# Patient Record
Sex: Female | Born: 1940 | Race: Black or African American | Hispanic: No | Smoking: Never smoker
Health system: Southern US, Community
[De-identification: ages and names within clinical notes are randomized; demographics above are authoritative.]

## PROBLEM LIST (undated history)

## (undated) DIAGNOSIS — Z9221 Personal history of antineoplastic chemotherapy: Secondary | ICD-10-CM

## (undated) DIAGNOSIS — R232 Flushing: Secondary | ICD-10-CM

## (undated) DIAGNOSIS — T7840XA Allergy, unspecified, initial encounter: Secondary | ICD-10-CM

## (undated) DIAGNOSIS — Z923 Personal history of irradiation: Secondary | ICD-10-CM

## (undated) DIAGNOSIS — G5600 Carpal tunnel syndrome, unspecified upper limb: Secondary | ICD-10-CM

## (undated) DIAGNOSIS — D649 Anemia, unspecified: Secondary | ICD-10-CM

## (undated) DIAGNOSIS — E119 Type 2 diabetes mellitus without complications: Secondary | ICD-10-CM

## (undated) DIAGNOSIS — K219 Gastro-esophageal reflux disease without esophagitis: Secondary | ICD-10-CM

## (undated) DIAGNOSIS — S92909A Unspecified fracture of unspecified foot, initial encounter for closed fracture: Secondary | ICD-10-CM

## (undated) DIAGNOSIS — I10 Essential (primary) hypertension: Secondary | ICD-10-CM

## (undated) DIAGNOSIS — M1990 Unspecified osteoarthritis, unspecified site: Secondary | ICD-10-CM

## (undated) DIAGNOSIS — C50919 Malignant neoplasm of unspecified site of unspecified female breast: Secondary | ICD-10-CM

## (undated) HISTORY — PX: ABDOMINAL HYSTERECTOMY: SHX81

## (undated) HISTORY — DX: Type 2 diabetes mellitus without complications: E11.9

## (undated) HISTORY — DX: Essential (primary) hypertension: I10

## (undated) HISTORY — PX: TONSILECTOMY/ADENOIDECTOMY WITH MYRINGOTOMY: SHX6125

## (undated) HISTORY — PX: CARPAL TUNNEL RELEASE: SHX101

## (undated) HISTORY — DX: Allergy, unspecified, initial encounter: T78.40XA

## (undated) HISTORY — DX: Unspecified osteoarthritis, unspecified site: M19.90

## (undated) HISTORY — PX: TUBAL LIGATION: SHX77

## (undated) HISTORY — DX: Anemia, unspecified: D64.9

## (undated) HISTORY — DX: Gastro-esophageal reflux disease without esophagitis: K21.9

## (undated) HISTORY — PX: KNEE SURGERY: SHX244

## (undated) HISTORY — DX: Carpal tunnel syndrome, unspecified upper limb: G56.00

## (undated) HISTORY — DX: Personal history of irradiation: Z92.3

## (undated) HISTORY — DX: Flushing: R23.2

## (undated) HISTORY — DX: Unspecified fracture of unspecified foot, initial encounter for closed fracture: S92.909A

## (undated) HISTORY — PX: OVARY SURGERY: SHX727

## (undated) HISTORY — PX: BACK SURGERY: SHX140

---

## 1995-05-27 ENCOUNTER — Encounter: Payer: Self-pay | Admitting: Gastroenterology

## 1995-06-28 ENCOUNTER — Encounter: Payer: Self-pay | Admitting: Gastroenterology

## 1997-12-19 ENCOUNTER — Other Ambulatory Visit: Admission: RE | Admit: 1997-12-19 | Discharge: 1997-12-19 | Payer: Self-pay | Admitting: Obstetrics and Gynecology

## 1999-03-12 ENCOUNTER — Encounter: Payer: Self-pay | Admitting: Internal Medicine

## 2000-09-01 ENCOUNTER — Ambulatory Visit (HOSPITAL_COMMUNITY): Admission: RE | Admit: 2000-09-01 | Discharge: 2000-09-01 | Payer: Self-pay | Admitting: Internal Medicine

## 2001-03-09 ENCOUNTER — Encounter: Payer: Self-pay | Admitting: Internal Medicine

## 2001-03-09 ENCOUNTER — Encounter: Admission: RE | Admit: 2001-03-09 | Discharge: 2001-03-09 | Payer: Self-pay | Admitting: Internal Medicine

## 2003-12-05 ENCOUNTER — Encounter: Admission: RE | Admit: 2003-12-05 | Discharge: 2003-12-05 | Payer: Self-pay | Admitting: Internal Medicine

## 2005-01-08 ENCOUNTER — Encounter: Admission: RE | Admit: 2005-01-08 | Discharge: 2005-01-08 | Payer: Self-pay | Admitting: Internal Medicine

## 2007-05-18 ENCOUNTER — Encounter: Admission: RE | Admit: 2007-05-18 | Discharge: 2007-05-18 | Payer: Self-pay | Admitting: Internal Medicine

## 2008-05-23 ENCOUNTER — Encounter: Admission: RE | Admit: 2008-05-23 | Discharge: 2008-05-23 | Payer: Self-pay | Admitting: Internal Medicine

## 2009-02-28 ENCOUNTER — Encounter (INDEPENDENT_AMBULATORY_CARE_PROVIDER_SITE_OTHER): Payer: Self-pay | Admitting: *Deleted

## 2009-06-10 ENCOUNTER — Encounter (INDEPENDENT_AMBULATORY_CARE_PROVIDER_SITE_OTHER): Payer: Self-pay | Admitting: *Deleted

## 2009-06-19 ENCOUNTER — Encounter: Admission: RE | Admit: 2009-06-19 | Discharge: 2009-06-19 | Payer: Self-pay | Admitting: Internal Medicine

## 2009-07-04 ENCOUNTER — Ambulatory Visit: Payer: Self-pay | Admitting: Internal Medicine

## 2009-07-04 DIAGNOSIS — Z8711 Personal history of peptic ulcer disease: Secondary | ICD-10-CM

## 2009-07-04 DIAGNOSIS — K219 Gastro-esophageal reflux disease without esophagitis: Secondary | ICD-10-CM | POA: Insufficient documentation

## 2009-07-04 DIAGNOSIS — K222 Esophageal obstruction: Secondary | ICD-10-CM

## 2009-07-04 DIAGNOSIS — D509 Iron deficiency anemia, unspecified: Secondary | ICD-10-CM

## 2009-07-04 DIAGNOSIS — E119 Type 2 diabetes mellitus without complications: Secondary | ICD-10-CM

## 2009-10-29 ENCOUNTER — Telehealth (INDEPENDENT_AMBULATORY_CARE_PROVIDER_SITE_OTHER): Payer: Self-pay | Admitting: *Deleted

## 2010-07-08 NOTE — Procedures (Signed)
Summary: [facility]  E[facility]   Imported By: Sherian Rein 07/05/2009 09:07:34  _____________________________________________________________________  External Attachment:    Type:   Image     Comment:   External Document

## 2010-07-08 NOTE — Progress Notes (Signed)
Summary: Schedule recall colonoscopy  Phone Note Outgoing Call Call back at [facility] Phone [phone]   Call placed by: Christie Nottingham CMA Duncan Dull),  Oct 29, 2009 3:19 PM Call placed to: Patient Summary of Call: Called pt to schedule recall colonoscopy and EGD per last office note in January. Pt states she just had eye surgery for macular degeneration and she cannot have the procedures right now but she will call back soon to schedule after she has recoverd from the surgery.  Initial call taken by: Christie Nottingham CMA Duncan Dull),  Oct 29, 2009 3:21 PM

## 2010-07-08 NOTE — Assessment & Plan Note (Signed)
Summary: COLONOSCOPY / GERD / history PUD   History of Present Illness Visit Type: Initial Consult Primary GI MD: Yancey Flemings MD Primary Provider: Iven Finn Requesting Provider: Iven Finn Chief Complaint: Screening for recall colonoscopy / history of ulcers / mild anemia History of Present Illness:   70 year old African American female with multiple medical problems including hypertension, hyperlipidemia, hypothyroidism, type 2 diabetes mellitus, gastroesophageal reflux disease complicated by peptic stricture requiring esophageal dilation, and irritable bowel syndrome. She was last evaluated in this office in April 2004. At that time, her reflux disease was well controlled on Protonix. Her irritable bowel syndrome was quiescent. She presents today regarding repeat screening colonoscopy. The patient last underwent colonoscopy and upper endoscopy in October of 2000. Upper endoscopy revealed a ringlike stricture which was dilated. Colonoscopy revealed sigmoid diverticulosis and internal hemorrhoids. The patient's GI review of systems is remarkable for multiple intermittent complaints. She uses Nexium sporadically with intermittent problems with heartburn or regurgitation. No recurrent dysphagia. Intermittent problems with abdominal bloating gas and diarrhea. No GI bleeding. She does questions regarding the use of NSAIDs and her history of peptic ulcer disease. 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 No further details. Review of outside laboratories from June 03, 2009 revealed microcytic anemia with a hematocrit of 36.3. She denies melena or hematochezia. She does use ibuprofen and meloxicam for her arthritis. Laboratory from 2003 reveal a similar hemoglobin and MCV.   GI Review of Systems    Reports acid reflux, belching, bloating, heartburn, and  nausea.      Denies abdominal pain, chest pain, dysphagia with liquids, dysphagia with  solids, loss of appetite, vomiting, vomiting blood, weight loss, and  weight gain.      Reports diarrhea, diverticulosis, and  irritable bowel syndrome.     Denies anal fissure, black tarry stools, change in bowel habit, constipation, fecal incontinence, heme positive stool, hemorrhoids, jaundice, light color stool, liver problems, rectal bleeding, and  rectal pain.    Current Medications (verified): 1)  Ortho-Est 0.625 0.75 Mg Tabs (Estropipate) .... 1/2 Tablet By Mouth Once Daily 2)  Lisinopril-Hydrochlorothiazide 20-12.5 Mg Tabs (Lisinopril-Hydrochlorothiazide) .Marland Kitchen.. 1 By Mouth Once Daily 3)  Metformin Hcl 500 Mg Tabs (Metformin Hcl) .Marland Kitchen.. 1 By Mouth Once Daily 4)  Lipitor 40 Mg Tabs (Atorvastatin Calcium) .Marland Kitchen.. 1 By Mouth Once Daily 5)  Amlodipine Besylate 5 Mg Tabs (Amlodipine Besylate) .Marland Kitchen.. 1 By Mouth Once Daily 6)  Xalatan 0.005 % Soln (Latanoprost) .Marland Kitchen.. 1 Gtt Per Eye Once Daily 7)  Nexium 40 Mg Cpdr (Esomeprazole Magnesium) .... As Needed 8)  Fexofenadine Hcl 180 Mg Tabs (Fexofenadine Hcl) .... As Needed 9)  Meloxicam 15 Mg Tabs (Meloxicam) .Marland Kitchen.. 1 By Mouth Once Daily 10)  Aspirin 81 Mg Tbec (Aspirin) .Marland Kitchen.. 1 By Mouth Once Daily 11)  Ibuprofen 200 Mg Tabs (Ibuprofen) .... 2 By Mouth Four Times Daily 12)  Magnesium 250 Mg Tabs (Magnesium) .Marland Kitchen.. 1 By Mouth Once Daily  Allergies (verified): 1)  ! * Tb 2)  ! * Horse Serum  Past History:  Past Medical History: Arthritis Chronic Headaches Hyperlipidemia Hypertension Hypothyroidism GERD Diverticulosis Hemorrhoids Irritable Bowel Syndrome Esophageal Stricture Diabetes Glaucoma ulcers in GI tract 1965 Urinary Tract Infection 1965  Family History: Family History of Diabetes: mother, grandmtother,brother Family History of Irritable Bowel Syndrome:  Social History: Occupation: retired widowed Patient has never smoked.  Alcohol Use - no Illicit Drug Use - no Daily Caffeine Use  cokes Smoking Status:  never Drug Use:   no  Review of Systems       sinus had allergy trouble, arthritis, back pain, visual change, cough, night sweats, sleeping problems. All other systems reviewed and reported as negative  Vital Signs:  Patient profile:   70 year old female Height:      62 inches Weight:      224 pounds BMI:     41.12 BSA:     2.01 Pulse rate:   88 / minute Pulse rhythm:   regular BP sitting:   140 / 88  (left arm)  Vitals Entered By: Merri Ray CMA Duncan Dull) (July 04, 2009 10:05 AM)  Physical Exam  General:  Well developed, obese,well nourished, no acute distress. Head:  Normocephalic and atraumatic. Eyes:  PERRLA, no icterus.cataract present Nose:  No deformity, discharge,  or lesions. Mouth:  No deformity or lesions,  Neck:  Supple; no masses or thyromegaly. Lungs:  Clear throughout to auscultation. Heart:  Regular rate and rhythm; no murmurs, rubs,  or bruits. Abdomen:  Soft, obese,nontender and nondistended. No masses, hepatosplenomegaly or hernias noted. Normal bowel sounds. Rectal:  deferred until procedure Msk:  Symmetrical with no gross deformities. Normal posture. Pulses:  Normal pulses noted. Extremities:  No clubbing, cyanosis, edema or deformities noted. Neurologic:  Alert and  oriented x4;  grossly normal neurologically. Skin:  Intact without significant lesions or rashes. Cervical Nodes:  No significant cervical adenopathy.no supraclavicular adenopathy Psych:  Alert and cooperative. Normal mood and affect.   Impression & Recommendations:  Problem # 1:  ANEMIA-IRON DEFICIENCY (ICD-280.9) Mild microcytic anemia. Rule out occult GI mucosal lesion such as colorectal neoplasia, AVMs, or more likely NSAID induced lesions.  Plan: #1. Colonoscopy. The nature procedure as well as the risks, benefits, and alternatives were reviewed. She understood and agreed to proceed. #2. Movi prep to be prescribed. #3. Metoclopramide 10 mg before each session to be prescribed #4. Upper  endoscopy. The patient procedure as well as risks, benefits, and alternatives were reviewed. She understood and agreed to proceed. #5.hold diabetic medications the day of the procedure in order to avoid unwanted hypoglycemia. We will monitor her blood sugar immediately before and after her procedures. #6. The patient wishes to schedule her procedures in a few months. She will contact the office to arrange a visit with our pre-visit nurse.  Problem # 2:  PERSONAL HISTORY OF PEPTIC ULCER DISEASE (ICD-V12.71) This is an important issue in a 70 year old with multiple comorbidities who uses chronic NSAIDs. With her microcytic anemia, GI mucosal lesions secondary to NSAIDs should be excluded. I discussed with her in detail with the literature regarding concurrent use of PPIs with NSAIDs in patient with history of ulcer disease. Recommend following: #1. At the time of upper endoscopy rule out Helicobacter pylori and treat if positive #2. Could minister PPI therapy daily with NSAID use. Consider selective NSAIDs.  Problem # 3:  GERD (ICD-530.81) chronic GERD with a history of peptic stricture. No recurrent dysphagia post dilation.  Plan: #1. Daily PPI therapy #2. Reflux precautions #3. Esophageal dilation p.r.n. recurrent dysphagia  Problem # 4:  DIABETES MELLITUS-TYPE II (ICD-250.00) management a diabetic medications preprocedure. please see #1 above  Problem # 5:  ESOPHAGEAL STRICTURE (ICD-530.3) remains asymptomatic post dilation  Problem # 6:  SCREENING COLORECTAL-CANCER (ICD-V76.51) negative colonoscopy 10 years ago. She is an appropriate candidate for repeat screening at this time. See discussion and plan under #1 above regarding her colonoscopy.  Patient Instructions:  1)  Pt. will call office and schedule Colon/Endo. 2)  The medication list was reviewed and reconciled.  All changed / newly prescribed medications were explained.  A complete medication list was provided to the patient /  caregiver. 3)  Copy: Dr. Juline Patch

## 2010-07-08 NOTE — Procedures (Signed)
Summary: [facility]  [facility]oscopy/[facility]   Imported By: Sherian Rein 07/05/2009 09:09:07  _____________________________________________________________________  External Attachment:    Type:   Image     Comment:   External Document

## 2010-07-08 NOTE — Procedures (Signed)
Summary: EGD/[facility]  EGD/[facility]   Imported By: Sherian Rein 07/05/2009 09:12:39  _____________________________________________________________________  External Attachment:    Type:   Image     Comment:   External Document

## 2010-07-08 NOTE — Procedures (Signed)
Summary: [facility]   Imported By: Sherian Rein 07/05/2009 09:10:43  _____________________________________________________________________  External Attachment:    Type:   Image     Comment:   External Document

## 2010-07-08 NOTE — Letter (Signed)
Summary: New Patient letter  [facility]  [address] [place], [place] [address]   Phone: [phone]  Fax: [phone]       06/10/2009 MRN: [id number]  QUINLAN MCFALL [address] [place], [place]  [address]  [place]  Dear Ms. Daphine Deutscher,  Welcome to the Gastroenterology Division at [facility].    You are scheduled to see Dr.  Marina Goodell on 07-03-2009 at 11am on the 3rd floor at [facility], [address].  We ask that you try to arrive at our office 15 minutes prior to your appointment time to allow for check-in.  We would like you to complete the enclosed self-administered evaluation form prior to your visit and bring it with you on the day of your appointment.  We will review it with you.  Also, please bring a complete list of all your medications or, if you prefer, bring the medication bottles and we will list them.  Please bring your insurance card so that we may make a copy of it.  If your insurance requires a referral to see a specialist, please bring your referral form from your primary care physician.  Co-payments are due at the time of your visit and may be paid by cash, check or credit card.     Your office visit will consist of a consult with your physician (includes a physical exam), any laboratory testing he/she may order, scheduling of any necessary diagnostic testing (e.g. x-ray, ultrasound, CT-scan), and scheduling of a procedure (e.g. Endoscopy, Colonoscopy) if required.  Please allow enough time on your schedule to allow for any/all of these possibilities.    If you cannot keep your appointment, please call [phone] to cancel or reschedule prior to your appointment date.  This allows [place] the opportunity to schedule an appointment for another patient in need of care.  If you do not cancel or reschedule by 5 p.m. the business day prior to your appointment date, you will be charged a $50.00 late cancellation/no-show fee.    Thank you for choosing  [facility] for your medical needs.  We appreciate the opportunity to care for you.  Please visit [place] at our website  to learn more about our practice.                     Sincerely,                                                             The Gastroenterology Division

## 2010-07-15 ENCOUNTER — Encounter (INDEPENDENT_AMBULATORY_CARE_PROVIDER_SITE_OTHER): Payer: Self-pay | Admitting: *Deleted

## 2010-07-24 NOTE — Letter (Signed)
Summary: Pre Visit Letter Revised  [facility]  [address] [place], [place] [address]   Phone: [phone]  Fax: [phone]        07/15/2010 MRN: [id number] Shelley Lawrence [address] [place], [place]  [address]  [place]             Procedure Date:  08/11/2010 @ 10:30   Recall colon-Dr. Marina Goodell   Welcome to the Gastroenterology Division at [facility].    You are scheduled to see a nurse for your pre-procedure visit on 07/29/2010 at 3:30 on the 3rd floor at [facility], [address].  We ask that you try to arrive at our office 15 minutes prior to your appointment time to allow for check-in.  Please take a minute to review the attached form.  If you answer "Yes" to one or more of the questions on the first page, we ask that you call the person listed at your earliest opportunity.  If you answer "No" to all of the questions, please complete the rest of the form and bring it to your appointment.    Your nurse visit will consist of discussing your medical and surgical history, your immediate family medical history, and your medications.   If you are unable to list all of your medications on the form, please bring the medication bottles to your appointment and we will list them.  We will need to be aware of both prescribed and over the counter drugs.  We will need to know exact dosage information as well.    Please be prepared to read and sign documents such as consent forms, a financial agreement, and acknowledgement forms.  If necessary, and with your consent, a friend or relative is welcome to sit-in on the nurse visit with you.  Please bring your insurance card so that we may make a copy of it.  If your insurance requires a referral to see a specialist, please bring your referral form from your primary care physician.  No co-pay is required for this nurse visit.     If you cannot keep your appointment, please call [phone] to cancel or reschedule prior to  your appointment date.  This allows [place] the opportunity to schedule an appointment for another patient in need of care.    Thank you for choosing [facility] for your medical needs.  We appreciate the opportunity to care for you.  Please visit [place] at our website  to learn more about our practice.  Sincerely, The Gastroenterology Division

## 2010-07-28 ENCOUNTER — Encounter (INDEPENDENT_AMBULATORY_CARE_PROVIDER_SITE_OTHER): Payer: Self-pay | Admitting: *Deleted

## 2010-07-29 ENCOUNTER — Encounter: Payer: Self-pay | Admitting: Internal Medicine

## 2010-07-29 ENCOUNTER — Encounter (INDEPENDENT_AMBULATORY_CARE_PROVIDER_SITE_OTHER): Payer: Self-pay | Admitting: *Deleted

## 2010-08-05 NOTE — Miscellaneous (Signed)
Summary: [facility] Previsit/prep  Clinical Lists Changes  Medications: Added new medication of MOVIPREP 100 GM  SOLR (PEG-KCL-NACL-NASULF-NA ASC-C) As per prep instructions. - Signed Added new medication of METOCLOPRAMIDE HCL 10 MG  TABS (METOCLOPRAMIDE HCL) As per prep instructions. - Signed Rx of MOVIPREP 100 GM  SOLR (PEG-KCL-NACL-NASULF-NA ASC-C) As per prep instructions.;  #1 x 0;  Signed;  Entered by: Wyona Almas RN;  Authorized by: Hilarie Fredrickson MD;  Method used: Electronically to [facility] [address] [place], [place], [place]  [address], Ph: [phone], Fax: [phone] Rx of METOCLOPRAMIDE HCL 10 MG  TABS (METOCLOPRAMIDE HCL) As per prep instructions.;  #2 x 0;  Signed;  Entered by: Wyona Almas RN;  Authorized by: Hilarie Fredrickson MD;  Method used: Electronically to [facility] [address] [place], [place], [place]  [address], Ph: [phone], Fax: [phone] Allergies: Changed allergy or adverse reaction from * HORSE SERUM to * HORSE SERUM Changed allergy or adverse reaction from * TB to * TB    Prescriptions: METOCLOPRAMIDE HCL 10 MG  TABS (METOCLOPRAMIDE HCL) As per prep instructions.  #2 x 0   Entered by:   Wyona Almas RN   Authorized by:   Hilarie Fredrickson MD   Signed by:   Wyona Almas RN on 07/29/2010   Method used:   Electronically to        [facility] (retail)       [address]       [place], [place]  [address]       Ph: [phone]       Fax: [phone]   RxID:   [id number] MOVIPREP 100 GM  SOLR (PEG-KCL-NACL-NASULF-NA ASC-C) As per prep instructions.  #1 x 0   Entered by:   Wyona Almas RN   Authorized by:   Hilarie Fredrickson MD   Signed by:   Wyona Almas RN on 07/29/2010   Method used:   Electronically to        [facility] (retail)       [address]       [place], [place]  [address]       Ph: [phone]       Fax: [phone]   RxID:    [id number]

## 2010-08-05 NOTE — Letter (Signed)
Summary: [facility] Instructions  [facility]  [address] [place], [place] [address]   Phone: [phone]  Fax: [phone]       Shelley Lawrence    01-Aug-1940    MRN: [id number]        Procedure Day Dorna Bloom:  Duanne Limerick  08/11/10     Arrival Time:  9:30AM     Procedure Time:  10:30AM     Location of Procedure:                    Juliann Pares _  [facility] (4th Floor)                      PREPARATION FOR COLONOSCOPY WITH MOVIPREP   Starting 5 days prior to your procedure 08/06/10 do not eat nuts, seeds, popcorn, corn, beans, peas,  salads, or any raw vegetables.  Do not take any fiber supplements (e.g. Metamucil, Citrucel, and Benefiber).  THE DAY BEFORE YOUR PROCEDURE         DATE: 08/10/10  DAY: SUNDAY  1.  Drink clear liquids the entire day-NO SOLID FOOD  2.  Do not drink anything colored red or purple.  Avoid juices with pulp.  No orange juice.  3.  Drink at least 64 oz. (8 glasses) of fluid/clear liquids during the day to prevent dehydration and help the prep work efficiently.  CLEAR LIQUIDS INCLUDE: Water Jello Ice Popsicles Tea (sugar ok, no milk/cream) Powdered fruit flavored drinks Coffee (sugar ok, no milk/cream) Gatorade Juice: apple, white grape, white cranberry  Lemonade Clear bullion, consomm, broth Carbonated beverages (any kind) Strained chicken noodle soup Hard Candy                             4.  In the morning, mix first dose of MoviPrep solution:    Empty 1 Pouch A and 1 Pouch B into the disposable container    Add lukewarm drinking water to the top line of the container. Mix to dissolve    Refrigerate (mixed solution should be used within 24 hrs)  Take Reglan at 4:30PM 5.  Begin drinking the prep at 5:00 p.m. The MoviPrep container is divided by 4 marks.   Every 15 minutes drink the solution down to the next mark (approximately 8 oz) until the full liter is complete.   6.  Follow completed prep with 16 oz of clear liquid of  your choice (Nothing red or purple).  Continue to drink clear liquids until bedtime.  7.  Before going to bed, mix second dose of MoviPrep solution:    Empty 1 Pouch A and 1 Pouch B into the disposable container    Add lukewarm drinking water to the top line of the container. Mix to dissolve    Refrigerate  THE DAY OF YOUR PROCEDURE      DATE: 08/11/10  DAY: MONDAY  Take Reglan at 5:00AM Beginning at 5:30AM (5 hours before procedure):         1. Every 15 minutes, drink the solution down to the next mark (approx 8 oz) until the full liter is complete.  2. Follow completed prep with 16 oz. of clear liquid of your choice.    3. You may drink clear liquids until 8:30AM (2 HOURS BEFORE PROCEDURE).   MEDICATION INSTRUCTIONS  Unless otherwise instructed, you should take regular prescription medications with a small sip of water  as early as possible the morning of your procedure.  Diabetic patients - see separate instructions.   Additional medication instructions: Hold Lisinopril/HCTZ the morning of procedure.         OTHER INSTRUCTIONS  You will need a responsible adult at least 69 years of age to accompany you and drive you home.   This person must remain in the waiting room during your procedure.  Wear loose fitting clothing that is easily removed.  Leave jewelry and other valuables at home.  However, you may wish to bring a book to read or  an iPod/MP3 player to listen to music as you wait for your procedure to start.  Remove all body piercing jewelry and leave at home.  Total time from sign-in until discharge is approximately 2-3 hours.  You should go home directly after your procedure and rest.  You can resume normal activities the  day after your procedure.  The day of your procedure you should not:   Drive   Make legal decisions   Operate machinery   Drink alcohol   Return to work  You will receive specific instructions about eating, activities and  medications before you leave.    The above instructions have been reviewed and explained to me by   Wyona Almas RN  July 29, 2010 11:41 AM     I fully understand and can verbalize these instructions _____________________________ Date _________

## 2010-08-05 NOTE — Letter (Signed)
Summary: Diabetic Instructions  [facility]  [address] [place], [place] [address]   Phone: [phone]  Fax: [phone]    Shelley Lawrence August 28, 1940 MRN: [id number]   _x  _   ORAL DIABETIC MEDICATION INSTRUCTIONS  The day before your procedure:   Take your diabetic pill as you do normally  The day of your procedure:   Do not take your diabetic pill    We will check your blood sugar levels during the admission process and again in Recovery before discharging you home  ________________________________________________________________________

## 2010-08-11 ENCOUNTER — Other Ambulatory Visit: Payer: Self-pay | Admitting: Internal Medicine

## 2010-08-11 ENCOUNTER — Encounter: Payer: Self-pay | Admitting: Internal Medicine

## 2010-08-11 ENCOUNTER — Encounter (AMBULATORY_SURGERY_CENTER): Payer: No Typology Code available for payment source | Admitting: Internal Medicine

## 2010-08-11 DIAGNOSIS — Z1211 Encounter for screening for malignant neoplasm of colon: Secondary | ICD-10-CM

## 2010-08-11 DIAGNOSIS — K219 Gastro-esophageal reflux disease without esophagitis: Secondary | ICD-10-CM

## 2010-08-11 DIAGNOSIS — K5730 Diverticulosis of large intestine without perforation or abscess without bleeding: Secondary | ICD-10-CM

## 2010-08-11 DIAGNOSIS — D509 Iron deficiency anemia, unspecified: Secondary | ICD-10-CM

## 2010-08-11 LAB — GLUCOSE, CAPILLARY
Glucose-Capillary: 126 mg/dL — ABNORMAL HIGH (ref 70–99)
Glucose-Capillary: 115 mg/dL — ABNORMAL HIGH (ref 70–99)

## 2010-08-13 ENCOUNTER — Telehealth: Payer: Self-pay | Admitting: Internal Medicine

## 2010-08-18 ENCOUNTER — Telehealth: Payer: Self-pay | Admitting: Internal Medicine

## 2010-08-19 NOTE — Miscellaneous (Signed)
Summary: Orders Update  Clinical Lists Changes  Orders: Added new Test order of TLB-H Pylori Screen Gastric Biopsy (83013-CLOTEST) - Signed 

## 2010-08-19 NOTE — Progress Notes (Signed)
Summary: Med refill  Phone Note Call from Patient Call back at Home Phone [phone]   Caller: Patient Call For: Dr. Marina Goodell Reason for Call: Talk to Nurse Summary of Call: patient needs [place] to write her a 90 day RX for Neixum so she can send it to her mail order pharmacy because the regular pharmacy is too expensive Initial call taken by: [place] Johnson,  August 13, 2010 3:47 PM    Prescriptions: NEXIUM 40 MG CPDR (ESOMEPRAZOLE MAGNESIUM) as needed  #90 x 3   Entered by:   Milford Cage NCMA   Authorized by:   Hilarie Fredrickson MD   Signed by:   Milford Cage NCMA on 08/13/2010   Method used:   Print then Give to Patient   RxID:   [id number]   Appended Document: Med refill called patient to pick up prescription at front desk.

## 2010-08-19 NOTE — Miscellaneous (Signed)
Summary: Helidac for 2 weeks  Clinical Lists Changes  Medications: Added new medication of HELIDAC   MISC (METRONID-TETRACYC-BIS SUBSAL) As directed for 2 weeks Observations: Added new observation of ALLERGY REV: Done (08/15/2010 16:44)

## 2010-08-19 NOTE — Procedures (Signed)
Summary: Colonoscopy  Patient: [name] Note: All result statuses are Final unless otherwise noted.  Tests: (1) Colonoscopy (COL)   COL Colonoscopy           DONE     [facility]     [address]     [place], [place]  [address]          COLONOSCOPY PROCEDURE REPORT          PATIENT:  Shelley Lawrence, Shelley Lawrence  MR#:  [id number]     BIRTHDATE:  Mar 12, 1941, 69 yrs. old  GENDER:  female     ENDOSCOPIST:  Wilhemina Bonito. Eda Keys, MD     REF. BY:  Screening Recall     PROCEDURE DATE:  08/11/2010     PROCEDURE:  Average-risk screening colonoscopy     G0121     ASA CLASS:  Class II     INDICATIONS:  Routine Risk Screening ; negative index exam 2000     MEDICATIONS:   Fentanyl 50 mcg IV, Versed 6 mg IV          DESCRIPTION OF PROCEDURE:   After the risks benefits and     alternatives of the procedure were thoroughly explained, informed     consent was obtained.  Digital rectal exam was performed and     revealed no abnormalities.   The LB 180AL [id number] endoscope was     introduced through the anus and advanced to the cecum, which was     identified by both the appendix and ileocecal valve, without     limitations.Time to cecum = 2:29 min.  The quality of the prep was     excellent, using MoviPrep.  The instrument was then slowly     withdrawn (time = 8:41 min) as the colon was fully examined.     <<PROCEDUREIMAGES>>          FINDINGS:  Moderate diverticulosis was found in the sigmoid colon.     Otherwise normal colonoscopy without polyps, masses, vascular     ectasias, or inflammatory changes.   Retroflexed views in the     rectum revealed internal hemorrhoids.    The scope was then     withdrawn from the patient and the procedure completed.          COMPLICATIONS:  None          ENDOSCOPIC IMPRESSION:     1) Moderate diverticulosis in the sigmoid colon     2) Otherwise normal colonoscopy     3) Internal hemorrhoids     RECOMMENDATIONS:     1) Continue current colorectal  screening recommendations for     "routine risk" patients with a repeat colonoscopy in 10 years.          ______________________________     Wilhemina Bonito. Eda Keys, MD          CC:  Juline Patch, MD; The Patient          n.     eSIGNED:   Wilhemina Bonito. Eda Keys at 08/11/2010 11:16 AM          Alvera Singh, [id number]  Note: An exclamation mark (!) indicates a result that was not dispersed into the flowsheet. Document Creation Date: 08/11/2010 11:16 AM _______________________________________________________________________  (1) Order result status: Final Collection or observation date-time: 08/11/2010 11:07 Requested date-time:  Receipt date-time:  Reported date-time:  Referring Physician:   Ordering Physician: Fransico Setters [phone]) Specimen Source:  Source:  [employer] Order Number: [id number] Lab site:   Appended Document: Colonoscopy    Clinical Lists Changes  Observations: Added new observation of COLONNXTDUE: 08/2020 (08/11/2010 12:07)

## 2010-08-19 NOTE — Procedures (Signed)
Summary: Upper Endoscopy  Patient: Shelley Lawrence Note: All result statuses are Final unless otherwise noted.  Tests: (1) Upper Endoscopy (EGD)   EGD Upper Endoscopy       DONE     [facility]     [address]     [place], [place]  [address]          ENDOSCOPY PROCEDURE REPORT          PATIENT:  [name], [name]  MR#:  [id number]     BIRTHDATE:  1940/09/30, 69 yrs. old  GENDER:  female          ENDOSCOPIST:  Wilhemina Bonito. Eda Keys, MD     Referred by:  Office [phone])          PROCEDURE DATE:  08/11/2010     PROCEDURE:  EGD with biopsy, 130[address]     ASA CLASS:  Class II     INDICATIONS:  GERD, microcytic anemia ; hx PUD          MEDICATIONS:   There was residual sedation effect present from     prior procedure., Fentanyl 50 mcg IV, Versed 3 mg IV     TOPICAL ANESTHETIC:  Exactacain Spray          DESCRIPTION OF PROCEDURE:   After the risks benefits and     alternatives of the procedure were thoroughly explained, informed     consent was obtained.  The LB GIF-H180 [id number] endoscope was     introduced through the mouth and advanced to the second portion of     the duodenum, without limitations.  The instrument was slowly     withdrawn as the mucosa was fully examined.     <<PROCEDUREIMAGES>>          The upper, middle, and distal third of the esophagus were     carefully inspected and no abnormalities were noted. The z-line     was well seen at the GEJ. The endoscope was pushed into the fundus     which was normal including a retroflexed view. The antrum,gastric     body, first and second part of the duodenum were unremarkable.     Retroflexed views revealed no abnormalities.  Clo bx taken given hx     PUD. The scope was then withdrawn from the patient and the     procedure completed.          COMPLICATIONS:  None          ENDOSCOPIC IMPRESSION:     1) Normal EGD     2) GERD          RECOMMENDATIONS:     1) Anti-reflux regimen to be followed     2)  Continue PPI - NEXIUM 40MG ; #90; ONE PO QD; 1 YR REFILLS     3) Rx CLO if positive          ______________________________     Wilhemina Bonito. Eda Keys, MD          CC:  Juline Patch, MD;  The Patient          n.     eSIGNED:   Wilhemina Bonito. Eda Keys at 08/11/2010 11:26 AM          Alvera Singh, [id number]  Note: An exclamation mark (!) indicates a result that was not dispersed into the flowsheet. Document Creation Date: 08/11/2010 11:26 AM _______________________________________________________________________  (1) Order result status: Final Collection or observation  date-time: 08/11/2010 11:20 Requested date-time:  Receipt date-time:  Reported date-time:  Referring Physician:   Ordering Physician: Fransico Setters [phone]) Specimen Source:  Source: [employer] Order Number: [id number] Lab site:

## 2010-08-20 ENCOUNTER — Telehealth: Payer: Self-pay | Admitting: Physician Assistant

## 2010-08-26 NOTE — Progress Notes (Signed)
Summary: Medication  Phone Note From Pharmacy   Caller: [facility] [phone] Call For: Dr. Marina Goodell  Summary of Call: [facility] is not covered by Ins.Marland Kitchen..Marland Kitchenneeds an alternative med Initial call taken by: Karna Christmas,  August 20, 2010 2:30 PM     Appended Document: Medication Spoke with Mike Gip, PA and ok to call in Pylera for patient to take as directed X 10 days.   Clinical Lists Changes  Medications: Added new medication of PYLERA 140-125-125 MG CAPS (BIS SUBCIT-METRONID-TETRACYC) Take as directed for 10 days - Signed Rx of PYLERA 140-125-125 MG CAPS (BIS SUBCIT-METRONID-TETRACYC) Take as directed for 10 days;  #10 days x 0;  Signed;  Entered by: Selinda Michaels RN;  Authorized by: Sammuel Cooper PA-c;  Method used: Electronically to [facility] [address], [place], [place], [place]  [address], Ph: [phone], Fax: [phone]    Prescriptions: PYLERA 140-125-125 MG CAPS (BIS SUBCIT-METRONID-TETRACYC) Take as directed for 10 days  #10 days x 0   Entered by:   Selinda Michaels RN   Authorized by:   Sammuel Cooper PA-c   Signed by:   Selinda Michaels RN on 08/20/2010   Method used:   Electronically to        [facility] (retail)       [address]       [place], [place]  [address]       Ph: [phone]       Fax: [phone]   RxID:   [id number]    Appended Document: Medication Pharmacy calling back and her insurance will not cover Pylera either. Pharmacy suggests breaking down the prescription into individual meds in generic. Please advise  Appended Document: Medication OK, CALL IN TCN 500MG  4 X DAILY X 10 DAYS,AND METRONIDAZOLE 250 MG 4 X 10 DAYS, AND STAY ON HER PPI.  Appended Document: Medication OK, CALL IN TCN 500MG  4 X DAILY X 10 DAYS,AND METRONIDAZOLE 250 MG 4 X 10 DAYS, AND STAY ON HER PPI.   Signed by Sammuel Cooper PA-c on 08/21/2010 at 9:35 AM    Clinical Lists Changes  Medications: Added new  medication of TETRACYCLINE HCL 500 MG CAPS (TETRACYCLINE HCL) Take 1 by mouth four times a day for 10 days - Signed Added new medication of METRONIDAZOLE 250 MG TABS (METRONIDAZOLE) Take 1 by mouth four times a day for 10 days - Signed Rx of TETRACYCLINE HCL 500 MG CAPS (TETRACYCLINE HCL) Take 1 by mouth four times a day for 10 days;  #40 x 0;  Signed;  Entered by: Selinda Michaels RN;  Authorized by: Sammuel Cooper PA-c;  Method used: Electronically to [facility] [address] [place], [place], [place]  [address], Ph: [phone], Fax: [phone] Rx of METRONIDAZOLE 250 MG TABS (METRONIDAZOLE) Take 1 by mouth four times a day for 10 days;  #40 x 0;  Signed;  Entered by: Selinda Michaels RN;  Authorized by: Sammuel Cooper PA-c;  Method used: Electronically to [facility] [address] [place], [place], [place]  [address], Ph: [phone], Fax: [phone]    Prescriptions: METRONIDAZOLE 250 MG TABS (METRONIDAZOLE) Take 1 by mouth four times a day for 10 days  #40 x 0   Entered by:   Selinda Michaels RN   Authorized by:   Sammuel Cooper PA-c   Signed by:   Selinda Michaels RN  on 08/21/2010   Method used:   [facility]        [facility] ([facility]ail[facility]       [address]       [place], [place]  [address]       Ph: [phone]       Fax: [phone]   RxID:   [id number] TETRACYCLINE HCL 500 MG CAPS (TETRACYCLINE HCL) Take 1 by mouth four times a day for 10 days  #40 x 0   Entered by:   Selinda Michaels RN   Authorized by:   Sammuel Cooper PA-c   Signed by:   Selinda Michaels RN on 08/21/2010   Method used:   Electronically to        [facility] (retail)       [address]       [place], [place]  [address]       Ph: [phone]       Fax: [phone]   RxID:   [id number]

## 2010-08-26 NOTE — Progress Notes (Signed)
Summary: rx not in her pharmacy  Phone Note Call from Patient Call back at Home Phone [phone]   Caller: Patient Call For: Dr Marina Goodell Reason for Call: Talk to Nurse Summary of Call: H-Pylori meds are not in her pharmacy please resend. Initial call taken by: Tawni Levy,  August 18, 2010 1:55 PM  Follow-up for Phone Call        called patient and she had called [facility] and Rx. for Helidac was not sent in.  I looked on pathology report and it was instructed by Dr. Marina Goodell for it to be called in.  Not sure how she wants this to be taken.  [facility]  Thanks.    Follow-up by: Milford Cage NCMA,  August 18, 2010 2:51 PM  Additional Follow-up for Phone Call Additional follow up Details #1::        rx was telephoned to [facility]. Additional Follow-up by: Doristine Church RN II,  August 20, 2010 2:05 PM    Prescriptions: HELIDAC   MISC (METRONID-TETRACYC-BIS SUBSAL) As directed for 2 weeks  #qs x 0   Entered by:   Doristine Church RN II   Authorized by:   Hilarie Fredrickson MD   Signed by:   Doristine Church RN II on 08/20/2010   Method used:   Telephoned to ...       [facility] (retail)       [address]       [place], [place]  [address]       Ph: [phone]       Fax: [phone]   RxID:   [id number]

## 2010-08-28 ENCOUNTER — Telehealth: Payer: Self-pay | Admitting: Internal Medicine

## 2010-08-28 NOTE — Telephone Encounter (Signed)
Shelley Lawrence-this was sent to me.  Please review-not sure what to do with it.  Thanks.

## 2010-08-28 NOTE — Telephone Encounter (Signed)
Patient states that she has not been able to get her Tetracycline Rx because the pharmacy does not have it. Called the [place] [facility] pharmacy on [address] and they have the 250mg  dose. Rx called in for patient at the [facility] on [address]. Tetracycline 250mg  po four times a day for 10days. Pt aware.

## 2010-10-29 ENCOUNTER — Telehealth: Payer: Self-pay | Admitting: Internal Medicine

## 2010-10-29 NOTE — Telephone Encounter (Signed)
You could try twice a day PPI x2 weeks, Biaxin 500 mg twice a day x2 weeks, and amoxicillin 1 g twice a day x2 weeks. Doublecheck for allergies

## 2010-10-29 NOTE — Telephone Encounter (Signed)
Will call after 2 pm. Tetracycline was ordered on 08/28/10 for 2 weeks. I don't know if pt delayed starting the drug or what.

## 2010-10-29 NOTE — Telephone Encounter (Signed)
Pt states she was late starting on the medication for the H Pylori but she started taking the tetracycline and flagyl on Sunday. She took it 3 days and was nauseated and throwing up. Pt stopped the med after Tuesday. Pt states she cannot take that medicine. Pt wants to know what else she can take. Please advise.

## 2010-10-30 MED ORDER — AMOXICILLIN 500 MG PO CAPS: 1000.0000 mg | ORAL_CAPSULE | Freq: Two times a day (BID) | ORAL | Status: AC

## 2010-10-30 MED ORDER — CLARITHROMYCIN 500 MG PO TABS: 500.0000 mg | ORAL_TABLET | Freq: Two times a day (BID) | ORAL | Status: DC

## 2010-10-30 MED ORDER — AMOXICILLIN 500 MG PO CAPS: 1000.0000 mg | ORAL_CAPSULE | Freq: Two times a day (BID) | ORAL | Status: DC

## 2010-10-30 MED ORDER — CLARITHROMYCIN 500 MG PO TABS: 500.0000 mg | ORAL_TABLET | Freq: Two times a day (BID) | ORAL | Status: AC

## 2010-10-30 NOTE — Telephone Encounter (Signed)
Addended by: Chrystie Nose R on: 10/30/2010 01:42 PM   Modules accepted: Orders

## 2010-10-30 NOTE — Telephone Encounter (Signed)
Pt aware of Dr. Lamar Sprinkles recommendations and new rx's sent to the pharmacy.

## 2010-11-04 ENCOUNTER — Telehealth: Payer: Self-pay | Admitting: Internal Medicine

## 2010-11-04 NOTE — Telephone Encounter (Signed)
Pt states prescriptions were not at the pharmacy. Biaxin and amoxicillin scripts called to [facility] pharmacy.

## 2011-02-19 ENCOUNTER — Other Ambulatory Visit: Payer: Self-pay | Admitting: Internal Medicine

## 2011-02-19 DIAGNOSIS — Z1231 Encounter for screening mammogram for malignant neoplasm of breast: Secondary | ICD-10-CM

## 2011-02-25 ENCOUNTER — Ambulatory Visit
Admission: RE | Admit: 2011-02-25 | Discharge: 2011-02-25 | Disposition: A | Discharge status: Home / Self Care | Payer: No Typology Code available for payment source | Source: Ambulatory Visit | Attending: Internal Medicine | Admitting: Internal Medicine

## 2011-02-25 DIAGNOSIS — Z1231 Encounter for screening mammogram for malignant neoplasm of breast: Secondary | ICD-10-CM

## 2011-08-11 ENCOUNTER — Telehealth: Payer: Self-pay | Admitting: Internal Medicine

## 2011-08-12 ENCOUNTER — Other Ambulatory Visit: Payer: Self-pay

## 2011-08-12 MED ORDER — ESOMEPRAZOLE MAGNESIUM 40 MG PO CPDR: 40.0000 mg | DELAYED_RELEASE_CAPSULE | Freq: Every day | ORAL | Status: DC

## 2011-08-17 NOTE — Telephone Encounter (Signed)
rx faxed; encounter closed

## 2012-10-28 ENCOUNTER — Telehealth: Payer: Self-pay

## 2012-10-28 MED ORDER — ESOMEPRAZOLE MAGNESIUM 40 MG PO CPDR: 40.0000 mg | DELAYED_RELEASE_CAPSULE | Freq: Every day | ORAL | Status: AC

## 2012-10-28 NOTE — Telephone Encounter (Signed)
Refilled Nexium 

## 2012-11-02 ENCOUNTER — Telehealth: Payer: Self-pay

## 2012-11-02 NOTE — Telephone Encounter (Signed)
Denied Nexium refill request - patient needs office visit for further refills, which was communicated last time I refilled it

## 2012-11-22 ENCOUNTER — Ambulatory Visit (INDEPENDENT_AMBULATORY_CARE_PROVIDER_SITE_OTHER): Payer: Medicare HMO | Admitting: Podiatry

## 2012-11-22 ENCOUNTER — Encounter: Payer: Self-pay | Admitting: Podiatry

## 2012-11-22 VITALS — BP 175/92 | HR 76

## 2012-11-22 DIAGNOSIS — M2040 Other hammer toe(s) (acquired), unspecified foot: Secondary | ICD-10-CM | POA: Insufficient documentation

## 2012-11-22 DIAGNOSIS — M25579 Pain in unspecified ankle and joints of unspecified foot: Secondary | ICD-10-CM | POA: Insufficient documentation

## 2012-11-22 DIAGNOSIS — E119 Type 2 diabetes mellitus without complications: Secondary | ICD-10-CM

## 2012-11-22 DIAGNOSIS — B351 Tinea unguium: Secondary | ICD-10-CM | POA: Insufficient documentation

## 2012-11-22 NOTE — Progress Notes (Signed)
SUBJECTIVE: 72 y.o. year old female presents for diabetic foot care.  Patient has been diagnosed for DM for several years. She is not taking her medication at this time.  Patient's PCP is Dr. Ricki Miller. She has not seen him for over a year. Has plan to visit him this September.  BS was 150 a couple of weeks ago.   REVIEW OF SYSTEMS: Constitutional: negative for anorexia, chills, fatigue, fevers, weight loss and , but gets night sweats since Hysterectomy. 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 Pain but can walk ok. Neurological: negative Allergic/Immunologic: Grass and leaves, mildew.   OBJECTIVE: DERMATOLOGIC EXAMINATION: Nails: Thick deformed nails x 10.  Abnormal tissue growth at 2nd toe nail above lateral 1/2 of nail plate. Calluses: Plantar medial first MPJ bilateral L>R.  VASCULAR EXAMINATION OF LOWER LIMBS: Pedal pulses: All pedal pulses are palpable with normal pulsation.  No edema or ischemic changes noted.  Temperature gradient from tibial crest to dorsum of foot is within normal bilateral.  NEUROLOGIC EXAMINATION OF THE LOWER LIMBS: Achilles DTR is present and within normal. Monofilament (Semmes-Weinstein 10-gm) sensory testing positive 6 out of 6, bilateral. Vibratory sensations(128Hz  turning fork) intact at medial and lateral forefoot bilateral.  Sharp and Dull discriminatory sensations at the plantar ball of hallux is intact bilateral.   MUSCULOSKELETAL EXAMINATION: HAV with bunion bilateral. Hammer toes 2-4 bilateral, 2nd is severe.   ASSESSMENT: Onychomycosis x 10.  NIDDM not controlled. Painful toes 2nd bilateral.  PLAN: Palliation prn. Debrided all nails x 10.

## 2013-02-22 ENCOUNTER — Ambulatory Visit: Payer: Medicare HMO | Admitting: Podiatry

## 2013-07-07 ENCOUNTER — Other Ambulatory Visit: Payer: Self-pay

## 2013-07-07 DIAGNOSIS — Z1231 Encounter for screening mammogram for malignant neoplasm of breast: Secondary | ICD-10-CM

## 2013-07-26 ENCOUNTER — Ambulatory Visit
Admission: RE | Admit: 2013-07-26 | Discharge: 2013-07-26 | Disposition: A | Discharge status: Home / Self Care | Payer: Commercial Managed Care - HMO | Source: Ambulatory Visit

## 2013-07-26 DIAGNOSIS — Z1231 Encounter for screening mammogram for malignant neoplasm of breast: Secondary | ICD-10-CM

## 2013-07-27 ENCOUNTER — Other Ambulatory Visit: Payer: Self-pay | Admitting: Internal Medicine

## 2013-07-27 DIAGNOSIS — R928 Other abnormal and inconclusive findings on diagnostic imaging of breast: Secondary | ICD-10-CM

## 2013-08-02 ENCOUNTER — Ambulatory Visit
Admission: RE | Admit: 2013-08-02 | Discharge: 2013-08-02 | Disposition: A | Discharge status: Home / Self Care | Payer: Commercial Managed Care - HMO | Source: Ambulatory Visit | Attending: Internal Medicine | Admitting: Internal Medicine

## 2013-08-02 DIAGNOSIS — R928 Other abnormal and inconclusive findings on diagnostic imaging of breast: Secondary | ICD-10-CM

## 2014-02-13 ENCOUNTER — Other Ambulatory Visit: Payer: Self-pay | Admitting: Internal Medicine

## 2014-02-13 DIAGNOSIS — N63 Unspecified lump in unspecified breast: Secondary | ICD-10-CM

## 2014-02-22 ENCOUNTER — Ambulatory Visit
Admission: RE | Admit: 2014-02-22 | Discharge: 2014-02-22 | Disposition: A | Discharge status: Home / Self Care | Payer: Commercial Managed Care - HMO | Source: Ambulatory Visit | Attending: Internal Medicine | Admitting: Internal Medicine

## 2014-02-22 ENCOUNTER — Encounter (INDEPENDENT_AMBULATORY_CARE_PROVIDER_SITE_OTHER): Payer: Self-pay

## 2014-02-22 DIAGNOSIS — N63 Unspecified lump in unspecified breast: Secondary | ICD-10-CM

## 2014-07-19 ENCOUNTER — Other Ambulatory Visit: Payer: Self-pay

## 2014-07-19 DIAGNOSIS — Z1231 Encounter for screening mammogram for malignant neoplasm of breast: Secondary | ICD-10-CM

## 2014-07-30 ENCOUNTER — Ambulatory Visit
Admission: RE | Admit: 2014-07-30 | Discharge: 2014-07-30 | Disposition: A | Discharge status: Home / Self Care | Payer: PPO | Source: Ambulatory Visit

## 2014-07-30 DIAGNOSIS — Z1231 Encounter for screening mammogram for malignant neoplasm of breast: Secondary | ICD-10-CM

## 2015-07-29 DIAGNOSIS — M170 Bilateral primary osteoarthritis of knee: Secondary | ICD-10-CM | POA: Diagnosis not present

## 2015-07-29 DIAGNOSIS — M1711 Unilateral primary osteoarthritis, right knee: Secondary | ICD-10-CM | POA: Diagnosis not present

## 2015-08-12 DIAGNOSIS — M1712 Unilateral primary osteoarthritis, left knee: Secondary | ICD-10-CM | POA: Diagnosis not present

## 2015-09-16 DIAGNOSIS — M170 Bilateral primary osteoarthritis of knee: Secondary | ICD-10-CM | POA: Diagnosis not present

## 2015-09-19 ENCOUNTER — Other Ambulatory Visit: Payer: Self-pay

## 2015-09-19 DIAGNOSIS — Z1231 Encounter for screening mammogram for malignant neoplasm of breast: Secondary | ICD-10-CM

## 2015-09-24 DIAGNOSIS — M170 Bilateral primary osteoarthritis of knee: Secondary | ICD-10-CM | POA: Diagnosis not present

## 2015-09-25 ENCOUNTER — Ambulatory Visit
Admission: RE | Admit: 2015-09-25 | Discharge: 2015-09-25 | Disposition: A | Discharge status: Home / Self Care | Payer: PPO | Source: Ambulatory Visit

## 2015-09-25 DIAGNOSIS — Z1231 Encounter for screening mammogram for malignant neoplasm of breast: Secondary | ICD-10-CM | POA: Diagnosis not present

## 2015-09-26 DIAGNOSIS — E7800 Pure hypercholesterolemia, unspecified: Secondary | ICD-10-CM | POA: Diagnosis not present

## 2015-09-26 DIAGNOSIS — E1122 Type 2 diabetes mellitus with diabetic chronic kidney disease: Secondary | ICD-10-CM | POA: Diagnosis not present

## 2015-09-26 DIAGNOSIS — I10 Essential (primary) hypertension: Secondary | ICD-10-CM | POA: Diagnosis not present

## 2015-09-30 DIAGNOSIS — M170 Bilateral primary osteoarthritis of knee: Secondary | ICD-10-CM | POA: Diagnosis not present

## 2015-10-02 ENCOUNTER — Other Ambulatory Visit: Payer: Self-pay | Admitting: Internal Medicine

## 2015-10-02 DIAGNOSIS — R928 Other abnormal and inconclusive findings on diagnostic imaging of breast: Secondary | ICD-10-CM

## 2015-10-03 DIAGNOSIS — E119 Type 2 diabetes mellitus without complications: Secondary | ICD-10-CM | POA: Diagnosis not present

## 2015-10-03 DIAGNOSIS — M859 Disorder of bone density and structure, unspecified: Secondary | ICD-10-CM | POA: Diagnosis not present

## 2015-10-03 DIAGNOSIS — I10 Essential (primary) hypertension: Secondary | ICD-10-CM | POA: Diagnosis not present

## 2015-10-03 DIAGNOSIS — E7800 Pure hypercholesterolemia, unspecified: Secondary | ICD-10-CM | POA: Diagnosis not present

## 2015-10-09 ENCOUNTER — Ambulatory Visit
Admission: RE | Admit: 2015-10-09 | Discharge: 2015-10-09 | Disposition: A | Discharge status: Home / Self Care | Payer: PPO | Source: Ambulatory Visit | Attending: Internal Medicine | Admitting: Internal Medicine

## 2015-10-09 ENCOUNTER — Other Ambulatory Visit: Payer: Self-pay | Admitting: Internal Medicine

## 2015-10-09 DIAGNOSIS — N63 Unspecified lump in breast: Secondary | ICD-10-CM | POA: Diagnosis not present

## 2015-10-09 DIAGNOSIS — R928 Other abnormal and inconclusive findings on diagnostic imaging of breast: Secondary | ICD-10-CM

## 2015-10-09 DIAGNOSIS — N6310 Unspecified lump in the right breast, unspecified quadrant: Secondary | ICD-10-CM

## 2015-10-14 ENCOUNTER — Ambulatory Visit
Admission: RE | Admit: 2015-10-14 | Discharge: 2015-10-14 | Disposition: A | Discharge status: Home / Self Care | Payer: PPO | Source: Ambulatory Visit | Attending: Internal Medicine | Admitting: Internal Medicine

## 2015-10-14 ENCOUNTER — Other Ambulatory Visit: Payer: Self-pay | Admitting: Internal Medicine

## 2015-10-14 DIAGNOSIS — N6310 Unspecified lump in the right breast, unspecified quadrant: Secondary | ICD-10-CM

## 2015-10-14 DIAGNOSIS — N63 Unspecified lump in breast: Secondary | ICD-10-CM | POA: Diagnosis not present

## 2015-10-14 DIAGNOSIS — C50211 Malignant neoplasm of upper-inner quadrant of right female breast: Secondary | ICD-10-CM | POA: Diagnosis not present

## 2015-10-14 DIAGNOSIS — C50919 Malignant neoplasm of unspecified site of unspecified female breast: Secondary | ICD-10-CM

## 2015-10-14 HISTORY — DX: Malignant neoplasm of unspecified site of unspecified female breast: C50.919

## 2015-10-14 HISTORY — PX: BREAST BIOPSY: SHX20

## 2015-10-16 ENCOUNTER — Telehealth: Payer: Self-pay | Admitting: *Deleted

## 2015-10-16 ENCOUNTER — Other Ambulatory Visit: Payer: Self-pay | Admitting: *Deleted

## 2015-10-16 DIAGNOSIS — C50211 Malignant neoplasm of upper-inner quadrant of right female breast: Secondary | ICD-10-CM | POA: Insufficient documentation

## 2015-10-16 NOTE — Telephone Encounter (Signed)
Confirmed [facility] for 10/23/15 at 1215pm.    Instructions and contact information given.

## 2015-10-23 ENCOUNTER — Other Ambulatory Visit (HOSPITAL_BASED_OUTPATIENT_CLINIC_OR_DEPARTMENT_OTHER): Payer: PPO

## 2015-10-23 ENCOUNTER — Ambulatory Visit
Admission: RE | Admit: 2015-10-23 | Discharge: 2015-10-23 | Disposition: A | Discharge status: Home / Self Care | Payer: PPO | Source: Ambulatory Visit | Attending: Radiation Oncology | Admitting: Radiation Oncology

## 2015-10-23 ENCOUNTER — Encounter: Payer: Self-pay | Admitting: Hematology and Oncology

## 2015-10-23 ENCOUNTER — Encounter: Payer: Self-pay | Admitting: General Practice

## 2015-10-23 ENCOUNTER — Other Ambulatory Visit: Payer: Self-pay | Admitting: General Surgery

## 2015-10-23 ENCOUNTER — Encounter: Payer: Self-pay | Admitting: Physical Therapy

## 2015-10-23 ENCOUNTER — Encounter: Payer: Self-pay | Admitting: Nurse Practitioner

## 2015-10-23 ENCOUNTER — Ambulatory Visit (HOSPITAL_BASED_OUTPATIENT_CLINIC_OR_DEPARTMENT_OTHER): Payer: PPO | Admitting: Hematology and Oncology

## 2015-10-23 ENCOUNTER — Ambulatory Visit: Payer: PPO | Attending: General Surgery | Admitting: Physical Therapy

## 2015-10-23 VITALS — BP 153/84 | HR 87 | Temp 99.1°F | Resp 18 | Ht 62.0 in | Wt 225.4 lb

## 2015-10-23 DIAGNOSIS — C50211 Malignant neoplasm of upper-inner quadrant of right female breast: Secondary | ICD-10-CM

## 2015-10-23 DIAGNOSIS — M25562 Pain in left knee: Secondary | ICD-10-CM

## 2015-10-23 DIAGNOSIS — Z9181 History of falling: Secondary | ICD-10-CM

## 2015-10-23 DIAGNOSIS — M25561 Pain in right knee: Secondary | ICD-10-CM

## 2015-10-23 DIAGNOSIS — R293 Abnormal posture: Secondary | ICD-10-CM | POA: Diagnosis not present

## 2015-10-23 DIAGNOSIS — Z170 Estrogen receptor positive status [ER+]: Secondary | ICD-10-CM | POA: Diagnosis not present

## 2015-10-23 DIAGNOSIS — R262 Difficulty in walking, not elsewhere classified: Secondary | ICD-10-CM

## 2015-10-23 LAB — CBC WITH DIFFERENTIAL/PLATELET
BASO%: 0.5 % (ref 0.0–2.0)
Basophils Absolute: 0 10*3/uL (ref 0.0–0.1)
EOS ABS: 0.1 10*3/uL (ref 0.0–0.5)
EOS%: 1.6 % (ref 0.0–7.0)
HEMATOCRIT: 34.4 % — AB (ref 34.8–46.6)
HGB: 11 g/dL — ABNORMAL LOW (ref 11.6–15.9)
LYMPH%: 21.8 % (ref 14.0–49.7)
MCH: 24.8 pg — ABNORMAL LOW (ref 25.1–34.0)
MCHC: 32 g/dL (ref 31.5–36.0)
MCV: 77.4 fL — ABNORMAL LOW (ref 79.5–101.0)
MONO#: 0.8 10*3/uL (ref 0.1–0.9)
MONO%: 9 % (ref 0.0–14.0)
NEUT%: 67.1 % (ref 38.4–76.8)
NEUTROS ABS: 5.8 10*3/uL (ref 1.5–6.5)
Platelets: 273 10*3/uL (ref 145–400)
RBC: 4.44 10*6/uL (ref 3.70–5.45)
RDW: 14.7 % — AB (ref 11.2–14.5)
WBC: 8.7 10*3/uL (ref 3.9–10.3)
lymph#: 1.9 10*3/uL (ref 0.9–3.3)

## 2015-10-23 LAB — COMPREHENSIVE METABOLIC PANEL
ALT: 15 U/L (ref 0–55)
AST: 17 U/L (ref 5–34)
Albumin: 3.7 g/dL (ref 3.5–5.0)
Alkaline Phosphatase: 94 U/L (ref 40–150)
Anion Gap: 7 mEq/L (ref 3–11)
BUN: 14.3 mg/dL (ref 7.0–26.0)
CALCIUM: 9.6 mg/dL (ref 8.4–10.4)
CHLORIDE: 105 meq/L (ref 98–109)
CO2: 30 meq/L — AB (ref 22–29)
Creatinine: 0.6 mg/dL (ref 0.6–1.1)
EGFR: >90 mL/min/{1.73_m2} (ref >90)
Glucose: 144 mg/dl — ABNORMAL HIGH (ref 70–140)
POTASSIUM: 3.2 meq/L — AB (ref 3.5–5.1)
SODIUM: 142 meq/L (ref 136–145)
Total Bilirubin: 0.32 mg/dL (ref 0.20–1.20)
Total Protein: 6.8 g/dL (ref 6.4–8.3)

## 2015-10-23 NOTE — Therapy (Signed)
[place], [place], [address] Phone: [phone]   Fax:  [phone]  Physical Therapy Evaluation  Patient Details  Name: NOVICE VRBA MRN: [id number] Date of Birth: September 05, 1940 Referring Provider: Dr. Autumn Messing  Encounter Date: 10/23/2015      PT End of Session - 10/23/15 1711    Visit Number 1   Number of Visits 1   PT Start Time 1330   PT Stop Time 8032  Also saw pt from 1400-1425 for a total of 40 minutes   PT Time Calculation (min) 15 min   Activity Tolerance Patient tolerated treatment well   Behavior During Therapy [facility] for tasks assessed/performed      Past Medical History  Diagnosis Date  . Hypertension   . Diabetes mellitus without complication ([place])   . Arthritis   . GERD (gastroesophageal reflux disease)   . Allergy   . Anemia   . Hot flashes   . Broken foot   . Carpal tunnel syndrome     Past Surgical History  Procedure Laterality Date  . Tonsilectomy/adenoidectomy with myringotomy    . Abdominal hysterectomy    . Tubal ligation    . Back surgery    . Carpal tunnel release Right   . Ovary surgery      removal of ovarian tumor  . Knee surgery      There were no vitals filed for this visit.       Subjective Assessment - 10/23/15 1654    Subjective Patient reports she is here today for an assessment of her newly diagnosed right breast cancer.  She also reported being interested in PT to decrease her dependence on her cane and improve overall mobility.     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  It is located in the upper inner quadrant and measures 1.1 cm with a Ki67 of 60%.  It is ER positive and PR negative.  She reports she needs bilateral  total knee replacements but that is on hold due to her cancer.   Patient Stated Goals Reduce lymphedema risk and learn post op shoulder ROM HEP; become more  mobile and less dependent on my cane   Currently in Pain? Yes   Pain Score 10-Worst pain ever   Pain Location Knee   Pain Orientation Right;Left   Pain Descriptors / Indicators Aching;Constant;Sore   Pain Type Chronic pain   Pain Onset More than a month ago   Pain Frequency Constant   Aggravating Factors  Walking   Pain Relieving Factors Resting   Multiple Pain Sites No            [facility] PT Assessment - 10/23/15 0001    Assessment   Medical Diagnosis Right breast cancer   Referring Provider Dr. Autumn Messing   Onset Date/Surgical Date 09/25/15   Hand Dominance Right   Prior Therapy none   Precautions   Precautions Fall;Other (comment)  Active cancer   Restrictions   Weight Bearing Restrictions No   Balance Screen   Has the patient fallen in the past 6 months Yes   How many times? 1   Has the patient had a decrease in activity level because of a fear of falling?  Yes   Is the patient reluctant to leave their home because of a fear of falling?  No   Home Ecologist residence  Living Arrangements Alone   Available Help at Discharge Other (Comment)  "I don't need anyone"   Prior Function   Level of Independence Independent with community mobility with device   Vocation Retired   Leisure She does not exercise   Cognition   Overall Cognitive Status Within Functional Limits for tasks assessed   Functional Tests   Functional tests Sit to Stand   Sit to Stand   Comments Able to perform sit to stand 12x in 30 seconds from chair   Posture/Postural Control   Posture/Postural Control Postural limitations   Postural Limitations Rounded Shoulders;Forward head   ROM / Strength   AROM / PROM / Strength AROM;Strength   AROM   AROM Assessment Site Shoulder;Cervical   Right/Left Shoulder Right;Left   Right Shoulder Extension 42 Degrees   Right Shoulder Flexion 132 Degrees   Right Shoulder ABduction 123 Degrees   Right Shoulder Internal Rotation 65  Degrees   Right Shoulder External Rotation 72 Degrees   Left Shoulder Extension 57 Degrees   Left Shoulder Flexion 122 Degrees   Left Shoulder ABduction 136 Degrees   Left Shoulder Internal Rotation 55 Degrees   Left Shoulder External Rotation 75 Degrees   Cervical Flexion WNL   Cervical Extension WNL   Cervical - Right Side Bend 25% limited   Cervical - Left Side Bend WNL   Cervical - Right Rotation WNL   Cervical - Left Rotation WNL   Strength   Overall Strength Comments BUE are WFL; BLE not tested   Transfers   Transfers Independent with all Transfers   Ambulation/Gait   Ambulation/Gait Yes   Ambulation Distance (Feet) 20 Feet   Assistive device --  Has single point cane but did not use during gait assessment   Gait Pattern Antalgic;Decreased weight shift to left;Decreased trunk rotation   Ambulation Surface Level   Gait velocity Very slow   Balance   Balance Assessed Yes   Standardized Balance Assessment   Standardized Balance Assessment Berg Balance Test;Five Times Sit to Stand   Five times sit to stand comments  Took 15 seconds   Berg Balance Test   Sit to Stand Able to stand without using hands and stabilize independently   Standing Unsupported Able to stand safely 2 minutes   Sitting with Back Unsupported but Feet Supported on Floor or Stool Able to sit safely and securely 2 minutes   Stand to Sit Controls descent by using hands   Transfers Able to transfer safely, minor use of hands   Standing Unsupported with Eyes Closed Able to stand 10 seconds with supervision   Standing Ubsupported with Feet Together Able to place feet together independently and stand for 1 minute with supervision   From Standing, Reach Forward with Outstretched Arm Can reach forward >12 cm safely (5")   From Standing Position, Pick up Object from Floor Able to pick up shoe safely and easily   From Standing Position, Turn to Look Behind Over each Shoulder Looks behind from both sides and weight  shifts well   Turn 360 Degrees Able to turn 360 degrees safely but slowly   Standing Unsupported, Alternately Place Feet on Step/Stool Able to stand independently and complete 8 steps >20 seconds   Standing Unsupported, One Foot in Front Able to place foot tandem independently and hold 30 seconds   Standing on One Leg Able to lift leg independently and hold 5-10 seconds   Total Score 48  LYMPHEDEMA/ONCOLOGY QUESTIONNAIRE - 10/23/15 1709    Type   Cancer Type Right breast cancer   Lymphedema Assessments   Lymphedema Assessments Upper extremities   Right Upper Extremity Lymphedema   10 cm Proximal to Olecranon Process 34.3 cm   Olecranon Process 25.8 cm   10 cm Proximal to Ulnar Styloid Process 23.2 cm   Just Proximal to Ulnar Styloid Process 16 cm   Across Hand at [employer] 18.5 cm   At [place] of 2nd Digit 6.8 cm   Left Upper Extremity Lymphedema   10 cm Proximal to Olecranon Process 35.7 cm   Olecranon Process 27.2 cm   10 cm Proximal to Ulnar Styloid Process 23.4 cm   Just Proximal to Ulnar Styloid Process 16.1 cm   Across Hand at [employer] 18.7 cm   At [place] of 2nd Digit 6.5 cm        Patient was instructed today in a home exercise program today for post op shoulder range of motion. These included active assist shoulder flexion in sitting, scapular retraction, wall walking with shoulder abduction, and hands behind head external rotation.  She was encouraged to do these twice a day, holding 3 seconds and repeating 5 times when permitted by her physician.                    PT Education - 10/23/15 1711    Education provided Yes   Education Details Lymphedema risk reduction and post op shoulder ROM HEP   Person(s) Educated Patient   Methods Explanation;Demonstration;Handout   Comprehension Returned demonstration;Verbalized understanding              Breast Clinic Goals - 10/23/15 1717    Patient will be able to verbalize  understanding of pertinent lymphedema risk reduction practices relevant to her diagnosis specifically related to skin care.   Time 1   Period Days   Status Achieved   Patient will be able to return demonstrate and/or verbalize understanding of the post-op home exercise program related to regaining shoulder range of motion.   Time 1   Period Days   Status Achieved   Patient will be able to verbalize understanding of the importance of attending the postoperative After Breast Cancer Class for further lymphedema risk reduction education and therapeutic exercise.   Time 1   Period Days   Status Achieved          Long Term Clinic Goals - 10/23/15 1717    CC Long Term Goal  #1   Title Patient will demonstrate safe home exercise program to reduce fall risk and improve balance.   Time 4   Period Weeks   Status New   CC Long Term Goal  #2   Title Patient will increase BERG balance score to >/= 50/56 to reduce fall risk   Baseline 48/56   Time 4   Period Weeks   Status New   CC Long Term Goal  #3   Title Patient will demonstrate improved gait pattern with increased trunk rotation and arm swing with normal weight shift while using cane to reduce stress on left knee.   Time 4   Period Weeks   Status New   CC Long Term Goal  #4   Title Pain will verbalize understanding of pain reduction techniques for bilateral knees.   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  It is located in the upper inner quadrant and measures 1.1 cm with a Ki67 of 60%.  It is ER positive and PR negative.  She reports she needs bilateral  total knee replacements but that is on hold due to her cancer.  She will benefit from physical therapy pre-operatively to improve functional outcomes for after surgery.  She lives alone and refuses anyone to assist her in her house  after surgery.  She is currently ambulating with a straight cane to reduce fall risk and due to knee pain.  She has had multiple orthopedic problems and her current problem with needing bilateral total knee replacements increases her risk of falls. Her overall mobility is reduced due to her knee pain which worsens surgical outcomes.  Due to her comorbidities, balance deficits and living alone, without support, this was a moderately highly complex evaluation.   Rehab Potential Good   Clinical Impairments Affecting Rehab Potential Patient needs total knee replacements   PT Frequency 2x / week   PT Duration 4 weeks   PT Treatment/Interventions ADLs/Self Care Home Management;Electrical Stimulation;Therapeutic exercise;Manual techniques;Therapeutic activities;Functional mobility training;Stair training;Gait training;Patient/family education;Neuromuscular re-education;Balance training   PT Next Visit Plan Begin recumbant bike, high level balance tasks   PT Home Exercise Plan Post op shoulder ROM HEP   Consulted and Agree with Plan of Care Patient      Patient will benefit from skilled therapeutic intervention in order to improve the following deficits and impairments:  Abnormal gait, Decreased activity tolerance, Decreased balance, Decreased mobility, Decreased knowledge of precautions, Decreased endurance, Decreased range of motion, Postural dysfunction, Pain, Impaired UE functional use, Difficulty walking  Visit Diagnosis: Carcinoma of right breast upper inner quadrant ([place]) - Plan: PT plan of care cert/re-cert  Abnormal posture - Plan: PT plan of care cert/re-cert  Difficulty in walking, not elsewhere classified - Plan: PT plan of care cert/re-cert  History of falling - Plan: PT plan of care cert/re-cert  Pain in left knee - Plan: PT plan of care cert/re-cert  Pain in right knee - Plan: PT plan of care cert/re-cert  Patient will follow up at outpatient cancer rehab if needed following  surgery.  If the patient requires physical therapy at that time, a specific plan will be dictated and sent to the referring physician for approval. The patient was educated today on appropriate basic range of motion exercises to begin post operatively and the importance of attending the After Breast Cancer class following surgery.  Patient was educated today on lymphedema risk reduction practices as it pertains to recommendations that will benefit the patient immediately following surgery.  She verbalized good understanding.  She will begin physical therapy next week to address balance and mobility deficits.      G-Codes - 2015/11/08 1720    Functional Assessment Tool Used BERG Balance test   Functional Limitation Mobility: Walking and moving around   Mobility: Walking and Moving Around Current Status [phone]) At least 1 percent but less than 20 percent impaired, limited or restricted   Mobility: Walking and Moving Around Goal Status [phone]) At least 1 percent but less than 20 percent impaired, limited or restricted       Problem List Patient Active Problem List   Diagnosis Date Noted  . Breast cancer of upper-inner quadrant of right female breast ([place]) 10/16/2015  . Onychomycosis 11/22/2012  . Pain  in joint, ankle and foot 11/22/2012  . Hammer toe 11/22/2012  . DIABETES MELLITUS-TYPE II 07/04/2009  . ANEMIA-IRON DEFICIENCY 07/04/2009  . ESOPHAGEAL STRICTURE 07/04/2009  . GERD 07/04/2009  . 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    Annia Friendly, PT 10/23/2015 5:24 PM  [place], [place], [address] Phone: [phone]   Fax:  [phone]  Name: ZAFIRO ROUTSON MRN: [id number] Date of Birth: 03-18-1941

## 2015-10-23 NOTE — Progress Notes (Signed)
[facility] NOTE  Patient Care Team: Jani Gravel, MD as PCP - General (Internal Medicine) Autumn Messing III, MD as Consulting Physician (General Surgery) Nicholas Lose, MD as Consulting Physician (Oncology) Gery Pray, MD as Consulting Physician (Radiation Oncology)  CHIEF COMPLAINTS/PURPOSE OF CONSULTATION:  Newly diagnosed breast cancer  HISTORY OF PRESENTING ILLNESS:  Shelley Lawrence 75 y.o. female is here because of recent diagnosis of right breast cancer. Patient had a screening mammogram that revealed a right breast mass at 1:00 position measuring 1.1 cm. Ultrasound of axilla was negative. Biopsy was performed which showed grade 2 invasive ductal carcinoma with DCIS was ER 100 percent, PR 0%, HER-2 negative ratio 1.64 with a Ki-67 of 60%. She was presented this morning of the multidisciplinary tumor board and she is here today at the [facility] to discuss a treatment plan.  I reviewed her records extensively and collaborated the history with the patient.  SUMMARY OF ONCOLOGIC HISTORY:   Breast cancer of upper-inner quadrant of right female breast ([place])   10/14/2015 Initial Diagnosis Screening detected Right breast mass at 1:00:1.1 x 0.8 x 0.7 cm,grade 2 IDC plus DCIS, ER 100 present, PR 0%, HER-2 negative ratio 1.64, Ki-67 60%, T1cN0 stage IA    In terms of breast cancer risk profile:  She menarched at early age of 71 and went to menopause at age 50  She had no pregnancies  She has not received birth control pills.  She was exposed to fertility medications or hormone replacement therapy for 12 years.  She has no family history of Breast/GYN/GI cancer  MEDICAL HISTORY:  Past Medical History  Diagnosis Date  . Hypertension   . Diabetes mellitus without complication ([place])   . Arthritis   . GERD (gastroesophageal reflux disease)   . Allergy   . Anemia   . Hot flashes   . Broken foot   . Carpal tunnel syndrome     SURGICAL HISTORY: Past Surgical History   Procedure Laterality Date  . Tonsilectomy/adenoidectomy with myringotomy    . Abdominal hysterectomy    . Tubal ligation    . Back surgery    . Carpal tunnel release Right   . Ovary surgery      removal of ovarian tumor  . Knee surgery      SOCIAL HISTORY: Social History   Social History  . Marital Status: Widowed    Spouse Name: N/A  . Number of Children: N/A  . Years of Education: N/A   Occupational History  . Not on file.   Social History Main Topics  . Smoking status: Never Smoker   . Smokeless tobacco: Never Used  . Alcohol Use: No  . Drug Use: No  . Sexual Activity: Not on file   Other Topics Concern  . Not on file   Social History Narrative    FAMILY HISTORY: History reviewed. No pertinent family history.  ALLERGIES:  is allergic to horse-derived products.  MEDICATIONS:  Current Outpatient Prescriptions  Medication Sig Dispense Refill  . aspirin 81 MG tablet Take 81 mg by mouth daily.    Marland Kitchen atorvastatin (LIPITOR) 40 MG tablet Take 40 mg by mouth daily.    Marland Kitchen b complex vitamins capsule Take 1 capsule by mouth daily.    . brimonidine-timolol (COMBIGAN) 0.2-0.5 % ophthalmic solution Place 1 drop into the left eye every 12 (twelve) hours.    . chlorpheniramine (CHLOR-TRIMETON) 4 MG tablet Take by mouth as needed for allergies.    Marland Kitchen  cycloSPORINE (RESTASIS) 0.05 % ophthalmic emulsion Place 1 drop into both eyes 2 (two) times daily.    Marland Kitchen esomeprazole (NEXIUM) 40 MG capsule Take 1 capsule (40 mg total) by mouth daily before breakfast. 30 capsule 1  . estropipate (OGEN) 1.5 MG tablet     . fexofenadine (ALLEGRA) 180 MG tablet Take 180 mg by mouth daily.    . fluticasone (VERAMYST) 27.5 MCG/SPRAY nasal spray Place 2 sprays into the nose daily.    Marland Kitchen ibuprofen (ADVIL,MOTRIN) 400 MG tablet Take 400 mg by mouth every 6 (six) hours as needed.    . latanoprost (XALATAN) 0.005 % ophthalmic solution Place 1 drop into both eyes at bedtime.    Marland Kitchen  lisinopril-hydrochlorothiazide (PRINZIDE,ZESTORETIC) 20-12.5 MG per tablet Take 1 tablet by mouth daily.     . metFORMIN (GLUCOPHAGE-XR) 500 MG 24 hr tablet Take 500 mg by mouth daily with breakfast.     . Multiple Vitamins-Minerals (MULTIVITAMIN WITH MINERALS) tablet Take 1 tablet by mouth daily.    Glory Rosebush VERIO test strip     . Specialty Vitamins Products (MAGNESIUM, AMINO ACID CHELATE,) 133 MG tablet Take 1 tablet by mouth. '250mg'$     . traMADol (ULTRAM) 50 MG tablet Take 50 mg by mouth every 8 (eight) hours as needed.      No current facility-administered medications for this visit.    REVIEW OF SYSTEMS:   Constitutional: Denies fevers, chills or abnormal night sweats Eyes: Denies blurriness of vision, double vision or watery eyes Ears, nose, mouth, throat, and face: Denies mucositis or sore throat Respiratory: Denies cough, dyspnea or wheezes Cardiovascular: Denies palpitation, chest discomfort or lower extremity swelling Gastrointestinal:  Denies nausea, heartburn or change in bowel habits Skin: Denies abnormal skin rashes Lymphatics: Denies new lymphadenopathy or easy bruising Neurological:Denies numbness, tingling or new weaknesses Behavioral/Psych: Mood is stable, no new changes  Breast:  Denies any palpable lumps or discharge All other systems were reviewed with the patient and are negative.  PHYSICAL EXAMINATION: ECOG PERFORMANCE STATUS: 0 - Asymptomatic  Filed Vitals:   10/23/15 1238  BP: 153/84  Pulse: 87  Temp: 99.1 F (37.3 C)  Resp: 18   Filed Weights   10/23/15 1238  Weight: 225 lb 6.4 oz (102.241 kg)    GENERAL:alert, no distress and comfortable SKIN: skin color, texture, turgor are normal, no rashes or significant lesions EYES: normal, conjunctiva are pink and non-injected, sclera clear OROPHARYNX:no exudate, no erythema and lips, buccal mucosa, and tongue normal  NECK: supple, thyroid normal size, non-tender, without nodularity LYMPH:  no palpable  lymphadenopathy in the cervical, axillary or inguinal LUNGS: clear to auscultation and percussion with normal breathing effort HEART: regular rate & rhythm and no murmurs and no lower extremity edema ABDOMEN:abdomen soft, non-tender and normal bowel sounds Musculoskeletal:no cyanosis of digits and no clubbing  PSYCH: alert & oriented x 3 with fluent speech NEURO: no focal motor/sensory deficits BREAST: No palpable nodules in breast. No palpable axillary or supraclavicular lymphadenopathy (exam performed in the presence of a chaperone)   LABORATORY DATA:  I have reviewed the data as listed Lab Results  Component Value Date   WBC 8.7 10/23/2015   HGB 11.0* 10/23/2015   HCT 34.4* 10/23/2015   MCV 77.4* 10/23/2015   PLT 273 10/23/2015   Lab Results  Component Value Date   NA 142 10/23/2015   K 3.2* 10/23/2015   CO2 30* 10/23/2015    RADIOGRAPHIC STUDIES: I have personally reviewed the radiological reports and agreed with  the findings in the report.  ASSESSMENT AND PLAN:  Breast cancer of upper-inner quadrant of right female breast ([place]) Screening detected Right breast mass at 1:00:1.1 x 0.8 x 0.7 cm,grade 2 IDC plus DCIS, ER 100 present, PR 0%, HER-2 negative ratio 1.64, Ki-67 60%, T1cN0 stage IA  Pathology and radiology counseling:Discussed with the patient, the details of pathology including the type of breast cancer,the clinical staging, the significance of ER, PR and HER-2/neu receptors and the implications for treatment. After reviewing the pathology in detail, we proceeded to discuss the different treatment options between surgery, radiation, chemotherapy, antiestrogen therapies.  Recommendations: 1. Breast conserving surgery followed by 2. Oncotype DX testing to determine if chemotherapy would be of any benefit followed by 3. Adjuvant radiation therapy followed by 4. Adjuvant antiestrogen therapy  Oncotype counseling: I discussed Oncotype DX test. I explained to the  patient that this is a 21 gene panel to evaluate patient tumors DNA to calculate recurrence score. This would help determine whether patient has high risk or intermediate risk or low risk breast cancer. She understands that if her tumor was found to be high risk, she would benefit from systemic chemotherapy. If low risk, no need of chemotherapy. If she was found to be intermediate risk, we would need to evaluate the score as well as other risk factors and determine if an abbreviated chemotherapy may be of benefit.  Return to clinic after surgery to discuss final pathology report and then determine if Oncotype DX testing will need to be sent.   All questions were answered. The patient knows to call the clinic with any problems, questions or concerns.    Rulon Eisenmenger, MD 10/23/2015

## 2015-10-23 NOTE — Progress Notes (Signed)
[place] Psychosocial Distress Screening Spiritual Care  Met with Ms Karl in [facility] to introduce [facility] team/resources, reviewing distress screen per protocol.  The patient scored a 0 on the Psychosocial Distress Thermometer which indicates mild distress. Also assessed for distress and other psychosocial needs.   ONCBCN DISTRESS SCREENING 10/23/2015  Screening Type Initial Screening  Distress experienced in past week (1-10) 0  Referral to support programs Yes  Other Spiritual Care   Ms Cousar describes herself as "very fair-minded" ("I've always been able to see all sides of an issue."), "self-sufficient," and matter-of-fact ("I can't control things, so I just take them as they come."). She reports no distress, citing gratitude, faith, and perspective as key coping tools.  Per pt, many members of her husband's family have been through cancer, which shapes her understanding about how mild her situation is.  She reports good support from church and family.  Follow up needed: No.  Pt is aware of ongoing Support Team availability as needed/desired, but please also page if needs arise.  Thank you.  Chaplain Lorrin Jackson. [place], [facility] Pager [phone] Voicemail [phone]

## 2015-10-23 NOTE — Patient Instructions (Signed)

## 2015-10-23 NOTE — Progress Notes (Signed)
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   The encounter diagnosis was Breast cancer of upper-inner quadrant of right female breast ([place]).  HISTORY OF PRESENT ILLNESS::Shelley Lawrence is a 75 y.o. female who presented for routine screening mammogram / tomosynthesis 09/25/2015 where a right breast mass was detected. Ultrasound on 10/09/2015 showed a hypoechoic irregular mass with partly ill-defined margins and internal vascularity measuring 1.1 x 0.8 x 0.7 cm in size at the 1 o'clock position of the right breast, 4 cm from the nipple. Biopsy on 10/14/2015 revealed invasive ductal carcinoma of the right breast, Grade 2-3, ER +, PR (-), HER-2 (-), and Ki-67 60%.  The patient presents to breast clinic today to discuss the role of radiation in the management of her disease.  PREVIOUS RADIATION THERAPY: No  PAST MEDICAL HISTORY:  has a past medical history of Hypertension; Diabetes mellitus without complication ([place]); Arthritis; GERD (gastroesophageal reflux disease); Allergy; Anemia; Hot flashes; Broken foot; and Carpal tunnel syndrome.    PAST SURGICAL HISTORY: Past Surgical History  Procedure Laterality Date  . Tonsilectomy/adenoidectomy with myringotomy    . Abdominal hysterectomy    . Tubal ligation    . Back surgery    . Carpal tunnel release Right   . Ovary surgery      removal of ovarian tumor  . Knee surgery      FAMILY HISTORY: family history is not on file.  SOCIAL HISTORY:  reports that she has never smoked. She has never used smokeless tobacco. She reports that she does not drink alcohol or use illicit drugs.  ALLERGIES: Horse-derived products  MEDICATIONS:  Current Outpatient  Prescriptions  Medication Sig Dispense Refill  . aspirin 81 MG tablet Take 81 mg by mouth daily.    Marland Kitchen atorvastatin (LIPITOR) 40 MG tablet Take 40 mg by mouth daily.    Marland Kitchen b complex vitamins capsule Take 1 capsule by mouth daily.    . brimonidine-timolol (COMBIGAN) 0.2-0.5 % ophthalmic solution Place 1 drop into the left eye every 12 (twelve) hours.    . chlorpheniramine (CHLOR-TRIMETON) 4 MG tablet Take by mouth as needed for allergies.    . cycloSPORINE (RESTASIS) 0.05 % ophthalmic emulsion Place 1 drop into both eyes 2 (two) times daily.    Marland Kitchen esomeprazole (NEXIUM) 40 MG capsule Take 1 capsule (40 mg total) by mouth daily before breakfast. 30 capsule 1  . estropipate (OGEN) 1.5 MG tablet     . fexofenadine (ALLEGRA) 180 MG tablet Take 180 mg by mouth daily.    . fluticasone (VERAMYST) 27.5 MCG/SPRAY nasal spray Place 2 sprays into the nose daily.    Marland Kitchen ibuprofen (ADVIL,MOTRIN) 400 MG tablet Take 400 mg by mouth every 6 (six) hours as needed.    . latanoprost (XALATAN) 0.005 % ophthalmic solution Place 1 drop into both eyes at bedtime.    Marland Kitchen lisinopril-hydrochlorothiazide (PRINZIDE,ZESTORETIC) 20-12.5 MG per tablet Take 1 tablet by mouth daily.     . metFORMIN (GLUCOPHAGE-XR) 500 MG 24 hr tablet Take 500 mg by mouth daily with breakfast.     . Multiple Vitamins-Minerals (MULTIVITAMIN WITH MINERALS) tablet Take 1 tablet by mouth daily.    Glory Rosebush VERIO test strip     .  Specialty Vitamins Products (MAGNESIUM, AMINO ACID CHELATE,) 133 MG tablet Take 1 tablet by mouth. '250mg'$     . traMADol (ULTRAM) 50 MG tablet Take 50 mg by mouth every 8 (eight) hours as needed.      No current facility-administered medications for this encounter.   Gynecologic History  Age at first menstrual period? 13  Are you still having periods? No Approximate date of last period? 38  If you are still having periods: Are your periods regular? N/A  If you no longer have periods: Have you used hormone replacement? Yes  If  YES, for how long? Years When did you stop? N/A Obstetric History:  How many children have you carried to term? N/A Your age at first live birth? N/A  Pregnant now or trying to get pregnant? N/A  Have you used birth control pills or hormone shots for contraception? N/A  If so, for how long (or approximate dates)? N/A  Would you be interested in learning more about the options to preserve fertility? N/A Health Maintenance:  Have you ever had a colonoscopy? Yes If yes, date? 2012  Have you ever had a bone density? Yes If yes, date? 2013  Date of your last PAP smear? 1980 Date of your FIRST mammogram? 1975   REVIEW OF SYSTEMS:  A 15 point review of systems is documented in the electronic medical record. This was obtained by the nursing staff. However, I reviewed this with the patient to discuss relevant findings and make appropriate changes.    75 year-old woman. Unaccompanied. Reports bilateral knee pain. Patient is currently taking estrogen replacement.   PHYSICAL EXAM:  Vitals with BMI 10/23/2015  Height '5\' 2"'$   Weight 225 lbs 6 oz  BMI 97.3  Systolic 532  Diastolic 84  Pulse 87  Respirations 18      No palpable subclavicular or axillary adenopathy. The lungs are clear to auscultation. The heart has a regular rhythm and rate. The abdomen is soft and nontender with normal bowel sounds. Left breast reveals it to be large and pendulous without mass or nipple discharge. Right breast shows extensive bruising in the upper inner quadrant. No dominant mass or nipple discharge. No obvious mass but difficult to evaluate in light of the extensive bruising.  ECOG = 0   LABORATORY DATA:  Lab Results  Component Value Date   WBC 8.7 10/23/2015   HGB 11.0* 10/23/2015   HCT 34.4* 10/23/2015   MCV 77.4* 10/23/2015   PLT 273 10/23/2015   NEUTROABS 5.8 10/23/2015   Lab Results  Component Value Date   NA 142 10/23/2015   K 3.2* 10/23/2015   CO2 30* 10/23/2015   GLUCOSE 144* 10/23/2015    CREATININE 0.6 10/23/2015   CALCIUM 9.6 10/23/2015      RADIOGRAPHY: Mm Digital Diagnostic Unilat R  10/14/2015  CLINICAL DATA:  Status post ultrasound-guided core biopsy of a right breast mass. EXAM: DIAGNOSTIC RIGHT MAMMOGRAM POST ULTRASOUND BIOPSY COMPARISON:  Previous exam(s). FINDINGS: Mammographic images were obtained following ultrasound guided biopsy of a mass in the 1 o'clock region the right breast. Mammographic images showed there is a ribbon shaped clip in the upper-inner quadrant of the right breast. The patient has a post biopsy hematoma. Pressure was applied following the procedure, bleeding stopped and patient left our facility in good health. IMPRESSION: Status post ultrasound-guided core biopsy of the right breast with pathology pending. Final Assessment: Post Procedure Mammograms for Marker Placement Electronically Signed   By: Georgiana Shore.D.  On: 10/14/2015 15:28   US Breast Ltd Uni Right Inc Axilla  10/09/2015  CLINICAL DATA:  Screening recall for a possible mass in the right breast. EXAM: 2D DIGITAL DIAGNOSTIC RIGHT MAMMOGRAM WITH CAD AND ADJUNCT TOMO ULTRASOUND RIGHT BREAST COMPARISON:  Previous exam(s). ACR Breast Density Category b: There are scattered areas of fibroglandular density. FINDINGS: The possible mass persists on the spot compression imaging. 2 has irregular spiculated margins. It projects in the upper inner quadrant of the right breast. Mammographic images were processed with CAD. On physical exam, there is questionable small mass is palpated in the 1 o'clock position of the right breast. Targeted ultrasound is performed, showing a hypoechoic irregular mass with partly ill-defined margins and internal vascularity in the hit right breast at 1 o'clock, 4 cm from the nipple. It measures 11 x 7 x 8 mm in size. Evaluation of the right axilla shows no enlarged or abnormal lymph nodes. IMPRESSION: Suspicious 11 mm mass in the upper inner right breast. Biopsy is recommended.  RECOMMENDATION: Patient has been scheduled for ultrasound-guided core needle biopsy on 10/14/2015 high at 2 p.m. I have discussed the findings and recommendations with the patient. Results were also provided in writing at the conclusion of the visit. If applicable, a reminder letter will be sent to the patient regarding the next appointment. BI-RADS CATEGORY  5: Highly suggestive of malignancy. Electronically Signed   By: Lajean Manes M.D.   On: 10/09/2015 14:19   Mm Diag Breast Tomo Uni Right  10/09/2015  CLINICAL DATA:  Screening recall for a possible mass in the right breast. EXAM: 2D DIGITAL DIAGNOSTIC RIGHT MAMMOGRAM WITH CAD AND ADJUNCT TOMO ULTRASOUND RIGHT BREAST COMPARISON:  Previous exam(s). ACR Breast Density Category b: There are scattered areas of fibroglandular density. FINDINGS: The possible mass persists on the spot compression imaging. 2 has irregular spiculated margins. It projects in the upper inner quadrant of the right breast. Mammographic images were processed with CAD. On physical exam, there is questionable small mass is palpated in the 1 o'clock position of the right breast. Targeted ultrasound is performed, showing a hypoechoic irregular mass with partly ill-defined margins and internal vascularity in the hit right breast at 1 o'clock, 4 cm from the nipple. It measures 11 x 7 x 8 mm in size. Evaluation of the right axilla shows no enlarged or abnormal lymph nodes. IMPRESSION: Suspicious 11 mm mass in the upper inner right breast. Biopsy is recommended. RECOMMENDATION: Patient has been scheduled for ultrasound-guided core needle biopsy on 10/14/2015 high at 2 p.m. I have discussed the findings and recommendations with the patient. Results were also provided in writing at the conclusion of the visit. If applicable, a reminder letter will be sent to the patient regarding the next appointment. BI-RADS CATEGORY  5: Highly suggestive of malignancy. Electronically Signed   By: Lajean Manes  M.D.   On: 10/09/2015 14:19   Mm Screening Breast Tomo Bilateral  09/25/2015  CLINICAL DATA:  Screening. EXAM: 2D DIGITAL SCREENING BILATERAL MAMMOGRAM WITH CAD AND ADJUNCT TOMO COMPARISON:  Previous exam(s). ACR Breast Density Category b: There are scattered areas of fibroglandular density. FINDINGS: In the right breast, a possible mass warrants further evaluation. In the left breast, no findings suspicious for malignancy. Images were processed with CAD. IMPRESSION: Further evaluation is suggested for possible mass in the right breast. RECOMMENDATION: Diagnostic mammogram and possibly ultrasound of the right breast. (Code:[id number]) The patient will be contacted regarding the findings, and additional imaging will be scheduled. BI-RADS  CATEGORY  0: Incomplete. Need additional imaging evaluation and/or prior mammograms for comparison. Electronically Signed   By: Baird Lyons M.D.   On: 09/25/2015 16:20   [place] Rt Breast Bx W Loc Dev 1st Lesion Img Bx Spec US Guide  10/16/2015  ADDENDUM REPORT: 10/15/2015 13:57 ADDENDUM: Pathology revealed GRADE II-III INVASIVE DUCTAL CARCINOMA, DUCTAL CARCINOMA IN SITU of the Right breast at the 1:00 o'clock location. This was found to be concordant by Dr. Baird Lyons. Pathology results were discussed with the patient by telephone. The patient reported doing well after the biopsy with bleeding at the site. Post biopsy instructions and care were reviewed and questions were answered. The patient was encouraged to call [facility] for any additional concerns. The patient was referred to [facility] on Oct 23, 2015. Pathology results reported by Rene Kocher, RN on 10/15/2015. Electronically Signed   By: Baird Lyons M.D.   On: 10/15/2015 13:57  10/16/2015  CLINICAL DATA:  Suspicious right breast mass. EXAM: ULTRASOUND GUIDED RIGHT BREAST CORE NEEDLE BIOPSY COMPARISON:  Previous exam(s).  PROCEDURE: I met with the patient and we discussed the procedure of ultrasound-guided biopsy, including benefits and alternatives. We discussed the high likelihood of a successful procedure. We discussed the risks of the procedure including infection, bleeding, tissue injury, clip migration, and inadequate sampling. Informed written consent was given. The usual time-out protocol was performed immediately prior to the procedure. Using sterile technique and 1% Lidocaine as local anesthetic, under direct ultrasound visualization, a 12 gauge vacuum-assisted device was used to perform biopsy of a mass in the 1 o'clock region of the right breastusing a medial to lateral approach. At the conclusion of the procedure, a ribbon shaped tissue marker clip was deployed into the biopsy cavity. Follow-up 2-view mammogram was performed and dictated separately. IMPRESSION: Ultrasound-guided biopsy of the right breast. No apparent complications. Electronically Signed: By: Baird Lyons M.D. 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 Patient would appear to be a good candidate for breast conserving therapy.   I spoke to the patient today regarding her diagnosis and options for treatment. We discussed the equivalence in terms of survival and local failure between mastectomy and breast conservation. We discussed the role of radiation in decreasing local failures in patients who undergo lumpectomy. We discussed the process of simulation and the placement tattoos. We discussed 6 weeks of treatment as an outpatient. We discussed the possibility of asymptomatic lung damage. We discussed the low likelihood of secondary malignancies. We discussed the possible side effects including but not limited to skin redness, fatigue, permanent skin darkening, and breast swelling.   PLAN: Patient at this point would like to proceed with breast conserving therapy rather than mastectomy.  Patient will proceed with surgery which will include a lumpectomy and sentinel node procedure. Patient will be seen in the post operative setting for further discussion of radiation treatment.   Patient is somewhat hesitant in considering radiation therapy after seeing her husband undergo radiation to the tonsillar region and associated side effects. I discussed that her treatment and side effects would be much different from what he went through.   Discussed with the patient she should discontinue her estrogen replacement.      ------------------------------------------------  Billie Lade, PhD, MD     This document serves as a record of services personally performed by  Gery Pray, MD. It was created on his behalf by Arlyce Harman, a trained medical scribe. The creation of this record is based on the scribe's personal observations and the provider's statements to them. This document has been checked and approved by the attending provider.

## 2015-10-23 NOTE — Assessment & Plan Note (Signed)
Screening detected Right breast mass at 1:00:1.1 x 0.8 x 0.7 cm,grade 2 IDC plus DCIS, ER 100 present, PR 0%, HER-2 negative ratio 1.64, Ki-67 60%, T1cN0 stage IA  Pathology and radiology counseling:Discussed with the patient, the details of pathology including the type of breast cancer,the clinical staging, the significance of ER, PR and HER-2/neu receptors and the implications for treatment. After reviewing the pathology in detail, we proceeded to discuss the different treatment options between surgery, radiation, chemotherapy, antiestrogen therapies.  Recommendations: 1. Breast conserving surgery followed by 2. Oncotype DX testing to determine if chemotherapy would be of any benefit followed by 3. Adjuvant radiation therapy followed by 4. Adjuvant antiestrogen therapy  Oncotype counseling: I discussed Oncotype DX test. I explained to the patient that this is a 21 gene panel to evaluate patient tumors DNA to calculate recurrence score. This would help determine whether patient has high risk or intermediate risk or low risk breast cancer. She understands that if her tumor was found to be high risk, she would benefit from systemic chemotherapy. If low risk, no need of chemotherapy. If she was found to be intermediate risk, we would need to evaluate the score as well as other risk factors and determine if an abbreviated chemotherapy may be of benefit.  Return to clinic after surgery to discuss final pathology report and then determine if Oncotype DX testing will need to be sent.

## 2015-10-24 NOTE — Progress Notes (Signed)
Shelley Lawrence is a very pleasant 75 y.o. 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  Biopsy results revealed the tumor's prognostic profile is ER positive, PR negative, and HER2/neu negative. Ki67 is 60%.  She presents today to the [facility]) for treatment consideration and recommendations from the breast surgeon, radiation oncologist, and medical oncologist.     I briefly met with Shelley Lawrence during her [facility] visit today. We discussed the purpose of the Survivorship Clinic, which will include monitoring for recurrence, coordinating completion of age and gender-appropriate cancer screenings, promotion of overall wellness, as well as managing potential late/long-term side effects of anti-cancer treatments.    The treatment plan for Shelley Lawrence will likely include surgery, radiation therapy, and anti-estrogen therapy.  As of today, the intent of treatment for Shelley Lawrence is cure, therefore she will be eligible for the Survivorship Clinic upon her completion of treatment.  Her survivorship care plan (SCP) document will be drafted and updated throughout the course of her treatment trajectory. She will receive the SCP in an office visit with myself in the Survivorship Clinic once she has completed treatment.   Shelley Lawrence was encouraged to ask questions and all questions were answered to her satisfaction.  She was given my business card and encouraged to contact me with any concerns regarding survivorship.  I look forward to participating in her care.   Kenn File, [place] [phone]

## 2015-10-25 ENCOUNTER — Encounter (HOSPITAL_BASED_OUTPATIENT_CLINIC_OR_DEPARTMENT_OTHER): Payer: Self-pay | Admitting: *Deleted

## 2015-10-25 ENCOUNTER — Other Ambulatory Visit: Payer: Self-pay | Admitting: General Surgery

## 2015-10-25 DIAGNOSIS — C50211 Malignant neoplasm of upper-inner quadrant of right female breast: Secondary | ICD-10-CM

## 2015-10-28 NOTE — Progress Notes (Signed)
Cardiac notes reviewed with Dr Ola Spurr, [place] for [facility].

## 2015-10-29 ENCOUNTER — Telehealth: Payer: Self-pay | Admitting: *Deleted

## 2015-10-29 NOTE — Telephone Encounter (Signed)
  Oncology Nurse Navigator Documentation    Navigator Encounter Type: Telephone;MDC Follow-up (10/29/15 1200) Telephone: Outgoing Call;Clinic/MDC Follow-up (10/29/15 1200)     Surgery Date: 10/30/15 (10/29/15 1200) Treatment Initiated Date: 10/30/15 (10/29/15 1200)     Barriers/Navigation Needs: No barriers at this time;Coordination of Care;No Questions;No Needs (10/29/15 1200)   Interventions: Coordination of Care (10/29/15 1200)   Coordination of Care: Appts (POF placed for post op appt with Dr. Lindi Adie) (10/29/15 1200)                  Time Spent with Patient: 15 (10/29/15 1200)

## 2015-10-30 ENCOUNTER — Ambulatory Visit: Payer: PPO

## 2015-10-30 ENCOUNTER — Ambulatory Visit (HOSPITAL_COMMUNITY)
Admission: RE | Admit: 2015-10-30 | Discharge: 2015-10-30 | Disposition: A | Discharge status: Home / Self Care | Payer: PPO | Source: Ambulatory Visit | Attending: General Surgery | Admitting: General Surgery

## 2015-10-30 ENCOUNTER — Encounter (HOSPITAL_BASED_OUTPATIENT_CLINIC_OR_DEPARTMENT_OTHER)
Admission: RE | Disposition: A | Discharge status: Home / Self Care | Payer: Self-pay | Source: Ambulatory Visit | Attending: General Surgery

## 2015-10-30 ENCOUNTER — Ambulatory Visit (HOSPITAL_BASED_OUTPATIENT_CLINIC_OR_DEPARTMENT_OTHER)
Admission: RE | Admit: 2015-10-30 | Discharge: 2015-10-31 | Disposition: A | Discharge status: Home / Self Care | Payer: PPO | Source: Ambulatory Visit | Attending: General Surgery | Admitting: General Surgery

## 2015-10-30 ENCOUNTER — Ambulatory Visit (HOSPITAL_BASED_OUTPATIENT_CLINIC_OR_DEPARTMENT_OTHER): Payer: PPO | Admitting: Certified Registered"

## 2015-10-30 ENCOUNTER — Ambulatory Visit
Admission: RE | Admit: 2015-10-30 | Discharge: 2015-10-30 | Disposition: A | Discharge status: Home / Self Care | Payer: PPO | Source: Ambulatory Visit | Attending: General Surgery | Admitting: General Surgery

## 2015-10-30 ENCOUNTER — Encounter (HOSPITAL_BASED_OUTPATIENT_CLINIC_OR_DEPARTMENT_OTHER): Payer: Self-pay

## 2015-10-30 ENCOUNTER — Other Ambulatory Visit: Payer: Self-pay | Admitting: General Surgery

## 2015-10-30 ENCOUNTER — Ambulatory Visit (HOSPITAL_COMMUNITY): Payer: PPO

## 2015-10-30 DIAGNOSIS — C50211 Malignant neoplasm of upper-inner quadrant of right female breast: Secondary | ICD-10-CM | POA: Insufficient documentation

## 2015-10-30 DIAGNOSIS — Z6841 Body Mass Index (BMI) 40.0 and over, adult: Secondary | ICD-10-CM | POA: Insufficient documentation

## 2015-10-30 DIAGNOSIS — Z170 Estrogen receptor positive status [ER+]: Secondary | ICD-10-CM | POA: Diagnosis not present

## 2015-10-30 DIAGNOSIS — E119 Type 2 diabetes mellitus without complications: Secondary | ICD-10-CM | POA: Insufficient documentation

## 2015-10-30 DIAGNOSIS — Z7982 Long term (current) use of aspirin: Secondary | ICD-10-CM | POA: Diagnosis not present

## 2015-10-30 DIAGNOSIS — K219 Gastro-esophageal reflux disease without esophagitis: Secondary | ICD-10-CM | POA: Insufficient documentation

## 2015-10-30 DIAGNOSIS — R079 Chest pain, unspecified: Secondary | ICD-10-CM | POA: Diagnosis not present

## 2015-10-30 DIAGNOSIS — C50911 Malignant neoplasm of unspecified site of right female breast: Secondary | ICD-10-CM | POA: Diagnosis present

## 2015-10-30 DIAGNOSIS — Z7984 Long term (current) use of oral hypoglycemic drugs: Secondary | ICD-10-CM | POA: Diagnosis not present

## 2015-10-30 DIAGNOSIS — E7800 Pure hypercholesterolemia, unspecified: Secondary | ICD-10-CM | POA: Diagnosis not present

## 2015-10-30 DIAGNOSIS — G8918 Other acute postprocedural pain: Secondary | ICD-10-CM | POA: Diagnosis not present

## 2015-10-30 DIAGNOSIS — Z79899 Other long term (current) drug therapy: Secondary | ICD-10-CM | POA: Diagnosis not present

## 2015-10-30 DIAGNOSIS — I10 Essential (primary) hypertension: Secondary | ICD-10-CM | POA: Insufficient documentation

## 2015-10-30 HISTORY — PX: BREAST LUMPECTOMY: SHX2

## 2015-10-30 HISTORY — PX: BREAST LUMPECTOMY WITH NEEDLE LOCALIZATION AND AXILLARY SENTINEL LYMPH NODE BX: SHX5760

## 2015-10-30 LAB — GLUCOSE, CAPILLARY
Glucose-Capillary: 132 mg/dL — ABNORMAL HIGH (ref 65–99)
Glucose-Capillary: 117 mg/dL — ABNORMAL HIGH (ref 65–99)

## 2015-10-30 SURGERY — BREAST LUMPECTOMY WITH NEEDLE LOCALIZATION AND AXILLARY SENTINEL LYMPH NODE BX
Anesthesia: General | Site: Breast | Laterality: Right

## 2015-10-30 MED ORDER — DEXAMETHASONE SODIUM PHOSPHATE 4 MG/ML IJ SOLN
INTRAMUSCULAR | Status: DC | PRN
  Administered 2015-10-30: 10 mg via INTRAVENOUS

## 2015-10-30 MED ORDER — LIDOCAINE 2% (20 MG/ML) 5 ML SYRINGE
INTRAMUSCULAR | Status: DC | PRN
  Administered 2015-10-30: 100 mg via INTRAVENOUS

## 2015-10-30 MED ORDER — MEPERIDINE HCL 25 MG/ML IJ SOLN: 6.2500 mg | INTRAMUSCULAR | Status: DC | PRN

## 2015-10-30 MED ORDER — CHLORHEXIDINE GLUCONATE 4 % EX LIQD: 1.0000 "application " | Freq: Once | CUTANEOUS | Status: DC

## 2015-10-30 MED ORDER — MIDAZOLAM HCL 2 MG/2ML IJ SOLN
INTRAMUSCULAR | Status: AC
  Filled 2015-10-30: qty 2

## 2015-10-30 MED ORDER — ONDANSETRON HCL 4 MG/2ML IJ SOLN: 4.0000 mg | Freq: Four times a day (QID) | INTRAMUSCULAR | Status: DC | PRN

## 2015-10-30 MED ORDER — OXYCODONE HCL 5 MG PO TABS: 5.0000 mg | ORAL_TABLET | Freq: Once | ORAL | Status: DC | PRN

## 2015-10-30 MED ORDER — GLYCOPYRROLATE 0.2 MG/ML IJ SOLN: 0.2000 mg | Freq: Once | INTRAMUSCULAR | Status: DC | PRN

## 2015-10-30 MED ORDER — FENTANYL CITRATE (PF) 100 MCG/2ML IJ SOLN
INTRAMUSCULAR | Status: AC
  Filled 2015-10-30: qty 2

## 2015-10-30 MED ORDER — BUPIVACAINE HCL (PF) 0.25 % IJ SOLN
INTRAMUSCULAR | Status: DC | PRN
  Administered 2015-10-30: 20 mL

## 2015-10-30 MED ORDER — POTASSIUM CHLORIDE ER 10 MEQ PO TBCR: 10.0000 meq | EXTENDED_RELEASE_TABLET | Freq: Every day | ORAL | Status: DC

## 2015-10-30 MED ORDER — CEFAZOLIN SODIUM-DEXTROSE 2-4 GM/100ML-% IV SOLN
2.0000 g | INTRAVENOUS | Status: AC
  Administered 2015-10-30: 2 g via INTRAVENOUS

## 2015-10-30 MED ORDER — LACTATED RINGERS IV SOLN
INTRAVENOUS | Status: DC
  Administered 2015-10-30 (×2): via INTRAVENOUS

## 2015-10-30 MED ORDER — MORPHINE SULFATE (PF) 2 MG/ML IV SOLN: 1.0000 mg | INTRAVENOUS | Status: DC | PRN

## 2015-10-30 MED ORDER — HYDROMORPHONE HCL 1 MG/ML IJ SOLN
INTRAMUSCULAR | Status: AC
  Filled 2015-10-30: qty 1

## 2015-10-30 MED ORDER — OXYCODONE HCL 5 MG/5ML PO SOLN: 5.0000 mg | Freq: Once | ORAL | Status: DC | PRN

## 2015-10-30 MED ORDER — ATORVASTATIN CALCIUM 40 MG PO TABS: 40.0000 mg | ORAL_TABLET | Freq: Every day | ORAL | Status: DC

## 2015-10-30 MED ORDER — METFORMIN HCL ER 500 MG PO TB24
500.0000 mg | ORAL_TABLET | Freq: Every day | ORAL | Status: DC
  Administered 2015-10-30: 500 mg via ORAL

## 2015-10-30 MED ORDER — FENTANYL CITRATE (PF) 100 MCG/2ML IJ SOLN
50.0000 ug | INTRAMUSCULAR | Status: AC | PRN
  Administered 2015-10-30: 100 ug via INTRAVENOUS
  Administered 2015-10-30 (×2): 50 ug via INTRAVENOUS

## 2015-10-30 MED ORDER — HEPARIN SODIUM (PORCINE) 5000 UNIT/ML IJ SOLN
5000.0000 [IU] | Freq: Three times a day (TID) | INTRAMUSCULAR | Status: DC
  Administered 2015-10-31: 5000 [IU] via SUBCUTANEOUS

## 2015-10-30 MED ORDER — MIDAZOLAM HCL 2 MG/2ML IJ SOLN
1.0000 mg | INTRAMUSCULAR | Status: DC | PRN
Start: 2015-10-30 — End: 2015-10-30
  Administered 2015-10-30 (×2): 1 mg via INTRAVENOUS

## 2015-10-30 MED ORDER — ASPIRIN 81 MG PO TABS: 81.0000 mg | ORAL_TABLET | Freq: Every day | ORAL | Status: DC

## 2015-10-30 MED ORDER — BUPIVACAINE-EPINEPHRINE (PF) 0.5% -1:200000 IJ SOLN
INTRAMUSCULAR | Status: DC | PRN
  Administered 2015-10-30: 30 mL via PERINEURAL

## 2015-10-30 MED ORDER — ONDANSETRON HCL 4 MG/2ML IJ SOLN
INTRAMUSCULAR | Status: DC | PRN
  Administered 2015-10-30: 4 mg via INTRAVENOUS

## 2015-10-30 MED ORDER — TECHNETIUM TC 99M SULFUR COLLOID FILTERED
1.0000 | Freq: Once | INTRAVENOUS | Status: AC | PRN
  Administered 2015-10-30: 1 via INTRADERMAL

## 2015-10-30 MED ORDER — PHENYLEPHRINE 40 MCG/ML (10ML) SYRINGE FOR IV PUSH (FOR BLOOD PRESSURE SUPPORT)
PREFILLED_SYRINGE | INTRAVENOUS | Status: AC
  Filled 2015-10-30: qty 10

## 2015-10-30 MED ORDER — KCL IN DEXTROSE-NACL 20-5-0.9 MEQ/L-%-% IV SOLN: INTRAVENOUS | Status: DC

## 2015-10-30 MED ORDER — ONDANSETRON 4 MG PO TBDP: 4.0000 mg | ORAL_TABLET | Freq: Four times a day (QID) | ORAL | Status: DC | PRN

## 2015-10-30 MED ORDER — PHENYLEPHRINE 40 MCG/ML (10ML) SYRINGE FOR IV PUSH (FOR BLOOD PRESSURE SUPPORT)
PREFILLED_SYRINGE | INTRAVENOUS | Status: DC | PRN
  Administered 2015-10-30 (×3): 80 ug via INTRAVENOUS

## 2015-10-30 MED ORDER — CEFAZOLIN SODIUM-DEXTROSE 2-4 GM/100ML-% IV SOLN
INTRAVENOUS | Status: AC
  Filled 2015-10-30: qty 100

## 2015-10-30 MED ORDER — SCOPOLAMINE 1 MG/3DAYS TD PT72: 1.0000 | MEDICATED_PATCH | Freq: Once | TRANSDERMAL | Status: DC | PRN

## 2015-10-30 MED ORDER — OXYCODONE-ACETAMINOPHEN 5-325 MG PO TABS: 1.0000 | ORAL_TABLET | ORAL | Status: DC | PRN

## 2015-10-30 MED ORDER — HYDROMORPHONE HCL 1 MG/ML IJ SOLN
0.2500 mg | INTRAMUSCULAR | Status: DC | PRN
  Administered 2015-10-30 (×4): 0.5 mg via INTRAVENOUS

## 2015-10-30 MED ORDER — PROPOFOL 10 MG/ML IV BOLUS
INTRAVENOUS | Status: DC | PRN
  Administered 2015-10-30: 300 mg via INTRAVENOUS

## 2015-10-30 MED ORDER — OXYCODONE-ACETAMINOPHEN 5-325 MG PO TABS
1.0000 | ORAL_TABLET | ORAL | Status: DC | PRN
  Administered 2015-10-30 – 2015-10-31 (×5): 1 via ORAL
  Filled 2015-10-30 (×4): qty 1
  Filled 2015-10-30: qty 2

## 2015-10-30 MED ORDER — PANTOPRAZOLE SODIUM 40 MG PO TBEC: 40.0000 mg | DELAYED_RELEASE_TABLET | Freq: Every day | ORAL | Status: DC

## 2015-10-30 MED ORDER — LISINOPRIL-HYDROCHLOROTHIAZIDE 20-12.5 MG PO TABS: 1.0000 | ORAL_TABLET | Freq: Every day | ORAL | Status: DC

## 2015-10-30 MED ORDER — POTASSIUM CHLORIDE IN NACL 20-0.9 MEQ/L-% IV SOLN
INTRAVENOUS | Status: DC
  Administered 2015-10-30: 14:00:00 via INTRAVENOUS
  Filled 2015-10-30: qty 1000

## 2015-10-30 SURGICAL SUPPLY — 48 items
APPLIER CLIP 11 MED OPEN (CLIP) ×3
APR CLP MED 11 20 MLT OPN (CLIP) ×1
BLADE SURG 15 STRL LF DISP TIS (BLADE) ×1 IMPLANT
BLADE SURG 15 STRL SS (BLADE) ×3
CANISTER SUCT 1200ML W/VALVE (MISCELLANEOUS) ×3 IMPLANT
CHLORAPREP W/TINT 26ML (MISCELLANEOUS) ×3 IMPLANT
CLIP APPLIE 11 MED OPEN (CLIP) ×1 IMPLANT
COVER BACK TABLE 60X90IN (DRAPES) ×3 IMPLANT
COVER MAYO STAND STRL (DRAPES) ×3 IMPLANT
COVER PROBE W GEL 5X96 (DRAPES) ×3 IMPLANT
DECANTER SPIKE VIAL GLASS SM (MISCELLANEOUS) IMPLANT
DEVICE DUBIN W/COMP PLATE 8390 (MISCELLANEOUS) IMPLANT
DRAPE LAPAROSCOPIC ABDOMINAL (DRAPES) ×3 IMPLANT
DRAPE UTILITY XL STRL (DRAPES) ×3 IMPLANT
ELECT COATED BLADE 2.86 ST (ELECTRODE) ×3 IMPLANT
ELECT REM PT RETURN 9FT ADLT (ELECTROSURGICAL) ×3
ELECTRODE REM PT RTRN 9FT ADLT (ELECTROSURGICAL) ×1 IMPLANT
GLOVE BIO SURGEON STRL SZ7 (GLOVE) ×2 IMPLANT
GLOVE BIO SURGEON STRL SZ7.5 (GLOVE) ×3 IMPLANT
GLOVE BIOGEL PI IND STRL 7.0 (GLOVE) IMPLANT
GLOVE BIOGEL PI IND STRL 7.5 (GLOVE) IMPLANT
GLOVE BIOGEL PI INDICATOR 7.0 (GLOVE) ×2
GLOVE BIOGEL PI INDICATOR 7.5 (GLOVE) ×2
GOWN STRL REUS W/ TWL LRG LVL3 (GOWN DISPOSABLE) ×2 IMPLANT
GOWN STRL REUS W/TWL LRG LVL3 (GOWN DISPOSABLE) ×6
ILLUMINATOR WAVEGUIDE N/F (MISCELLANEOUS) IMPLANT
KIT MARKER MARGIN INK (KITS) IMPLANT
LIGHT WAVEGUIDE WIDE FLAT (MISCELLANEOUS) IMPLANT
LIQUID BAND (GAUZE/BANDAGES/DRESSINGS) ×6 IMPLANT
NDL HYPO 25X1 1.5 SAFETY (NEEDLE) ×1 IMPLANT
NDL SAFETY ECLIPSE 18X1.5 (NEEDLE) IMPLANT
NEEDLE HYPO 18GX1.5 SHARP (NEEDLE)
NEEDLE HYPO 25X1 1.5 SAFETY (NEEDLE) ×3 IMPLANT
NS IRRIG 1000ML POUR BTL (IV SOLUTION) ×3 IMPLANT
PACK BASIN DAY SURGERY FS (CUSTOM PROCEDURE TRAY) ×3 IMPLANT
PENCIL BUTTON HOLSTER BLD 10FT (ELECTRODE) ×3 IMPLANT
SLEEVE SCD COMPRESS KNEE MED (MISCELLANEOUS) ×3 IMPLANT
SPONGE LAP 18X18 X RAY DECT (DISPOSABLE) ×3 IMPLANT
STAPLER VISISTAT 35W (STAPLE) IMPLANT
SUT MON AB 4-0 PC3 18 (SUTURE) ×6 IMPLANT
SUT SILK 3 0 PS 1 (SUTURE) IMPLANT
SUT VICRYL 3-0 CR8 SH (SUTURE) ×3 IMPLANT
SYR CONTROL 10ML LL (SYRINGE) ×6 IMPLANT
TOWEL OR 17X24 6PK STRL BLUE (TOWEL DISPOSABLE) ×3 IMPLANT
TOWEL OR NON WOVEN STRL DISP B (DISPOSABLE) ×3 IMPLANT
TUBE CONNECTING 20'X1/4 (TUBING) ×1
TUBE CONNECTING 20X1/4 (TUBING) ×2 IMPLANT
YANKAUER SUCT BULB TIP NO VENT (SUCTIONS) ×3 IMPLANT

## 2015-10-30 NOTE — Anesthesia Preprocedure Evaluation (Signed)
Anesthesia Evaluation  Patient identified by MRN, date of birth, ID band Patient awake    Reviewed: Allergy & Precautions, NPO status , Patient's Chart, lab work & pertinent test results  Airway Mallampati: I  TM Distance: >3 FB Neck ROM: Full    Dental  (+) Teeth Intact, Dental Advisory Given   Pulmonary    breath sounds clear to auscultation       Cardiovascular hypertension, Pt. on medications  Rhythm:Regular Rate:Normal     Neuro/Psych    GI/Hepatic GERD  Medicated and Controlled,  Endo/Other  diabetes, Type 2, Oral Hypoglycemic AgentsMorbid obesity  Renal/GU      Musculoskeletal   Abdominal   Peds  Hematology   Anesthesia Other Findings   Reproductive/Obstetrics                             Anesthesia Physical Anesthesia Plan  ASA: III  Anesthesia Plan: General   Post-op Pain Management: GA combined w/ Regional for post-op pain   Induction: Intravenous  Airway Management Planned: LMA  Additional Equipment:   Intra-op Plan:   Post-operative Plan: Extubation in OR  Informed Consent: I have reviewed the patients History and Physical, chart, labs and discussed the procedure including the risks, benefits and alternatives for the proposed anesthesia with the patient or authorized representative who has indicated his/her understanding and acceptance.   Dental advisory given  Plan Discussed with: CRNA, Anesthesiologist and Surgeon  Anesthesia Plan Comments:         Anesthesia Quick Evaluation

## 2015-10-30 NOTE — Transfer of Care (Signed)
Immediate Anesthesia Transfer of Care Note  Patient: Shelley Lawrence  Procedure(s) Performed: Procedure(s): BREAST LUMPECTOMY WITH NEEDLE LOCALIZATION AND AXILLARY SENTINEL LYMPH NODE BX (Right)  Patient Location: PACU  Anesthesia Type:GA combined with regional for post-op pain  Level of Consciousness: awake, alert  and oriented  Airway & Oxygen Therapy: Patient Spontanous Breathing and Patient connected to face mask oxygen  Post-op Assessment: Report given to RN and Post -op Vital signs reviewed and stable  Post vital signs: Reviewed and stable  Last Vitals:  Filed Vitals:   10/30/15 1050 10/30/15 1255  BP: 135/86   Pulse: 86   Temp:    Resp: 17 13    Last Pain: There were no vitals filed for this visit.       Complications: No apparent anesthesia complications

## 2015-10-30 NOTE — Interval H&P Note (Signed)
History and Physical Interval Note:  10/30/2015 11:05 AM  Shelley Lawrence  has presented today for surgery, with the diagnosis of RIGHT BREAST CANCER  The various methods of treatment have been discussed with the patient and family. After consideration of risks, benefits and other options for treatment, the patient has consented to  Procedure(s): BREAST LUMPECTOMY WITH NEEDLE LOCALIZATION AND AXILLARY SENTINEL LYMPH NODE BX (Right) as a surgical intervention .  The patient's history has been reviewed, patient examined, no change in status, stable for surgery.  I have reviewed the patient's chart and labs.  Questions were answered to the patient's satisfaction.     [name],Wendell Fiebig S

## 2015-10-30 NOTE — Progress Notes (Signed)
Assisted Dr. Al Corpus with right, ultrasound guided, pectoralis block and nuc med tech with nuc med inj . Side rails up, monitors on throughout procedure. See vital signs in flow sheet. Tolerated Procedure well.

## 2015-10-30 NOTE — Op Note (Signed)
10/30/2015  12:45 PM  PATIENT:  Shelley Lawrence  75 y.o. female  PRE-OPERATIVE DIAGNOSIS:  RIGHT BREAST CANCER  POST-OPERATIVE DIAGNOSIS:  RIGHT BREAST CANCER  PROCEDURE:  Procedure(s): BREAST LUMPECTOMY WITH NEEDLE LOCALIZATION AND AXILLARY SENTINEL LYMPH NODE BX (Right)  SURGEON:  Surgeon(s) and Role:    * Jovita Kussmaul, MD - Primary  PHYSICIAN ASSISTANT:   ASSISTANTS: none   ANESTHESIA:   general  EBL:  Total I/O In: 1000 [I.V.:1000] Out: -   BLOOD ADMINISTERED:none  DRAINS: none   LOCAL MEDICATIONS USED:  MARCAINE     SPECIMEN:  Source of Specimen:  right breast tissue and sentinel node  DISPOSITION OF SPECIMEN:  PATHOLOGY  COUNTS:  YES  TOURNIQUET:  * No tourniquets in log *  DICTATION: .Dragon Dictation   After informed consent was obtained the patient was brought to the operating room and placed in the supine position on the operating table. After adequate induction of general anesthesia the patient's right chest, breast, and axillary area were prepped with ChloraPrep, allowed to dry, and draped in usual sterile manner. An appropriate timeout was performed. Earlier in the day patient underwent injection of 1 mCi of technetium sulfur colloid in the subareolar position on the right. Also earlier in the day the patient underwent wire localization procedure and the wire was entering the right breast in the upper aspect.  Attention was first turned to the right axilla. The neoprobe was set to technetium. A hot spot was identified in the right axilla. A transversely oriented incision was made with a 15 blade knife overlying the hot spot. The incision was carried through the skin and subcutaneous tissue sharply with the electrocautery until the axilla was entered. A [place] retractor was deployed. The neoprobe to direct blunt dissection I was able to identify a hot lymph node. The lymph node was excised sharply with electrocautery and the lymphatics were controlled with  clips. Ex vivo counts on this node were approximately 800. No other hot or palpable lymph nodes were identified in the right axilla. The area was infiltrated with quarter percent Marcaine.  The area was irrigated with saline. The deep layer of the wound was then closed with interrupted 3-0 Vicryl stitches. The skin was then closed with a running 4-0 Monocryl subcuticular stitch. Attention was then turned to the right breast.  An elliptical incision was made around the wire in the 12:00 position of the right breast. The incision was carried through the skin and subcutaneous tissue sharply with the electrocautery. There was a large hematoma associated with this area.  A Circular portion of breast tissue was excised sharply around the wire. Once the specimen was removed it was oriented with the appropriate paint colors.  A specimen radiograph was obtained that showed the clip and  Wire to be in the center of the specimen. The specimen was then sent to pathology for further evaluation. Hemostasis was achieved using the Bovie electrocautery. The wound was irrigated with saline and infiltrated with quarter percent Marcaine. The cavity was marked with clips. The deep layer of the wound was closed with layers of interrupted 3-0 Vicryl stitches. The skin was then closed with interrupted 4-0 Monocryl subcuticular stitches. Dermabond dressings were applied. The patient tolerated the procedure well. At the end of the case all needle sponge and instrument counts were correct. The patient was then awakened and taken to recovery in stable condition.  PLAN OF CARE: Discharge to home after PACU  PATIENT DISPOSITION:  PACU -  hemodynamically stable.   Delay start of Pharmacological VTE agent (>24hrs) due to surgical blood loss or risk of bleeding: not applicable

## 2015-10-30 NOTE — H&P (Signed)
Shelley [facility]  Location: [facility] Patient #: [id number] DOB: 1940/08/29 Undefined / Language: Shelley [facility] / Race: Black or African American Female   History of Present Illness  The patient is a 75 year old female who presents with breast cancer. We are asked to see the patient in consultation by Dr. Sondra Lawrence to evaluate her for a right breast cancer. The patient is a 75 year old black female who recently went for a routine screening mammogram. At that time she was found to have small mass in the upper inner right breast. this measured 1.1cm by u/s. It was biopsied and came back as a grade 2 breast cancer. It was ER+, PR-, and Her2 - with a Ki67 of 60%. Her axilla looked negative. She denied breast pain or discharge from the nipple. she did develop a large hematoma at the biopsy site.   Other Problems Arthritis Back Pain Diabetes Mellitus Gastric Ulcer Gastroesophageal Reflux Disease Heart murmur High blood pressure Hypercholesterolemia Umbilical Hernia Repair  Past Surgical History  Breast Biopsy Right. Hysterectomy (not due to cancer) - Complete Tonsillectomy  Diagnostic Studies History Colonoscopy 1-5 years ago Mammogram within last year Pap Smear >5 years ago  Medication History  Medications Reconciled  Social History  Caffeine use Carbonated beverages. No alcohol use No drug use Tobacco use Never smoker.  Family History  Alcohol Abuse Father. Arthritis Father. Cerebrovascular Accident Mother. Diabetes Mellitus Mother. Hypertension Mother. Respiratory Condition Father.  Pregnancy / Birth History  Age at menarche 67 years. Age of menopause <46 Contraceptive History Oral contraceptives. Gravida 0    Review of Systems  General Present- Night Sweats. Not Present- Appetite Loss, Chills, Fatigue, Fever, Weight Gain and Weight Loss. Skin Not Present- Change in Wart/Mole, Dryness, Hives, Jaundice, New Lesions,  Non-Healing Wounds, Rash and Ulcer. HEENT Present- Seasonal Allergies and Wears glasses/contact lenses. Not Present- Earache, Hearing Loss, Hoarseness, Nose Bleed, Oral Ulcers, Ringing in the Ears, Sinus Pain, Sore Throat, Visual Disturbances and Yellow Eyes. Respiratory Present- Snoring. Not Present- Bloody sputum, Chronic Cough, Difficulty Breathing and Wheezing. Breast Not Present- Breast Mass, Breast Pain, Nipple Discharge and Skin Changes. Cardiovascular Present- Swelling of Extremities. Not Present- Chest Pain, Difficulty Breathing Lying Down, Leg Cramps, Palpitations, Rapid Heart Rate and Shortness of Breath. Gastrointestinal Not Present- Abdominal Pain, Bloating, Bloody Stool, Change in Bowel Habits, Chronic diarrhea, Constipation, Difficulty Swallowing, Excessive gas, Gets full quickly at meals, Hemorrhoids, Indigestion, Nausea, Rectal Pain and Vomiting. Female Genitourinary Present- Frequency, Nocturia and Urgency. Not Present- Painful Urination and Pelvic Pain. Musculoskeletal Present- Joint Pain. Not Present- Back Pain, Joint Stiffness, Muscle Pain, Muscle Weakness and Swelling of Extremities. Neurological Present- Tingling and Trouble walking. Not Present- Decreased Memory, Fainting, Headaches, Numbness, Seizures, Tremor and Weakness. Psychiatric Not Present- Anxiety, Bipolar, Change in Sleep Pattern, Depression, Fearful and Frequent crying. Endocrine Present- Hot flashes. Not Present- Cold Intolerance, Excessive Hunger, Hair Changes, Heat Intolerance and New Diabetes. Hematology Present- Excessive bleeding. Not Present- Easy Bruising, Gland problems, HIV and Persistent Infections.   Physical Exam  General Mental Status-Alert. General Appearance-Consistent with stated age. Hydration-Well hydrated. Voice-Normal.  Head and Neck Head-normocephalic, atraumatic with no lesions or palpable masses. Trachea-midline. Thyroid Gland Characteristics - normal size and  consistency.  Eye Eyeball - Bilateral-Extraocular movements intact. Sclera/Conjunctiva - Bilateral-No scleral icterus.  Chest and Lung Exam Chest and lung exam reveals -quiet, even and easy respiratory effort with no use of accessory muscles and on auscultation, normal breath sounds, no adventitious sounds and normal vocal resonance. Inspection  Chest Wall - Normal. Back - normal.  Breast Note: There is a large palpable bruise in the upper inner right breast. There is no other palpable mass in either breast. There is no palpable axillary, supraclavicular, or cervical lymphadenopathy.   Cardiovascular Cardiovascular examination reveals -normal pedal pulses bilaterally. Note: there is an audible systolic murmur  Abdomen Inspection Inspection of the abdomen reveals - No Hernias. Skin - Scar - no surgical scars. Palpation/Percussion Palpation and Percussion of the abdomen reveal - Soft, Non Tender, No Rebound tenderness, No Rigidity (guarding) and No hepatosplenomegaly. Auscultation Auscultation of the abdomen reveals - Bowel sounds normal.  Neurologic Neurologic evaluation reveals -alert and oriented x 3 with no impairment of recent or remote memory. Mental Status-Normal.  Musculoskeletal Normal Exam - Left-Upper Extremity Strength Normal and Lower Extremity Strength Normal. Normal Exam - Right-Upper Extremity Strength Normal and Lower Extremity Strength Normal.  Lymphatic Head & Neck  General Head & Neck Lymphatics: Bilateral - Description - Normal. Axillary  General Axillary Region: Bilateral - Description - Normal. Tenderness - Non Tender. Femoral & Inguinal  Generalized Femoral & Inguinal Lymphatics: Bilateral - Description - Normal. Tenderness - Non Tender.    Assessment & Plan  BREAST CANCER OF UPPER-INNER QUADRANT OF RIGHT FEMALE BREAST (C50.211) Impression: The patient appears to have a small stage 1 cancer of the upper inner right breast. I have  discussed with her in detail the different options for treatment and she favors breast conservation. she is also a good candidate for sentinel node mapping. I have discussed with her in detail the risks and benefits of the surgery as well as some of the technical aspects and she understands and wishes to proceed. She would require a wire localization because of the large hematoma. her heart murmur has been evaluated by cardiology in the recent past    Signed by Luella Cook, MD

## 2015-10-30 NOTE — Anesthesia Postprocedure Evaluation (Signed)
Anesthesia Post Note  Patient: Shelley Lawrence  Procedure(s) Performed: Procedure(s) (LRB): BREAST LUMPECTOMY WITH NEEDLE LOCALIZATION AND AXILLARY SENTINEL LYMPH NODE BX (Right)  Patient location during evaluation: PACU Anesthesia Type: General Level of consciousness: awake and alert Pain management: pain level controlled Vital Signs Assessment: post-procedure vital signs reviewed and stable Respiratory status: spontaneous breathing, nonlabored ventilation and respiratory function stable Cardiovascular status: blood pressure returned to baseline and stable Postop Assessment: no signs of nausea or vomiting Anesthetic complications: no    Last Vitals:  Filed Vitals:   10/30/15 1430 10/30/15 1530  BP: 139/78   Pulse: 87 69  Temp: 37 C   Resp: 16     Last Pain:  Filed Vitals:   10/30/15 1533  PainSc: 4                  Rudransh Bellanca A

## 2015-10-30 NOTE — Anesthesia Procedure Notes (Addendum)
Anesthesia Regional Block:  Pectoralis block  Pre-Anesthetic Checklist: ,, timeout performed, Correct Patient, Correct Site, Correct Laterality, Correct Procedure, Correct Position, site marked, Risks and benefits discussed,  Surgical consent,  Pre-op evaluation,  At surgeon's request and post-op pain management  Laterality: Right and Upper  Prep: chloraprep       Needles:  Injection technique: Single-shot  Needle Type: Echogenic Needle     Needle Length: 9cm 9 cm Needle Gauge: 21 and 21 G    Additional Needles:  Procedures: ultrasound guided (picture in chart) Pectoralis block Narrative:  Start time: 10/30/2015 10:35 AM End time: 10/30/2015 10:40 AM Injection made incrementally with aspirations every 5 mL.  Performed by: Personally  Anesthesiologist: CREWS, DAVID   Procedure Name: LMA Insertion Date/Time: 10/30/2015 11:24 AM Performed by: Melynda Ripple D Pre-anesthesia Checklist: Patient identified, Emergency Drugs available, Suction available and Patient being monitored Patient Re-evaluated:Patient Re-evaluated prior to inductionOxygen Delivery Method: Circle System Utilized Preoxygenation: Pre-oxygenation with 100% oxygen Intubation Type: IV induction Ventilation: Mask ventilation without difficulty LMA: LMA inserted LMA Size: 4.0 Number of attempts: 1 Airway Equipment and Method: Bite block Placement Confirmation: positive ETCO2 Tube secured with: Tape Dental Injury: Teeth and Oropharynx as per pre-operative assessment       Right PEC block image

## 2015-10-31 ENCOUNTER — Encounter (HOSPITAL_BASED_OUTPATIENT_CLINIC_OR_DEPARTMENT_OTHER): Payer: Self-pay | Admitting: General Surgery

## 2015-10-31 DIAGNOSIS — C50211 Malignant neoplasm of upper-inner quadrant of right female breast: Secondary | ICD-10-CM | POA: Diagnosis not present

## 2015-10-31 MED ORDER — HEPARIN SODIUM (PORCINE) 5000 UNIT/ML IJ SOLN
INTRAMUSCULAR | Status: AC
  Filled 2015-10-31: qty 1

## 2015-10-31 NOTE — Progress Notes (Signed)
1 Day Post-Op  Subjective: No complaints. Ready to go home  Objective: Vital signs in last 24 hours: Temp:  [97.4 F (36.3 C)-98.6 F (37 C)] 97.8 F (36.6 C) (05/25 0530) Pulse Rate:  [69-99] 89 (05/25 0530) Resp:  [12-36] 20 (05/25 0530) BP: (110-189)/(70-122) 131/75 mmHg (05/25 0530) SpO2:  [92 %-100 %] 98 % (05/25 0745) Weight:  [99.451 kg (219 lb 4 oz)] 99.451 kg (219 lb 4 oz) (05/24 1010)    Intake/Output from previous day: 05/24 0701 - 05/25 0700 In: 2976 [P.O.:1026; I.V.:1950] Out: 2175 [Urine:2175] Intake/Output this shift: Total I/O In: 120 [P.O.:120] Out: 150 [Urine:150]  Resp: clear to auscultation bilaterally Breasts: incision looks good Cardio: regular rate and rhythm GI: soft, non-tender; bowel sounds normal; no masses,  no organomegaly  Lab Results:  No results for input(s): WBC, HGB, HCT, PLT in the last 72 hours. BMET No results for input(s): NA, K, CL, CO2, GLUCOSE, BUN, CREATININE, CALCIUM in the last 72 hours. PT/INR No results for input(s): LABPROT, INR in the last 72 hours. ABG No results for input(s): PHART, HCO3 in the last 72 hours.  Invalid input(s): PCO2, PO2  Studies/Results: Nm Sentinel Node Inj-no Rpt (breast)  10/30/2015  CLINICAL DATA: right breast cancer Sulfur colloid was injected intradermally by the nuclear medicine technologist for breast cancer sentinel node localization.   Mm Breast Surgical Specimen  10/30/2015  CLINICAL DATA:  Status post right lumpectomy EXAM: SPECIMEN RADIOGRAPH OF THE RIGHT BREAST COMPARISON:  Previous exam(s). FINDINGS: Status post excision of the right breast. The wire tip and biopsy marker clip are present and are marked for pathology. IMPRESSION: Specimen radiograph of the right breast. Electronically Signed   By: Pamelia Hoit M.D.   On: 10/30/2015 12:20   Mm Rt Plc Breast Loc Dev   1st Lesion  Inc Mammo Guide  10/30/2015  CLINICAL DATA:  75 year old female for wire localization of biopsy site  demonstrating invasive ductal carcinoma EXAM: NEEDLE LOCALIZATION OF THE RIGHT BREAST WITH MAMMO GUIDANCE COMPARISON:  Previous exams. FINDINGS: Patient presents for needle localization prior to right lumpectomy. I met with the patient and we discussed the procedure of needle localization including benefits and alternatives. We discussed the high likelihood of a successful procedure. We discussed the risks of the procedure, including infection, bleeding, tissue injury, and further surgery. Informed, written consent was given. The usual time-out protocol was performed immediately prior to the procedure. Using mammographic guidance, sterile technique, 1% lidocaine and a 7 cm modified Kopans needle, the ribbon shaped biopsy marker and mass within the upper, inner right breast were localized using a superior to inferior approach. The images were marked for Dr. Marlou Starks. IMPRESSION: Needle localization of the right breast. No apparent complications. Electronically Signed   By: Pamelia Hoit M.D.   On: 10/30/2015 09:09    Anti-infectives: Anti-infectives    Start     Dose/Rate Route Frequency Ordered Stop   10/30/15 0939  ceFAZolin (ANCEF) IVPB 2g/100 mL premix     2 g 200 mL/hr over 30 Minutes Intravenous On call to O.R. 10/30/15 0939 10/30/15 1116      Assessment/Plan: s/p Procedure(s): BREAST LUMPECTOMY WITH NEEDLE LOCALIZATION AND AXILLARY SENTINEL LYMPH NODE BX (Right) Discharge     [name],Kynnadi Dicenso S 10/31/2015

## 2015-11-06 ENCOUNTER — Encounter: Payer: Self-pay | Admitting: Physical Therapy

## 2015-11-07 ENCOUNTER — Ambulatory Visit (HOSPITAL_BASED_OUTPATIENT_CLINIC_OR_DEPARTMENT_OTHER): Payer: PPO | Admitting: Hematology and Oncology

## 2015-11-07 ENCOUNTER — Encounter: Payer: Self-pay | Admitting: Hematology and Oncology

## 2015-11-07 VITALS — BP 180/77 | HR 78 | Temp 98.9°F | Resp 19 | Ht 61.25 in | Wt 221.8 lb

## 2015-11-07 DIAGNOSIS — C50211 Malignant neoplasm of upper-inner quadrant of right female breast: Secondary | ICD-10-CM | POA: Diagnosis not present

## 2015-11-07 DIAGNOSIS — Z170 Estrogen receptor positive status [ER+]: Secondary | ICD-10-CM

## 2015-11-07 NOTE — Progress Notes (Signed)
Patient Care Team: Jani Gravel, MD as PCP - General (Internal Medicine) Autumn Messing III, MD as Consulting Physician (General Surgery) Nicholas Lose, MD as Consulting Physician (Oncology) Gery Pray, MD as Consulting Physician (Radiation Oncology) Sylvan Cheese, NP as Nurse Practitioner (Hematology and Oncology)  DIAGNOSIS: Breast cancer of upper-inner quadrant of right female breast [facility])   Staging form: Breast, AJCC 7th Edition     Clinical stage from 10/23/2015: Stage IA (T1c, N0, M0) - Unsigned   SUMMARY OF ONCOLOGIC HISTORY: He   Breast cancer of upper-inner quadrant of right female breast ([place])   10/14/2015 Initial Diagnosis Screening detected Right breast mass at 1:00:1.1 x 0.8 x 0.7 cm,grade 2 IDC plus DCIS, ER 100 present, PR 0%, HER-2 negative ratio 1.64, Ki-67 60%, T1cN0 stage IA   10/30/2015 Surgery Right lumpectomy: IDC grade 2, 1.4 cm, IG DCIS, margins negative, 0/2 lymph nodes negative, ER 100%, PR 0%, HER-2 negative, Ki-67 60 %, T1 cN0 stage IA    CHIEF COMPLIANT: Follow-up after recent lumpectomy  INTERVAL HISTORY: Shelley Lawrence is a 75 year old with above-mentioned history right breast cancer underwent right lumpectomy and is here to discuss the pathology report. She is healing very well from surgery. She denies any pain or discomfort. She takes occasional Motrin.  REVIEW OF SYSTEMS:   Constitutional: Denies fevers, chills or abnormal weight loss Eyes: Denies blurriness of vision Ears, nose, mouth, throat, and face: Denies mucositis or sore throat Respiratory: Denies cough, dyspnea or wheezes Cardiovascular: Denies palpitation, chest discomfort Gastrointestinal:  Denies nausea, heartburn or change in bowel habits Skin: Denies abnormal skin rashes Lymphatics: Denies new lymphadenopathy or easy bruising Neurological:Denies numbness, tingling or new weaknesses Behavioral/Psych: Mood is stable, no new changes  Extremities: No lower extremity edema Breast:   Recent lumpectomy All other systems were reviewed with the patient and are negative.  I have reviewed the past medical history, past surgical history, social history and family history with the patient and they are unchanged from previous note.  ALLERGIES:  is allergic to horse-derived products.  MEDICATIONS:  Current Outpatient Prescriptions  Medication Sig Dispense Refill  . aspirin 81 MG tablet Take 81 mg by mouth daily.    Marland Kitchen atorvastatin (LIPITOR) 40 MG tablet Take 40 mg by mouth daily.    Marland Kitchen b complex vitamins capsule Take 1 capsule by mouth daily.    . brimonidine-timolol (COMBIGAN) 0.2-0.5 % ophthalmic solution Place 1 drop into the left eye every 12 (twelve) hours.    . cycloSPORINE (RESTASIS) 0.05 % ophthalmic emulsion Place 1 drop into both eyes 2 (two) times daily.    Marland Kitchen esomeprazole (NEXIUM) 40 MG capsule Take 1 capsule (40 mg total) by mouth daily before breakfast. 30 capsule 1  . fexofenadine (ALLEGRA) 180 MG tablet Take 180 mg by mouth daily.    . fluticasone (VERAMYST) 27.5 MCG/SPRAY nasal spray Place 2 sprays into the nose daily.    Marland Kitchen ibuprofen (ADVIL,MOTRIN) 400 MG tablet Take 400 mg by mouth every 6 (six) hours as needed.    . latanoprost (XALATAN) 0.005 % ophthalmic solution Place 1 drop into both eyes at bedtime.    Marland Kitchen lisinopril-hydrochlorothiazide (PRINZIDE,ZESTORETIC) 20-12.5 MG per tablet Take 1 tablet by mouth daily.     . metFORMIN (GLUCOPHAGE-XR) 500 MG 24 hr tablet Take 500 mg by mouth daily with breakfast.     . Multiple Vitamins-Minerals (MULTIVITAMIN WITH MINERALS) tablet Take 1 tablet by mouth daily.    Glory Rosebush VERIO test strip     .  oxyCODONE-acetaminophen (ROXICET) 5-325 MG tablet Take 1-2 tablets by mouth every 4 (four) hours as needed. 30 tablet 0  . potassium chloride (K-DUR) 10 MEQ tablet Take 10 mEq by mouth daily.    Marland Kitchen Specialty Vitamins Products (MAGNESIUM, AMINO ACID CHELATE,) 133 MG tablet Take 1 tablet by mouth. '250mg'$     . traMADol (ULTRAM) 50  MG tablet Take 50 mg by mouth every 8 (eight) hours as needed.      No current facility-administered medications for this visit.    PHYSICAL EXAMINATION: ECOG PERFORMANCE STATUS: 1 - Symptomatic but completely ambulatory  Filed Vitals:   11/07/15 1446  BP: 180/77  Pulse: 78  Temp: 98.9 F (37.2 C)  Resp: 19   Filed Weights   11/07/15 1446  Weight: 221 lb 12.8 oz (100.608 kg)    GENERAL:alert, no distress and comfortable SKIN: skin color, texture, turgor are normal, no rashes or significant lesions EYES: normal, Conjunctiva are pink and non-injected, sclera clear OROPHARYNX:no exudate, no erythema and lips, buccal mucosa, and tongue normal  NECK: supple, thyroid normal size, non-tender, without nodularity LYMPH:  no palpable lymphadenopathy in the cervical, axillary or inguinal LUNGS: clear to auscultation and percussion with normal breathing effort HEART: regular rate & rhythm and no murmurs and no lower extremity edema ABDOMEN:abdomen soft, non-tender and normal bowel sounds MUSCULOSKELETAL:no cyanosis of digits and no clubbing  NEURO: alert & oriented x 3 with fluent speech, no focal motor/sensory deficits EXTREMITIES: No lower extremity edema  LABORATORY DATA:  I have reviewed the data as listed   Chemistry      Component Value Date/Time   NA 142 10/23/2015 1217   K 3.2* 10/23/2015 1217   CO2 30* 10/23/2015 1217   BUN 14.3 10/23/2015 1217   CREATININE 0.6 10/23/2015 1217      Component Value Date/Time   CALCIUM 9.6 10/23/2015 1217   ALKPHOS 94 10/23/2015 1217   Lawrence 17 10/23/2015 1217   ALT 15 10/23/2015 1217   BILITOT 0.32 10/23/2015 1217       Lab Results  Component Value Date   WBC 8.7 10/23/2015   HGB 11.0* 10/23/2015   HCT 34.4* 10/23/2015   MCV 77.4* 10/23/2015   PLT 273 10/23/2015   NEUTROABS 5.8 10/23/2015     ASSESSMENT & PLAN:  Breast cancer of upper-inner quadrant of right female breast ([facility]) Right lumpectomy 10/30/2015: IDC grade 2,  1.4 cm, IG DCIS, margins negative, 0/2 lymph nodes negative, ER 100%, PR 0%, HER-2 negative, Ki-67 60 %, T1 cN0 stage IA  Treatment plan: 1. Oncotype DX testing to determine if chemotherapy would be of any benefit followed by 2. Adjuvant radiation therapy followed by 3. Adjuvant antiestrogen therapy  Return to clinic based upon Oncotype DX test result   No orders of the defined types were placed in this encounter.   The patient has a good understanding of the overall plan. she agrees with it. she will call with any problems that may develop before the next visit here.   Rulon Eisenmenger, MD 11/07/2015

## 2015-11-07 NOTE — Assessment & Plan Note (Signed)
Right lumpectomy 10/30/2015: IDC grade 2, 1.4 cm, IG DCIS, margins negative, 0/2 lymph nodes negative, ER 100%, PR 0%, HER-2 negative, Ki-67 60 %, T1 cN0 stage IA  Treatment plan: 1. Oncotype DX testing to determine if chemotherapy would be of any benefit followed by 2. Adjuvant radiation therapy followed by 3. Adjuvant antiestrogen therapy  Return to clinic based upon Oncotype DX test result

## 2015-11-08 ENCOUNTER — Telehealth: Payer: Self-pay | Admitting: *Deleted

## 2015-11-08 NOTE — Telephone Encounter (Signed)
Received order per Dr. Lindi Adie for oncotype testing. Requisition sent to pathology.

## 2015-11-11 ENCOUNTER — Ambulatory Visit: Payer: PPO | Admitting: Hematology and Oncology

## 2015-11-12 ENCOUNTER — Encounter: Payer: Self-pay | Admitting: Physical Therapy

## 2015-11-13 DIAGNOSIS — M170 Bilateral primary osteoarthritis of knee: Secondary | ICD-10-CM | POA: Diagnosis not present

## 2015-11-14 ENCOUNTER — Other Ambulatory Visit: Payer: Self-pay | Admitting: General Surgery

## 2015-11-14 ENCOUNTER — Telehealth: Payer: Self-pay | Admitting: *Deleted

## 2015-11-14 ENCOUNTER — Encounter (HOSPITAL_COMMUNITY): Payer: Self-pay

## 2015-11-14 ENCOUNTER — Encounter: Payer: Self-pay | Admitting: Physical Therapy

## 2015-11-14 DIAGNOSIS — C50211 Malignant neoplasm of upper-inner quadrant of right female breast: Secondary | ICD-10-CM | POA: Diagnosis not present

## 2015-11-14 NOTE — Telephone Encounter (Signed)
Received Oncotype score of 30/20%. Scheduled and confirmed appt with Dr. Lindi Adie on 11/18/15 at 1145 for discussion. Physician team notified.

## 2015-11-18 ENCOUNTER — Encounter: Payer: Self-pay | Admitting: Hematology and Oncology

## 2015-11-18 ENCOUNTER — Other Ambulatory Visit: Payer: Self-pay

## 2015-11-18 ENCOUNTER — Ambulatory Visit (HOSPITAL_BASED_OUTPATIENT_CLINIC_OR_DEPARTMENT_OTHER): Payer: PPO | Admitting: Hematology and Oncology

## 2015-11-18 ENCOUNTER — Telehealth: Payer: Self-pay | Admitting: Hematology and Oncology

## 2015-11-18 VITALS — BP 160/78 | HR 79 | Temp 98.5°F | Resp 18 | Ht 61.25 in | Wt 223.3 lb

## 2015-11-18 DIAGNOSIS — C50211 Malignant neoplasm of upper-inner quadrant of right female breast: Secondary | ICD-10-CM | POA: Diagnosis not present

## 2015-11-18 MED ORDER — LORAZEPAM 0.5 MG PO TABS
0.5000 mg | ORAL_TABLET | Freq: Four times a day (QID) | ORAL | Status: DC | PRN
Start: 2015-11-18 — End: 2016-02-12

## 2015-11-18 MED ORDER — ONDANSETRON HCL 8 MG PO TABS: 8.0000 mg | ORAL_TABLET | Freq: Two times a day (BID) | ORAL | Status: DC | PRN

## 2015-11-18 MED ORDER — LIDOCAINE-PRILOCAINE 2.5-2.5 % EX CREA: TOPICAL_CREAM | CUTANEOUS | Status: DC

## 2015-11-18 MED ORDER — PROCHLORPERAZINE MALEATE 10 MG PO TABS: 10.0000 mg | ORAL_TABLET | Freq: Four times a day (QID) | ORAL | Status: DC | PRN

## 2015-11-18 NOTE — Progress Notes (Signed)
Patient Care Team: Jani Gravel, MD as PCP - General (Internal Medicine) Autumn Messing III, MD as Consulting Physician (General Surgery) Nicholas Lose, MD as Consulting Physician (Oncology) Gery Pray, MD as Consulting Physician (Radiation Oncology) Sylvan Cheese, NP as Nurse Practitioner (Hematology and Oncology)  DIAGNOSIS: Breast cancer of upper-inner quadrant of right female breast [facility])   Staging form: Breast, AJCC 7th Edition     Clinical stage from 10/23/2015: Stage IA (T1c, N0, M0) - Unsigned   SUMMARY OF ONCOLOGIC HISTORY:   Breast cancer of upper-inner quadrant of right female breast ([place])   10/14/2015 Initial Diagnosis Screening detected Right breast mass at 1:00:1.1 x 0.8 x 0.7 cm,grade 2 IDC plus DCIS, ER 100 present, PR 0%, HER-2 negative ratio 1.64, Ki-67 60%, T1cN0 stage IA   10/30/2015 Surgery Right lumpectomy: IDC grade 2, 1.4 cm, IG DCIS, margins negative, 0/2 lymph nodes negative, ER 100%, PR 0%, HER-2 negative, Ki-67 60 %, T1 cN0 stage IA, Oncotype DX score 30, 20% risk of recurrence    CHIEF COMPLIANT: Follow-up after lumpectomy to discuss Oncotype DX test  INTERVAL HISTORY: Shelley Lawrence is a 75 year old with above-mentioned history of right breast cancer treated with lumpectomy and had an Oncotype DX showing a score of 30 and she is here today to discuss the treatment plan. She reports that he is she is healing very well from the surgery.  REVIEW OF SYSTEMS:   Constitutional: Denies fevers, chills or abnormal weight loss Eyes: Denies blurriness of vision Ears, nose, mouth, throat, and face: Denies mucositis or sore throat Respiratory: Denies cough, dyspnea or wheezes Cardiovascular: Denies palpitation, chest discomfort Gastrointestinal:  Denies nausea, heartburn or change in bowel habits Skin: Denies abnormal skin rashes Lymphatics: Denies new lymphadenopathy or easy bruising Neurological:Denies numbness, tingling or new weaknesses Behavioral/Psych:  Mood is stable, no new changes  Extremities: No lower extremity edema Breast:  denies any pain or lumps or nodules in either breasts All other systems were reviewed with the patient and are negative.  I have reviewed the past medical history, past surgical history, social history and family history with the patient and they are unchanged from previous note.  ALLERGIES:  is allergic to horse-derived products.  MEDICATIONS:  Current Outpatient Prescriptions  Medication Sig Dispense Refill  . aspirin 81 MG tablet Take 81 mg by mouth daily.    Marland Kitchen atorvastatin (LIPITOR) 40 MG tablet Take 40 mg by mouth daily.    Marland Kitchen b complex vitamins capsule Take 1 capsule by mouth daily.    . brimonidine-timolol (COMBIGAN) 0.2-0.5 % ophthalmic solution Place 1 drop into the left eye every 12 (twelve) hours.    . cycloSPORINE (RESTASIS) 0.05 % ophthalmic emulsion Place 1 drop into both eyes 2 (two) times daily.    Marland Kitchen esomeprazole (NEXIUM) 40 MG capsule Take 1 capsule (40 mg total) by mouth daily before breakfast. 30 capsule 1  . fexofenadine (ALLEGRA) 180 MG tablet Take 180 mg by mouth daily.    . fluticasone (VERAMYST) 27.5 MCG/SPRAY nasal spray Place 2 sprays into the nose daily.    Marland Kitchen ibuprofen (ADVIL,MOTRIN) 400 MG tablet Take 400 mg by mouth every 6 (six) hours as needed.    . latanoprost (XALATAN) 0.005 % ophthalmic solution Place 1 drop into both eyes at bedtime.    . lidocaine-prilocaine (EMLA) cream Apply to affected area once 30 g 3  . lisinopril-hydrochlorothiazide (PRINZIDE,ZESTORETIC) 20-12.5 MG per tablet Take 1 tablet by mouth daily.     Marland Kitchen LORazepam (ATIVAN)  0.5 MG tablet Take 1 tablet (0.5 mg total) by mouth every 6 (six) hours as needed (Nausea or vomiting). 30 tablet 0  . metFORMIN (GLUCOPHAGE-XR) 500 MG 24 hr tablet Take 500 mg by mouth daily with breakfast.     . Multiple Vitamins-Minerals (MULTIVITAMIN WITH MINERALS) tablet Take 1 tablet by mouth daily.    . ondansetron (ZOFRAN) 8 MG tablet  Take 1 tablet (8 mg total) by mouth 2 (two) times daily as needed for refractory nausea / vomiting. Start on day 3 after chemo. 30 tablet 1  . ONETOUCH VERIO test strip     . oxyCODONE-acetaminophen (ROXICET) 5-325 MG tablet Take 1-2 tablets by mouth every 4 (four) hours as needed. 30 tablet 0  . potassium chloride (K-DUR) 10 MEQ tablet Take 10 mEq by mouth daily.    . prochlorperazine (COMPAZINE) 10 MG tablet Take 1 tablet (10 mg total) by mouth every 6 (six) hours as needed (Nausea or vomiting). 30 tablet 1  . Specialty Vitamins Products (MAGNESIUM, AMINO ACID CHELATE,) 133 MG tablet Take 1 tablet by mouth. '250mg'$     . traMADol (ULTRAM) 50 MG tablet Take 50 mg by mouth every 8 (eight) hours as needed.      No current facility-administered medications for this visit.    PHYSICAL EXAMINATION: ECOG PERFORMANCE STATUS: 0 - Asymptomatic  Filed Vitals:   11/18/15 1219  BP: 160/78  Pulse: 79  Temp: 98.5 F (36.9 C)  Resp: 18   Filed Weights   11/18/15 1219  Weight: 223 lb 4.8 oz (101.288 kg)    GENERAL:alert, no distress and comfortable SKIN: skin color, texture, turgor are normal, no rashes or significant lesions EYES: normal, Conjunctiva are pink and non-injected, sclera clear OROPHARYNX:no exudate, no erythema and lips, buccal mucosa, and tongue normal  NECK: supple, thyroid normal size, non-tender, without nodularity LYMPH:  no palpable lymphadenopathy in the cervical, axillary or inguinal LUNGS: clear to auscultation and percussion with normal breathing effort HEART: regular rate & rhythm and no murmurs and no lower extremity edema ABDOMEN:abdomen soft, non-tender and normal bowel sounds MUSCULOSKELETAL:no cyanosis of digits and no clubbing  NEURO: alert & oriented x 3 with fluent speech, no focal motor/sensory deficits EXTREMITIES: No lower extremity edema BREAST: No palpable masses or nodules in either right or left breasts. No palpable axillary supraclavicular or  infraclavicular adenopathy no breast tenderness or nipple discharge. (exam performed in the presence of a chaperone)  LABORATORY DATA:  I have reviewed the data as listed   Chemistry      Component Value Date/Time   NA 142 10/23/2015 1217   K 3.2* 10/23/2015 1217   CO2 30* 10/23/2015 1217   BUN 14.3 10/23/2015 1217   CREATININE 0.6 10/23/2015 1217      Component Value Date/Time   CALCIUM 9.6 10/23/2015 1217   ALKPHOS 94 10/23/2015 1217   AST 17 10/23/2015 1217   ALT 15 10/23/2015 1217   BILITOT 0.32 10/23/2015 1217       Lab Results  Component Value Date   WBC 8.7 10/23/2015   HGB 11.0* 10/23/2015   HCT 34.4* 10/23/2015   MCV 77.4* 10/23/2015   PLT 273 10/23/2015   NEUTROABS 5.8 10/23/2015     ASSESSMENT & PLAN:  Breast cancer of upper-inner quadrant of right female breast ([place]) Right lumpectomy 10/30/2015: IDC grade 2, 1.4 cm, IG DCIS, margins negative, 0/2 lymph nodes negative, ER 100%, PR 0%, HER-2 negative, Ki-67 60 %, T1 cN0 stage IA Oncotype DX score 30,  20% risk of recurrence  Oncotype DX counseling: I discussed Oncotype DX score result and provided her with a copy of this report.  Treatment plan: 1. Adjuvant chemotherapy With Taxotere and Cytoxan 4 2. Followed by adjuvant radiation therapy 3. Followed by adjuvant antiestrogen therapy  Chemotherapy counseling: I discussed risks and benefits of chemotherapy on patient appears to understand the risks and is consented to proceed with treatment. We will request for port placement  start chemotherapy after the port has been placed Chemotherapy class to be scheduled    Orders Placed This Encounter  Procedures  . CBC with Differential    Standing Status: Standing     Number of Occurrences: 20     Standing Expiration Date: 11/18/2016  . Comprehensive metabolic panel    Standing Status: Standing     Number of Occurrences: 20     Standing Expiration Date: 11/18/2016   The patient has a good understanding of  the overall plan. she agrees with it. she will call with any problems that may develop before the next visit here.   Rulon Eisenmenger, MD 11/18/2015

## 2015-11-18 NOTE — Telephone Encounter (Signed)
spoke w/' pt confirmed 6/16 apt °

## 2015-11-18 NOTE — Assessment & Plan Note (Signed)
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  Treatment plan: 1. Adjuvant chemotherapy 2. Followed by adjuvant radiation therapy 3. Followed by adjuvant antiestrogen therapy

## 2015-11-18 NOTE — Progress Notes (Signed)
Received VM from pt stating she was scheduled for chemotherapy education class on 11/21/15 which does not work for her schedule.  POF entered for pt to be rescheduled.

## 2015-11-19 ENCOUNTER — Encounter (HOSPITAL_BASED_OUTPATIENT_CLINIC_OR_DEPARTMENT_OTHER): Payer: Self-pay | Admitting: *Deleted

## 2015-11-19 ENCOUNTER — Encounter: Payer: Self-pay | Admitting: *Deleted

## 2015-11-19 ENCOUNTER — Encounter: Payer: Self-pay | Admitting: Physical Therapy

## 2015-11-20 ENCOUNTER — Telehealth: Payer: Self-pay | Admitting: Hematology and Oncology

## 2015-11-20 NOTE — Telephone Encounter (Signed)
Spoke with patient to give upcoming appt date/times per 6/13 pof

## 2015-11-21 ENCOUNTER — Ambulatory Visit (HOSPITAL_COMMUNITY): Payer: PPO

## 2015-11-21 ENCOUNTER — Other Ambulatory Visit: Payer: PPO

## 2015-11-21 ENCOUNTER — Encounter: Payer: Self-pay | Admitting: Physical Therapy

## 2015-11-21 ENCOUNTER — Encounter (HOSPITAL_BASED_OUTPATIENT_CLINIC_OR_DEPARTMENT_OTHER): Payer: Self-pay | Admitting: Certified Registered"

## 2015-11-21 ENCOUNTER — Ambulatory Visit (HOSPITAL_BASED_OUTPATIENT_CLINIC_OR_DEPARTMENT_OTHER): Payer: PPO | Admitting: Anesthesiology

## 2015-11-21 ENCOUNTER — Encounter: Payer: Self-pay | Admitting: Hematology and Oncology

## 2015-11-21 ENCOUNTER — Encounter (HOSPITAL_BASED_OUTPATIENT_CLINIC_OR_DEPARTMENT_OTHER)
Admission: RE | Disposition: A | Discharge status: Home / Self Care | Payer: Self-pay | Source: Ambulatory Visit | Attending: General Surgery

## 2015-11-21 ENCOUNTER — Ambulatory Visit (HOSPITAL_BASED_OUTPATIENT_CLINIC_OR_DEPARTMENT_OTHER)
Admission: RE | Admit: 2015-11-21 | Discharge: 2015-11-21 | Disposition: A | Discharge status: Home / Self Care | Payer: PPO | Source: Ambulatory Visit | Attending: General Surgery | Admitting: General Surgery

## 2015-11-21 DIAGNOSIS — C50919 Malignant neoplasm of unspecified site of unspecified female breast: Secondary | ICD-10-CM | POA: Diagnosis not present

## 2015-11-21 DIAGNOSIS — E119 Type 2 diabetes mellitus without complications: Secondary | ICD-10-CM | POA: Diagnosis not present

## 2015-11-21 DIAGNOSIS — C50211 Malignant neoplasm of upper-inner quadrant of right female breast: Secondary | ICD-10-CM | POA: Diagnosis not present

## 2015-11-21 DIAGNOSIS — Z79899 Other long term (current) drug therapy: Secondary | ICD-10-CM | POA: Diagnosis not present

## 2015-11-21 DIAGNOSIS — Z95828 Presence of other vascular implants and grafts: Secondary | ICD-10-CM

## 2015-11-21 DIAGNOSIS — I10 Essential (primary) hypertension: Secondary | ICD-10-CM | POA: Insufficient documentation

## 2015-11-21 DIAGNOSIS — Z7984 Long term (current) use of oral hypoglycemic drugs: Secondary | ICD-10-CM | POA: Diagnosis not present

## 2015-11-21 DIAGNOSIS — C50911 Malignant neoplasm of unspecified site of right female breast: Secondary | ICD-10-CM | POA: Diagnosis not present

## 2015-11-21 HISTORY — PX: PORTACATH PLACEMENT: SHX2246

## 2015-11-21 LAB — GLUCOSE, CAPILLARY
Glucose-Capillary: 140 mg/dL — ABNORMAL HIGH (ref 65–99)
Glucose-Capillary: 152 mg/dL — ABNORMAL HIGH (ref 65–99)

## 2015-11-21 SURGERY — INSERTION, TUNNELED CENTRAL VENOUS DEVICE, WITH PORT
Anesthesia: General | Site: Chest | Laterality: Left

## 2015-11-21 MED ORDER — DEXAMETHASONE SODIUM PHOSPHATE 4 MG/ML IJ SOLN
INTRAMUSCULAR | Status: DC | PRN
Start: 2015-11-21 — End: 2015-11-21
  Administered 2015-11-21: 4 mg via INTRAVENOUS

## 2015-11-21 MED ORDER — LIDOCAINE 2% (20 MG/ML) 5 ML SYRINGE
INTRAMUSCULAR | Status: AC
  Filled 2015-11-21: qty 5

## 2015-11-21 MED ORDER — LACTATED RINGERS IV SOLN
INTRAVENOUS | Status: DC
  Administered 2015-11-21: 08:00:00 via INTRAVENOUS

## 2015-11-21 MED ORDER — BUPIVACAINE HCL (PF) 0.25 % IJ SOLN
INTRAMUSCULAR | Status: DC | PRN
  Administered 2015-11-21: 10 mL

## 2015-11-21 MED ORDER — FENTANYL CITRATE (PF) 100 MCG/2ML IJ SOLN
25.0000 ug | INTRAMUSCULAR | Status: DC | PRN
  Administered 2015-11-21 (×2): 50 ug via INTRAVENOUS
  Administered 2015-11-21: 25 ug via INTRAVENOUS

## 2015-11-21 MED ORDER — HYDROCODONE-ACETAMINOPHEN 5-325 MG PO TABS: 1.0000 | ORAL_TABLET | ORAL | Status: DC | PRN

## 2015-11-21 MED ORDER — CHLORHEXIDINE GLUCONATE CLOTH 2 % EX PADS: 6.0000 | MEDICATED_PAD | Freq: Once | CUTANEOUS | Status: DC

## 2015-11-21 MED ORDER — ONDANSETRON HCL 4 MG/2ML IJ SOLN
INTRAMUSCULAR | Status: AC
  Filled 2015-11-21: qty 2

## 2015-11-21 MED ORDER — PROPOFOL 10 MG/ML IV BOLUS
INTRAVENOUS | Status: DC | PRN
  Administered 2015-11-21: 100 mg via INTRAVENOUS

## 2015-11-21 MED ORDER — GLYCOPYRROLATE 0.2 MG/ML IJ SOLN: 0.2000 mg | Freq: Once | INTRAMUSCULAR | Status: DC | PRN

## 2015-11-21 MED ORDER — MIDAZOLAM HCL 2 MG/2ML IJ SOLN: 1.0000 mg | INTRAMUSCULAR | Status: DC | PRN

## 2015-11-21 MED ORDER — CEFAZOLIN SODIUM-DEXTROSE 2-4 GM/100ML-% IV SOLN
INTRAVENOUS | Status: AC
  Filled 2015-11-21: qty 100

## 2015-11-21 MED ORDER — LIDOCAINE HCL (CARDIAC) 20 MG/ML IV SOLN
INTRAVENOUS | Status: DC | PRN
Start: 2015-11-21 — End: 2015-11-21
  Administered 2015-11-21: 60 mg via INTRAVENOUS

## 2015-11-21 MED ORDER — EPHEDRINE 5 MG/ML INJ
INTRAVENOUS | Status: AC
  Filled 2015-11-21: qty 10

## 2015-11-21 MED ORDER — FENTANYL CITRATE (PF) 100 MCG/2ML IJ SOLN
INTRAMUSCULAR | Status: AC
  Filled 2015-11-21: qty 2

## 2015-11-21 MED ORDER — CEFAZOLIN SODIUM-DEXTROSE 2-4 GM/100ML-% IV SOLN
2.0000 g | INTRAVENOUS | Status: AC
  Administered 2015-11-21: 2 g via INTRAVENOUS

## 2015-11-21 MED ORDER — HYDROCODONE-ACETAMINOPHEN 5-325 MG PO TABS
ORAL_TABLET | ORAL | Status: AC
  Filled 2015-11-21: qty 1

## 2015-11-21 MED ORDER — SCOPOLAMINE 1 MG/3DAYS TD PT72: 1.0000 | MEDICATED_PATCH | Freq: Once | TRANSDERMAL | Status: DC | PRN

## 2015-11-21 MED ORDER — DEXAMETHASONE SODIUM PHOSPHATE 10 MG/ML IJ SOLN
INTRAMUSCULAR | Status: AC
  Filled 2015-11-21: qty 1

## 2015-11-21 MED ORDER — FENTANYL CITRATE (PF) 100 MCG/2ML IJ SOLN
50.0000 ug | INTRAMUSCULAR | Status: DC | PRN
  Administered 2015-11-21: 50 ug via INTRAVENOUS

## 2015-11-21 MED ORDER — ONDANSETRON HCL 4 MG/2ML IJ SOLN: 4.0000 mg | Freq: Once | INTRAMUSCULAR | Status: DC | PRN

## 2015-11-21 MED ORDER — EPHEDRINE SULFATE 50 MG/ML IJ SOLN
INTRAMUSCULAR | Status: DC | PRN
  Administered 2015-11-21 (×2): 25 mg via INTRAVENOUS

## 2015-11-21 MED ORDER — HYDROCODONE-ACETAMINOPHEN 5-325 MG PO TABS
1.0000 | ORAL_TABLET | Freq: Once | ORAL | Status: AC
  Administered 2015-11-21: 1 via ORAL

## 2015-11-21 MED ORDER — HEPARIN (PORCINE) IN NACL 2-0.9 UNIT/ML-% IJ SOLN
INTRAMUSCULAR | Status: DC | PRN
  Administered 2015-11-21: 500 mL via INTRAVENOUS

## 2015-11-21 SURGICAL SUPPLY — 45 items
BAG DECANTER FOR FLEXI CONT (MISCELLANEOUS) ×3 IMPLANT
BLADE SURG 15 STRL LF DISP TIS (BLADE) ×1 IMPLANT
BLADE SURG 15 STRL SS (BLADE) ×3
CANISTER SUCT 1200ML W/VALVE (MISCELLANEOUS) IMPLANT
CHLORAPREP W/TINT 26ML (MISCELLANEOUS) ×3 IMPLANT
CLEANER CAUTERY TIP 5X5 PAD (MISCELLANEOUS) ×1 IMPLANT
COVER BACK TABLE 60X90IN (DRAPES) ×3 IMPLANT
COVER MAYO STAND STRL (DRAPES) ×3 IMPLANT
DECANTER SPIKE VIAL GLASS SM (MISCELLANEOUS) IMPLANT
DRAPE C-ARM 42X72 X-RAY (DRAPES) ×3 IMPLANT
DRAPE LAPAROSCOPIC ABDOMINAL (DRAPES) ×3 IMPLANT
DRAPE UTILITY XL STRL (DRAPES) ×3 IMPLANT
ELECT REM PT RETURN 9FT ADLT (ELECTROSURGICAL) ×3
ELECTRODE REM PT RTRN 9FT ADLT (ELECTROSURGICAL) ×1 IMPLANT
GLOVE BIO SURGEON STRL SZ7.5 (GLOVE) ×3 IMPLANT
GOWN STRL REUS W/ TWL LRG LVL3 (GOWN DISPOSABLE) ×2 IMPLANT
GOWN STRL REUS W/TWL LRG LVL3 (GOWN DISPOSABLE) ×6
IV KIT MINILOC 20X1 SAFETY (NEEDLE) IMPLANT
KIT PORT POWER 8FR ISP CVUE (Catheter) ×2 IMPLANT
LIQUID BAND (GAUZE/BANDAGES/DRESSINGS) ×3 IMPLANT
NDL HYPO 25X1 1.5 SAFETY (NEEDLE) ×1 IMPLANT
NDL SAFETY ECLIPSE 18X1.5 (NEEDLE) IMPLANT
NDL SPNL 22GX3.5 QUINCKE BK (NEEDLE) IMPLANT
NEEDLE HYPO 18GX1.5 SHARP (NEEDLE)
NEEDLE HYPO 22GX1.5 SAFETY (NEEDLE) IMPLANT
NEEDLE HYPO 25X1 1.5 SAFETY (NEEDLE) ×3 IMPLANT
NEEDLE SPNL 22GX3.5 QUINCKE BK (NEEDLE) IMPLANT
PACK BASIN DAY SURGERY FS (CUSTOM PROCEDURE TRAY) ×3 IMPLANT
PAD CLEANER CAUTERY TIP 5X5 (MISCELLANEOUS) ×2
PENCIL BUTTON HOLSTER BLD 10FT (ELECTRODE) ×3 IMPLANT
SLEEVE SCD COMPRESS KNEE MED (MISCELLANEOUS) ×2 IMPLANT
SUT MON AB 4-0 PC3 18 (SUTURE) ×3 IMPLANT
SUT PROLENE 2 0 SH DA (SUTURE) ×3 IMPLANT
SUT SILK 2 0 TIES 17X18 (SUTURE)
SUT SILK 2-0 18XBRD TIE BLK (SUTURE) IMPLANT
SUT VIC AB 3-0 SH 27 (SUTURE) ×3
SUT VIC AB 3-0 SH 27X BRD (SUTURE) ×1 IMPLANT
SYR 5ML LL (SYRINGE) ×3 IMPLANT
SYR CONTROL 10ML LL (SYRINGE) ×6 IMPLANT
SYRINGE 10CC LL (SYRINGE) ×2 IMPLANT
TOWEL OR 17X24 6PK STRL BLUE (TOWEL DISPOSABLE) ×6 IMPLANT
TOWEL OR NON WOVEN STRL DISP B (DISPOSABLE) ×3 IMPLANT
TUBE CONNECTING 20'X1/4 (TUBING)
TUBE CONNECTING 20X1/4 (TUBING) IMPLANT
YANKAUER SUCT BULB TIP NO VENT (SUCTIONS) IMPLANT

## 2015-11-21 NOTE — Progress Notes (Signed)
covermymeds- envision- approved 11/20/15-11/19/15 sent to medical records

## 2015-11-21 NOTE — Interval H&P Note (Signed)
History and Physical Interval Note:  11/21/2015 8:34 AM  Shelley Lawrence  has presented today for surgery, with the diagnosis of RIGHT BREAST CANCER  The various methods of treatment have been discussed with the patient and family. After consideration of risks, benefits and other options for treatment, the patient has consented to  Procedure(s): INSERTION PORT-A-CATH (N/A) as a surgical intervention .  The patient's history has been reviewed, patient examined, no change in status, stable for surgery.  I have reviewed the patient's chart and labs.  Questions were answered to the patient's satisfaction.     [name],Liel Rudden S

## 2015-11-21 NOTE — Anesthesia Procedure Notes (Signed)
Procedure Name: LMA Insertion Date/Time: 11/21/2015 8:57 AM Performed by: Senta Kantor D Pre-anesthesia Checklist: Patient identified, Emergency Drugs available, Suction available and Patient being monitored Patient Re-evaluated:Patient Re-evaluated prior to inductionOxygen Delivery Method: Circle system utilized Preoxygenation: Pre-oxygenation with 100% oxygen Intubation Type: IV induction Ventilation: Mask ventilation without difficulty LMA: LMA inserted LMA Size: 4.0 Number of attempts: 1 Airway Equipment and Method: Bite block Placement Confirmation: positive ETCO2 Tube secured with: Tape Dental Injury: Teeth and Oropharynx as per pre-operative assessment

## 2015-11-21 NOTE — Anesthesia Postprocedure Evaluation (Signed)
Anesthesia Post Note  Patient: Shelley Lawrence  Procedure(s) Performed: Procedure(s) (LRB): INSERTION PORT-A-CATH (Left)  Patient location during evaluation: PACU Anesthesia Type: General Level of consciousness: awake and alert Pain management: pain level controlled Vital Signs Assessment: post-procedure vital signs reviewed and stable Respiratory status: spontaneous breathing, nonlabored ventilation, respiratory function stable and patient connected to nasal cannula oxygen Cardiovascular status: blood pressure returned to baseline and stable Postop Assessment: no signs of nausea or vomiting Anesthetic complications: no    Last Vitals:  Filed Vitals:   11/21/15 1041 11/21/15 1050  BP:  126/77  Pulse: 85 77  Temp:  36.4 C  Resp: 18 16    Last Pain:  Filed Vitals:   11/21/15 1135  PainSc: 5                  Catalina Gravel

## 2015-11-21 NOTE — Transfer of Care (Signed)
Immediate Anesthesia Transfer of Care Note  Patient: Shelley Lawrence  Procedure(s) Performed: Procedure(s): INSERTION PORT-A-CATH (Left)  Patient Location: PACU  Anesthesia Type:General  Level of Consciousness: awake, alert , oriented and patient cooperative  Airway & Oxygen Therapy: Patient Spontanous Breathing and Patient connected to face mask oxygen  Post-op Assessment: Report given to RN and Post -op Vital signs reviewed and stable  Post vital signs: Reviewed and stable  Last Vitals:  Filed Vitals:   11/21/15 0800  BP: 157/64  Pulse: 79  Temp: 36.7 C  Resp: 18    Last Pain: There were no vitals filed for this visit.       Complications: No apparent anesthesia complications

## 2015-11-21 NOTE — Discharge Instructions (Signed)

## 2015-11-21 NOTE — Op Note (Signed)
11/21/2015  9:57 AM  PATIENT:  Shelley Lawrence  75 y.o. female  PRE-OPERATIVE DIAGNOSIS:  RIGHT BREAST CANCER  POST-OPERATIVE DIAGNOSIS:  Right Breast Cancer  PROCEDURE:  Procedure(s): INSERTION PORT-A-CATH (Left)  SURGEON:  Surgeon(s) and Role:    * Jovita Kussmaul, MD - Primary  PHYSICIAN ASSISTANT:   ASSISTANTS: none   ANESTHESIA:   general  EBL:  Total I/O In: 10 [I.V.:10] Out: -   BLOOD ADMINISTERED:none  DRAINS: none   LOCAL MEDICATIONS USED:  MARCAINE     SPECIMEN:  No Specimen  DISPOSITION OF SPECIMEN:  N/A  COUNTS:  YES  TOURNIQUET:  * No tourniquets in log *  DICTATION: .Dragon Dictation   After informed consent was obtained the patient was brought to the operating room and placed in the supine position on the operating room table. After adequate induction of general anesthesia the patient's left chest, breast, and neck area were prepped with ChloraPrep, allowed to dry, and draped in usual sterile manner. An appropriate timeout was performed. The patient was placed in Trendelenburg position. The area lateral to the bend of the clavicle and the left chest was infiltrated with quarter percent Marcaine. The large bore needle from the Port-A-Cath kit was used to slide beneath the bend of the clavicle heading towards the sternal notch and in doing so I was able to access the left subclavian vein without difficulty. A wire was fed through the needle without difficulty. The wire was confirmed in the central venous system using real-time fluoroscopy. Next an incision was made on the left chest wall at the wire entry site with a 15 blade knife. The incision was carried through the skin and subcutaneous tissue sharply with electrocautery. A subcutaneous pocket was created on the left chest wall inferior to the incision by blunt finger dissection. The tubing was then placed on the reservoir. The reservoir was placed in the pocket and the length of the tubing was estimated using  real-time fluoroscopy and cut to the appropriate length. Next the sheath and dilator were fed over the wire using the Seldinger technique without difficulty. The dilator and wire were removed. The tubing was fed through the sheath as far as it would go in and held in place while the sheath was gently cracked and separated. Another real-time fluoroscopy image showed the tip of the catheter to be in the distal superior vena cava. The tubing was then permanently anchored to the reservoir. The reservoir was anchored in the pocket with 2 2-0 Prolene stitches. The port was then aspirated and it aspirated blood easily. The port was then flushed initially with dilute heparin solution and then with the more concentrated heparin solution. The subcutaneous tissue was then closed over the port with interrupted 3-0 Vicryl stitches. The skin was then closed with a running 4-0 Monocryl subcuticular stitch. Dermabond dressings were applied. The patient tolerated the procedure well. At the end of the case all needle sponge and instrument counts were correct. The patient was then awakened and taken to recovery in stable condition.  PLAN OF CARE: Discharge to home after PACU  PATIENT DISPOSITION:  PACU - hemodynamically stable.   Delay start of Pharmacological VTE agent (>24hrs) due to surgical blood loss or risk of bleeding: not applicable

## 2015-11-21 NOTE — Anesthesia Preprocedure Evaluation (Addendum)
Anesthesia Evaluation  Patient identified by MRN, date of birth, ID band Patient awake    Reviewed: Allergy & Precautions, NPO status , Patient's Chart, lab work & pertinent test results  History of Anesthesia Complications Negative for: history of anesthetic complications  Airway Mallampati: II  TM Distance: >3 FB Neck ROM: Full    Dental  (+) Dental Advisory Given, Lower Dentures, Upper Dentures   Pulmonary neg pulmonary ROS,    Pulmonary exam normal breath sounds clear to auscultation       Cardiovascular hypertension, Pt. on medications + Valvular Problems/Murmurs AS  Rhythm:Regular Rate:Normal + Systolic murmurs    Neuro/Psych negative neurological ROS  negative psych ROS   GI/Hepatic Neg liver ROS, GERD  Medicated and Controlled,  Endo/Other  diabetes, Type 2, Oral Hypoglycemic AgentsMorbid obesity  Renal/GU negative Renal ROS     Musculoskeletal negative musculoskeletal ROS (+) Arthritis ,   Abdominal   Peds  Hematology  (+) Blood dyscrasia, anemia ,   Anesthesia Other Findings Day of surgery medications reviewed with the patient.  Reproductive/Obstetrics                           Anesthesia Physical Anesthesia Plan  ASA: III  Anesthesia Plan: General   Post-op Pain Management:    Induction: Intravenous  Airway Management Planned: LMA  Additional Equipment:   Intra-op Plan:   Post-operative Plan: Extubation in OR  Informed Consent: I have reviewed the patients History and Physical, chart, labs and discussed the procedure including the risks, benefits and alternatives for the proposed anesthesia with the patient or authorized representative who has indicated his/her understanding and acceptance.   Dental advisory given  Plan Discussed with: CRNA  Anesthesia Plan Comments: (Risks/benefits of general anesthesia discussed with patient including risk of damage to teeth,  lips, gum, and tongue, nausea/vomiting, allergic reactions to medications, and the possibility of heart attack, stroke and death.  All patient questions answered.  Patient wishes to proceed.)        Anesthesia Quick Evaluation                                  Anesthesia Evaluation  Patient identified by MRN, date of birth, ID band Patient awake    Reviewed: Allergy & Precautions, NPO status , Patient's Chart, lab work & pertinent test results  Airway Mallampati: I  TM Distance: >3 FB Neck ROM: Full    Dental  (+) Teeth Intact, Dental Advisory Given   Pulmonary    breath sounds clear to auscultation       Cardiovascular hypertension, Pt. on medications  Rhythm:Regular Rate:Normal     Neuro/Psych    GI/Hepatic GERD  Medicated and Controlled,  Endo/Other  diabetes, Type 2, Oral Hypoglycemic AgentsMorbid obesity  Renal/GU      Musculoskeletal   Abdominal   Peds  Hematology   Anesthesia Other Findings   Reproductive/Obstetrics                             Anesthesia Physical Anesthesia Plan  ASA: III  Anesthesia Plan: General   Post-op Pain Management: GA combined w/ Regional for post-op pain   Induction: Intravenous  Airway Management Planned: LMA  Additional Equipment:   Intra-op Plan:   Post-operative Plan: Extubation in OR  Informed Consent: I have reviewed the patients History and  Physical, chart, labs and discussed the procedure including the risks, benefits and alternatives for the proposed anesthesia with the patient or authorized representative who has indicated his/her understanding and acceptance.   Dental advisory given  Plan Discussed with: CRNA, Anesthesiologist and Surgeon  Anesthesia Plan Comments:         Anesthesia Quick Evaluation

## 2015-11-21 NOTE — H&P (Addendum)
Shelley Lawrence  Location: [facility] Patient #: [id number] DOB: [birth year] Undefined / Language: [name] / Race: Black or African American Female   History of Present Illness  The patient is a 75 year old female who presents with breast cancer. We are asked to see the patient in consultation by Dr. Sondra Come to evaluate her for a right breast cancer. The patient is a 75 year old black female who recently went for a routine screening mammogram. At that time she was found to have small mass in the upper inner right breast. this measured 1.1cm by u/s. It was biopsied and came back as a grade 2 breast cancer. It was ER+, PR-, and Her2 - with a Ki67 of 60%. Her axilla looked negative. She denied breast pain or discharge from the nipple. she did develop a large hematoma at the biopsy site.   Other Problems  Arthritis Back Pain Diabetes Mellitus Gastric Ulcer Gastroesophageal Reflux Disease Heart murmur High blood pressure Hypercholesterolemia Umbilical Hernia Repair  Past Surgical History Breast Biopsy Right. Hysterectomy (not due to cancer) - Complete Tonsillectomy  Diagnostic Studies History Colonoscopy 1-5 years ago Mammogram within last year Pap Smear >5 years ago  Medication History  Medications Reconciled  Social History Caffeine use Carbonated beverages. No alcohol use No drug use Tobacco use Never smoker.  Family History  Alcohol Abuse Father. Arthritis Father. Cerebrovascular Accident Mother. Diabetes Mellitus Mother. Hypertension Mother. Respiratory Condition Father.  Pregnancy / Birth History  Age at menarche 89 years. Age of menopause <46 Contraceptive History Oral contraceptives. Gravida 0    Review of Systems  General Present- Night Sweats. Not Present- Appetite Loss, Chills, Fatigue, Fever, Weight Gain and Weight Loss. Skin Not Present- Change in Wart/Mole, Dryness, Hives, Jaundice, New Lesions,  Non-Healing Wounds, Rash and Ulcer. HEENT Present- Seasonal Allergies and Wears glasses/contact lenses. Not Present- Earache, Hearing Loss, Hoarseness, Nose Bleed, Oral Ulcers, Ringing in the Ears, Sinus Pain, Sore Throat, Visual Disturbances and Yellow Eyes. Respiratory Present- Snoring. Not Present- Bloody sputum, Chronic Cough, Difficulty Breathing and Wheezing. Breast Not Present- Breast Mass, Breast Pain, Nipple Discharge and Skin Changes. Cardiovascular Present- Swelling of Extremities. Not Present- Chest Pain, Difficulty Breathing Lying Down, Leg Cramps, Palpitations, Rapid Heart Rate and Shortness of Breath. Gastrointestinal Not Present- Abdominal Pain, Bloating, Bloody Stool, Change in Bowel Habits, Chronic diarrhea, Constipation, Difficulty Swallowing, Excessive gas, Gets full quickly at meals, Hemorrhoids, Indigestion, Nausea, Rectal Pain and Vomiting. Female Genitourinary Present- Frequency, Nocturia and Urgency. Not Present- Painful Urination and Pelvic Pain. Musculoskeletal Present- Joint Pain. Not Present- Back Pain, Joint Stiffness, Muscle Pain, Muscle Weakness and Swelling of Extremities. Neurological Present- Tingling and Trouble walking. Not Present- Decreased Memory, Fainting, Headaches, Numbness, Seizures, Tremor and Weakness. Psychiatric Not Present- Anxiety, Bipolar, Change in Sleep Pattern, Depression, Fearful and Frequent crying. Endocrine Present- Hot flashes. Not Present- Cold Intolerance, Excessive Hunger, Hair Changes, Heat Intolerance and New Diabetes. Hematology Present- Excessive bleeding. Not Present- Easy Bruising, Gland problems, HIV and Persistent Infections.   Physical Exam  General Mental Status-Alert. General Appearance-Consistent with stated age. Hydration-Well hydrated. Voice-Normal.  Head and Neck Head-normocephalic, atraumatic with no lesions or palpable masses. Trachea-midline. Thyroid Gland Characteristics - normal size and  consistency.  Eye Eyeball - Bilateral-Extraocular movements intact. Sclera/Conjunctiva - Bilateral-No scleral icterus.  Chest and Lung Exam Chest and lung exam reveals -quiet, even and easy respiratory effort with no use of accessory muscles and on auscultation, normal breath sounds, no adventitious sounds and normal vocal resonance. Inspection Chest  Wall - Normal. Back - normal.  Breast Note: There is a large palpable bruise in the upper inner right breast. There is no other palpable mass in either breast. There is no palpable axillary, supraclavicular, or cervical lymphadenopathy.   Cardiovascular Cardiovascular examination reveals -normal pedal pulses bilaterally. Note: there is an audible systolic murmur  Abdomen Inspection Inspection of the abdomen reveals - No Hernias. Skin - Scar - no surgical scars. Palpation/Percussion Palpation and Percussion of the abdomen reveal - Soft, Non Tender, No Rebound tenderness, No Rigidity (guarding) and No hepatosplenomegaly. Auscultation Auscultation of the abdomen reveals - Bowel sounds normal.  Neurologic Neurologic evaluation reveals -alert and oriented x 3 with no impairment of recent or remote memory. Mental Status-Normal.  Musculoskeletal Normal Exam - Left-Upper Extremity Strength Normal and Lower Extremity Strength Normal. Normal Exam - Right-Upper Extremity Strength Normal and Lower Extremity Strength Normal.  Lymphatic Head & Neck  General Head & Neck Lymphatics: Bilateral - Description - Normal. Axillary  General Axillary Region: Bilateral - Description - Normal. Tenderness - Non Tender. Femoral & Inguinal  Generalized Femoral & Inguinal Lymphatics: Bilateral - Description - Normal. Tenderness - Non Tender.    Assessment & Plan  BREAST CANCER OF UPPER-INNER QUADRANT OF RIGHT FEMALE BREAST (C50.211) Impression:The patient has had a right lumpectomy and sentinel node mapping. She will receive chemo  and will need a port. I have discussed with her the risks and benefits of the surgery as well as some of the technical aspects and she understands and wishes to proceed

## 2015-11-22 ENCOUNTER — Telehealth: Payer: Self-pay

## 2015-11-22 ENCOUNTER — Other Ambulatory Visit: Payer: PPO

## 2015-11-22 ENCOUNTER — Encounter (HOSPITAL_BASED_OUTPATIENT_CLINIC_OR_DEPARTMENT_OTHER): Payer: Self-pay | Admitting: General Surgery

## 2015-11-22 NOTE — Telephone Encounter (Signed)
Received VM from pt inquiring about emla cream and ativan prescriptions.  Called pharmacy to confirm they received emla cream prescription in which they did.  Faxed ativan prescription over.  Called pt to inform her that this would both be available to her at her convenience.  Instructed pt on how to properly apply emla cream for pt's first treatment scheduled for Monday.  Pt verbalized understanding and without further questions or concerns at time of call.

## 2015-11-25 ENCOUNTER — Other Ambulatory Visit (HOSPITAL_BASED_OUTPATIENT_CLINIC_OR_DEPARTMENT_OTHER): Payer: PPO

## 2015-11-25 ENCOUNTER — Encounter: Payer: Self-pay | Admitting: *Deleted

## 2015-11-25 ENCOUNTER — Ambulatory Visit (HOSPITAL_BASED_OUTPATIENT_CLINIC_OR_DEPARTMENT_OTHER): Payer: PPO

## 2015-11-25 ENCOUNTER — Ambulatory Visit (HOSPITAL_BASED_OUTPATIENT_CLINIC_OR_DEPARTMENT_OTHER): Payer: PPO | Admitting: Hematology and Oncology

## 2015-11-25 ENCOUNTER — Encounter: Payer: Self-pay | Admitting: Hematology and Oncology

## 2015-11-25 VITALS — BP 159/74 | HR 78 | Temp 97.6°F | Resp 18

## 2015-11-25 VITALS — BP 155/64 | HR 84 | Temp 98.2°F | Resp 18 | Wt 223.5 lb

## 2015-11-25 DIAGNOSIS — Z5189 Encounter for other specified aftercare: Secondary | ICD-10-CM

## 2015-11-25 DIAGNOSIS — Z5111 Encounter for antineoplastic chemotherapy: Secondary | ICD-10-CM | POA: Diagnosis not present

## 2015-11-25 DIAGNOSIS — C50211 Malignant neoplasm of upper-inner quadrant of right female breast: Secondary | ICD-10-CM | POA: Diagnosis not present

## 2015-11-25 LAB — COMPREHENSIVE METABOLIC PANEL
ALBUMIN: 3.5 g/dL (ref 3.5–5.0)
ALK PHOS: 101 U/L (ref 40–150)
ALT: 19 U/L (ref 0–55)
AST: 17 U/L (ref 5–34)
Anion Gap: 9 mEq/L (ref 3–11)
BUN: 12.5 mg/dL (ref 7.0–26.0)
CHLORIDE: 107 meq/L (ref 98–109)
CO2: 26 mEq/L (ref 22–29)
Calcium: 9.5 mg/dL (ref 8.4–10.4)
Creatinine: 0.7 mg/dL (ref 0.6–1.1)
EGFR: >90 mL/min/{1.73_m2} (ref >90)
GLUCOSE: 135 mg/dL (ref 70–140)
POTASSIUM: 3.7 meq/L (ref 3.5–5.1)
SODIUM: 142 meq/L (ref 136–145)
Total Bilirubin: 0.34 mg/dL (ref 0.20–1.20)
Total Protein: 6.6 g/dL (ref 6.4–8.3)

## 2015-11-25 LAB — CBC WITH DIFFERENTIAL/PLATELET
BASO%: 0.2 % (ref 0.0–2.0)
Basophils Absolute: 0 10*3/uL (ref 0.0–0.1)
EOS%: 11.2 % — AB (ref 0.0–7.0)
Eosinophils Absolute: 1 10*3/uL — ABNORMAL HIGH (ref 0.0–0.5)
HCT: 34.7 % — ABNORMAL LOW (ref 34.8–46.6)
HEMOGLOBIN: 11.2 g/dL — AB (ref 11.6–15.9)
LYMPH%: 19.4 % (ref 14.0–49.7)
MCH: 25.3 pg (ref 25.1–34.0)
MCHC: 32.3 g/dL (ref 31.5–36.0)
MCV: 78.5 fL — ABNORMAL LOW (ref 79.5–101.0)
MONO#: 0.7 10*3/uL (ref 0.1–0.9)
MONO%: 8.5 % (ref 0.0–14.0)
NEUT#: 5.3 10*3/uL (ref 1.5–6.5)
NEUT%: 60.7 % (ref 38.4–76.8)
Platelets: 250 10*3/uL (ref 145–400)
RBC: 4.42 10*6/uL (ref 3.70–5.45)
RDW: 14.2 % (ref 11.2–14.5)
WBC: 8.7 10*3/uL (ref 3.9–10.3)
lymph#: 1.7 10*3/uL (ref 0.9–3.3)

## 2015-11-25 MED ORDER — SODIUM CHLORIDE 0.9 % IV SOLN
600.0000 mg/m2 | Freq: Once | INTRAVENOUS | Status: AC
  Administered 2015-11-25: 1260 mg via INTRAVENOUS
  Filled 2015-11-25: qty 63

## 2015-11-25 MED ORDER — PALONOSETRON HCL INJECTION 0.25 MG/5ML
INTRAVENOUS | Status: AC
  Filled 2015-11-25: qty 5

## 2015-11-25 MED ORDER — SODIUM CHLORIDE 0.9% FLUSH
10.0000 mL | INTRAVENOUS | Status: DC | PRN
  Administered 2015-11-25: 10 mL
  Filled 2015-11-25: qty 10

## 2015-11-25 MED ORDER — SODIUM CHLORIDE 0.9 % IV SOLN
Freq: Once | INTRAVENOUS | Status: AC
  Administered 2015-11-25: 12:00:00 via INTRAVENOUS
  Filled 2015-11-25: qty 5

## 2015-11-25 MED ORDER — PALONOSETRON HCL INJECTION 0.25 MG/5ML
0.2500 mg | Freq: Once | INTRAVENOUS | Status: AC
  Administered 2015-11-25: 0.25 mg via INTRAVENOUS

## 2015-11-25 MED ORDER — HEPARIN SOD (PORK) LOCK FLUSH 100 UNIT/ML IV SOLN
500.0000 [IU] | Freq: Once | INTRAVENOUS | Status: AC | PRN
Start: 2015-11-25 — End: 2015-11-25
  Administered 2015-11-25: 500 [IU]
  Filled 2015-11-25: qty 5

## 2015-11-25 MED ORDER — SODIUM CHLORIDE 0.9 % IV SOLN
Freq: Once | INTRAVENOUS | Status: AC
  Administered 2015-11-25: 11:00:00 via INTRAVENOUS

## 2015-11-25 MED ORDER — PEGFILGRASTIM 6 MG/0.6ML ~~LOC~~ PSKT
6.0000 mg | PREFILLED_SYRINGE | Freq: Once | SUBCUTANEOUS | Status: AC
  Administered 2015-11-25: 6 mg via SUBCUTANEOUS
  Filled 2015-11-25: qty 0.6

## 2015-11-25 MED ORDER — DOCETAXEL CHEMO INJECTION 160 MG/16ML
75.0000 mg/m2 | Freq: Once | INTRAVENOUS | Status: AC
  Administered 2015-11-25: 160 mg via INTRAVENOUS
  Filled 2015-11-25: qty 16

## 2015-11-25 NOTE — Assessment & Plan Note (Signed)
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  Treatment plan: 1. Adjuvant chemotherapy With Taxotere and Cytoxan 4 2. Followed by adjuvant radiation therapy 3. Followed by adjuvant antiestrogen therapy ------------------------------------------------------------------------------------------ Current Treatment: cycle 1 day 1 Taxotere Cytoxan Anti-emetics were reviewed Labs reviewed RTC 1 week for toxicity check.

## 2015-11-25 NOTE — Patient Instructions (Signed)
[facility] Discharge Instructions for Patients Receiving Chemotherapy  Today you received the following chemotherapy agents:  Taxotere and Cytoxan.  To help prevent nausea and vomiting after your treatment, we encourage you to take your nausea medication: Compazine 10 mg every 6 hours as needed and Zofran 8 mg every 12 hours as needed.   If you develop nausea and vomiting that is not controlled by your nausea medication, call the clinic.   BELOW ARE SYMPTOMS THAT SHOULD BE REPORTED IMMEDIATELY:  *FEVER GREATER THAN 100.5 F  *CHILLS WITH OR WITHOUT FEVER  NAUSEA AND VOMITING THAT IS NOT CONTROLLED WITH YOUR NAUSEA MEDICATION  *UNUSUAL SHORTNESS OF BREATH  *UNUSUAL BRUISING OR BLEEDING  TENDERNESS IN MOUTH AND THROAT WITH OR WITHOUT PRESENCE OF ULCERS  *URINARY PROBLEMS  *BOWEL PROBLEMS  UNUSUAL RASH Items with * indicate a potential emergency and should be followed up as soon as possible.  Feel free to call the clinic you have any questions or concerns. The clinic phone number is [phone].  Please show the [facility] at check-in to the Emergency Department and triage nurse.    Docetaxel injection What is this medicine? DOCETAXEL (doe se TAX el) is a chemotherapy drug. It targets fast dividing cells, like cancer cells, and causes these cells to die. This medicine is used to treat many types of cancers like breast cancer, certain stomach cancers, head and neck cancer, lung cancer, and prostate cancer. This medicine may be used for other purposes; ask your health care provider or pharmacist if you have questions. What should I tell my health care provider before I take this medicine? They need to know if you have any of these conditions: -infection (especially a virus infection such as chickenpox, cold sores, or herpes) -liver disease -low blood counts, like low white cell, platelet, or red cell counts -an unusual or allergic reaction to docetaxel,  polysorbate 80, other chemotherapy agents, other medicines, foods, dyes, or preservatives -pregnant or trying to get pregnant -breast-feeding How should I use this medicine? This drug is given as an infusion into a vein. It is administered in a hospital or clinic by a specially trained health care professional. Talk to your pediatrician regarding the use of this medicine in children. Special care may be needed. Overdosage: If you think you have taken too much of this medicine contact a poison control center or emergency room at once. NOTE: This medicine is only for you. Do not share this medicine with others. What if I miss a dose? It is important not to miss your dose. Call your doctor or health care professional if you are unable to keep an appointment. What may interact with this medicine? -cyclosporine -erythromycin -ketoconazole -medicines to increase blood counts like filgrastim, pegfilgrastim, sargramostim -vaccines Talk to your doctor or health care professional before taking any of these medicines: -acetaminophen -aspirin -ibuprofen -ketoprofen -naproxen This list may not describe all possible interactions. Give your health care provider a list of all the medicines, herbs, non-prescription drugs, or dietary supplements you use. Also tell them if you smoke, drink alcohol, or use illegal drugs. Some items may interact with your medicine. What should I watch for while using this medicine? Your condition will be monitored carefully while you are receiving this medicine. You will need important blood work done while you are taking this medicine. This drug may make you feel generally unwell. This is not uncommon, as chemotherapy can affect healthy cells as well as cancer cells. Report  any side effects. Continue your course of treatment even though you feel ill unless your doctor tells you to stop. In some cases, you may be given additional medicines to help with side effects. Follow all  directions for their use. Call your doctor or health care professional for advice if you get a fever, chills or sore throat, or other symptoms of a cold or flu. Do not treat yourself. This drug decreases your body's ability to fight infections. Try to avoid being around people who are sick. This medicine may increase your risk to bruise or bleed. Call your doctor or health care professional if you notice any unusual bleeding. This medicine may contain alcohol in the product. You may get drowsy or dizzy. Do not drive, use machinery, or do anything that needs mental alertness until you know how this medicine affects you. Do not stand or sit up quickly, especially if you are an older patient. This reduces the risk of dizzy or fainting spells. Avoid alcoholic drinks. Do not become pregnant while taking this medicine. Women should inform their doctor if they wish to become pregnant or think they might be pregnant. There is a potential for serious side effects to an unborn child. Talk to your health care professional or pharmacist for more information. Do not breast-feed an infant while taking this medicine. What side effects may I notice from receiving this medicine? Side effects that you should report to your doctor or health care professional as soon as possible: -allergic reactions like skin rash, itching or hives, swelling of the face, lips, or tongue -low blood counts - This drug may decrease the number of white blood cells, red blood cells and platelets. You may be at increased risk for infections and bleeding. -signs of infection - fever or chills, cough, sore throat, pain or difficulty passing urine -signs of decreased platelets or bleeding - bruising, pinpoint red spots on the skin, black, tarry stools, nosebleeds -signs of decreased red blood cells - unusually weak or tired, fainting spells, lightheadedness -breathing problems -fast or irregular heartbeat -low blood pressure -mouth sores -nausea  and vomiting -pain, swelling, redness or irritation at the injection site -pain, tingling, numbness in the hands or feet -swelling of the ankle, feet, hands -weight gain Side effects that usually do not require medical attention (report to your prescriber or health care professional if they continue or are bothersome): -bone pain -complete hair loss including hair on your head, underarms, pubic hair, eyebrows, and eyelashes -diarrhea -excessive tearing -changes in the color of fingernails -loosening of the fingernails -nausea -muscle pain -red flush to skin -sweating -weak or tired This list may not describe all possible side effects. Call your doctor for medical advice about side effects. You may report side effects to FDA at 1-800-FDA-1088. Where should I keep my medicine? This drug is given in a hospital or clinic and will not be stored at home. NOTE: This sheet is a summary. It may not cover all possible information. If you have questions about this medicine, talk to your doctor, pharmacist, or health care provider.    2016, Elsevier/Gold Standard. (2014-06-11 16:04:57)   Cyclophosphamide injection What is this medicine? CYCLOPHOSPHAMIDE (sye kloe FOSS fa mide) is a chemotherapy drug. It slows the growth of cancer cells. This medicine is used to treat many types of cancer like lymphoma, myeloma, leukemia, breast cancer, and ovarian cancer, to name a few. This medicine may be used for other purposes; ask your health care provider or pharmacist if  you have questions. What should I tell my health care provider before I take this medicine? They need to know if you have any of these conditions: -blood disorders -history of other chemotherapy -infection -kidney disease -liver disease -recent or ongoing radiation therapy -tumors in the bone marrow -an unusual or allergic reaction to cyclophosphamide, other chemotherapy, other medicines, foods, dyes, or preservatives -pregnant or  trying to get pregnant -breast-feeding How should I use this medicine? This drug is usually given as an injection into a vein or muscle or by infusion into a vein. It is administered in a hospital or clinic by a specially trained health care professional. Talk to your pediatrician regarding the use of this medicine in children. Special care may be needed. Overdosage: If you think you have taken too much of this medicine contact a poison control center or emergency room at once. NOTE: This medicine is only for you. Do not share this medicine with others. What if I miss a dose? It is important not to miss your dose. Call your doctor or health care professional if you are unable to keep an appointment. What may interact with this medicine? This medicine may interact with the following medications: -amiodarone -amphotericin B -azathioprine -certain antiviral medicines for HIV or AIDS such as protease inhibitors (e.g., indinavir, ritonavir) and zidovudine -certain blood pressure medications such as benazepril, captopril, enalapril, fosinopril, lisinopril, moexipril, monopril, perindopril, quinapril, ramipril, trandolapril -certain cancer medications such as anthracyclines (e.g., daunorubicin, doxorubicin), busulfan, cytarabine, paclitaxel, pentostatin, tamoxifen, trastuzumab -certain diuretics such as chlorothiazide, chlorthalidone, hydrochlorothiazide, indapamide, metolazone -certain medicines that treat or prevent blood clots like warfarin -certain muscle relaxants such as succinylcholine -cyclosporine -etanercept -indomethacin -medicines to increase blood counts like filgrastim, pegfilgrastim, sargramostim -medicines used as general anesthesia -metronidazole -natalizumab This list may not describe all possible interactions. Give your health care provider a list of all the medicines, herbs, non-prescription drugs, or dietary supplements you use. Also tell them if you smoke, drink alcohol, or  use illegal drugs. Some items may interact with your medicine. What should I watch for while using this medicine? Visit your doctor for checks on your progress. This drug may make you feel generally unwell. This is not uncommon, as chemotherapy can affect healthy cells as well as cancer cells. Report any side effects. Continue your course of treatment even though you feel ill unless your doctor tells you to stop. Drink water or other fluids as directed. Urinate often, even at night. In some cases, you may be given additional medicines to help with side effects. Follow all directions for their use. Call your doctor or health care professional for advice if you get a fever, chills or sore throat, or other symptoms of a cold or flu. Do not treat yourself. This drug decreases your body's ability to fight infections. Try to avoid being around people who are sick. This medicine may increase your risk to bruise or bleed. Call your doctor or health care professional if you notice any unusual bleeding. Be careful brushing and flossing your teeth or using a toothpick because you may get an infection or bleed more easily. If you have any dental work done, tell your dentist you are receiving this medicine. You may get drowsy or dizzy. Do not drive, use machinery, or do anything that needs mental alertness until you know how this medicine affects you. Do not become pregnant while taking this medicine or for 1 year after stopping it. Women should inform their doctor if they wish to  become pregnant or think they might be pregnant. Men should not father a child while taking this medicine and for 4 months after stopping it. There is a potential for serious side effects to an unborn child. Talk to your health care professional or pharmacist for more information. Do not breast-feed an infant while taking this medicine. This medicine may interfere with the ability to have a child. This medicine has caused ovarian failure in  some women. This medicine has caused reduced sperm counts in some men. You should talk with your doctor or health care professional if you are concerned about your fertility. If you are going to have surgery, tell your doctor or health care professional that you have taken this medicine. What side effects may I notice from receiving this medicine? Side effects that you should report to your doctor or health care professional as soon as possible: -allergic reactions like skin rash, itching or hives, swelling of the face, lips, or tongue -low blood counts - this medicine may decrease the number of white blood cells, red blood cells and platelets. You may be at increased risk for infections and bleeding. -signs of infection - fever or chills, cough, sore throat, pain or difficulty passing urine -signs of decreased platelets or bleeding - bruising, pinpoint red spots on the skin, black, tarry stools, blood in the urine -signs of decreased red blood cells - unusually weak or tired, fainting spells, lightheadedness -breathing problems -dark urine -dizziness -palpitations -swelling of the ankles, feet, hands -trouble passing urine or change in the amount of urine -weight gain -yellowing of the eyes or skin Side effects that usually do not require medical attention (report to your doctor or health care professional if they continue or are bothersome): -changes in nail or skin color -hair loss -missed menstrual periods -mouth sores -nausea, vomiting This list may not describe all possible side effects. Call your doctor for medical advice about side effects. You may report side effects to FDA at 1-800-FDA-1088. Where should I keep my medicine? This drug is given in a hospital or clinic and will not be stored at home. NOTE: This sheet is a summary. It may not cover all possible information. If you have questions about this medicine, talk to your doctor, pharmacist, or health care provider.    2016,  [employer]vier/Gold Standard. (2012-04-08 16:22:58)

## 2015-11-25 NOTE — Progress Notes (Signed)
Patient Care Team: Jani Gravel, MD as PCP - General (Internal Medicine) Autumn Messing III, MD as Consulting Physician (General Surgery) Nicholas Lose, MD as Consulting Physician (Oncology) Gery Pray, MD as Consulting Physician (Radiation Oncology) Sylvan Cheese, NP as Nurse Practitioner (Hematology and Oncology)  DIAGNOSIS: Breast cancer of upper-inner quadrant of right female breast [facility])   Staging form: Breast, AJCC 7th Edition     Clinical stage from 10/23/2015: Stage IA (T1c, N0, M0) - Unsigned   SUMMARY OF ONCOLOGIC HISTORY:   Breast cancer of upper-inner quadrant of right female breast ([place])   10/14/2015 Initial Diagnosis Screening detected Right breast mass at 1:00:1.1 x 0.8 x 0.7 cm,grade 2 IDC plus DCIS, ER 100 present, PR 0%, HER-2 negative ratio 1.64, Ki-67 60%, T1cN0 stage IA   10/30/2015 Surgery Right lumpectomy: IDC grade 2, 1.4 cm, IG DCIS, margins negative, 0/2 lymph nodes negative, ER 100%, PR 0%, HER-2 negative, Ki-67 60 %, T1 cN0 stage IA, Oncotype DX score 30, 20% risk of recurrence   11/25/2015 -  Chemotherapy Adjuvant chemotherapy with dose dense Adriamycin and Cytoxan 4 followed by Abraxane weekly 12    CHIEF COMPLIANT: Cycle 1 day 1 dose dense Adriamycin and Cytoxan  INTERVAL HISTORY: Shelley Lawrence is a 75 year old with above-mentioned history of right breast cancer starting adjuvant chemotherapy with dose dense Adriamycin and Cytoxan. She picked up all of her medications. We are not giving her dexamethasone. She is anxious to get started with the treatment. She has healed very well from prior surgery. She underwent a left port placement.  REVIEW OF SYSTEMS:   Constitutional: Denies fevers, chills or abnormal weight loss Eyes: Denies blurriness of vision Ears, nose, mouth, throat, and face: Denies mucositis or sore throat Respiratory: Denies cough, dyspnea or wheezes Cardiovascular: Denies palpitation, chest discomfort Gastrointestinal:  Denies nausea,  heartburn or change in bowel habits Skin: Denies abnormal skin rashes Lymphatics: Denies new lymphadenopathy or easy bruising Neurological:Denies numbness, tingling or new weaknesses Behavioral/Psych: Mood is stable, no new changes  Extremities: No lower extremity edema Breast:  denies any pain or lumps or nodules in either breasts All other systems were reviewed with the patient and are negative.  I have reviewed the past medical history, past surgical history, social history and family history with the patient and they are unchanged from previous note.  ALLERGIES:  is allergic to horse-derived products.  MEDICATIONS:  Current Outpatient Prescriptions  Medication Sig Dispense Refill  . aspirin 81 MG tablet Take 81 mg by mouth daily.    Marland Kitchen atorvastatin (LIPITOR) 40 MG tablet Take 40 mg by mouth daily.    Marland Kitchen b complex vitamins capsule Take 1 capsule by mouth daily.    . brimonidine-timolol (COMBIGAN) 0.2-0.5 % ophthalmic solution Place 1 drop into the left eye every 12 (twelve) hours.    . cycloSPORINE (RESTASIS) 0.05 % ophthalmic emulsion Place 1 drop into both eyes 2 (two) times daily.    Marland Kitchen esomeprazole (NEXIUM) 40 MG capsule Take 1 capsule (40 mg total) by mouth daily before breakfast. 30 capsule 1  . fexofenadine (ALLEGRA) 180 MG tablet Take 180 mg by mouth daily.    . fluticasone (VERAMYST) 27.5 MCG/SPRAY nasal spray Place 2 sprays into the nose daily.    Marland Kitchen HYDROcodone-acetaminophen (NORCO/VICODIN) 5-325 MG tablet Take 1-2 tablets by mouth every 4 (four) hours as needed for moderate pain or severe pain. 40 tablet 0  . ibuprofen (ADVIL,MOTRIN) 400 MG tablet Take 400 mg by mouth every 6 (six)  hours as needed.    . latanoprost (XALATAN) 0.005 % ophthalmic solution Place 1 drop into both eyes at bedtime.    . lidocaine-prilocaine (EMLA) cream Apply to affected area once 30 g 3  . lisinopril-hydrochlorothiazide (PRINZIDE,ZESTORETIC) 20-12.5 MG per tablet Take 1 tablet by mouth daily.     Marland Kitchen  LORazepam (ATIVAN) 0.5 MG tablet Take 1 tablet (0.5 mg total) by mouth every 6 (six) hours as needed (Nausea or vomiting). 30 tablet 0  . metFORMIN (GLUCOPHAGE-XR) 500 MG 24 hr tablet Take 500 mg by mouth daily with breakfast.     . Multiple Vitamins-Minerals (MULTIVITAMIN WITH MINERALS) tablet Take 1 tablet by mouth daily.    . ondansetron (ZOFRAN) 8 MG tablet Take 1 tablet (8 mg total) by mouth 2 (two) times daily as needed for refractory nausea / vomiting. Start on day 3 after chemo. 30 tablet 1  . ONETOUCH VERIO test strip     . oxyCODONE-acetaminophen (ROXICET) 5-325 MG tablet Take 1-2 tablets by mouth every 4 (four) hours as needed. 30 tablet 0  . potassium chloride (K-DUR) 10 MEQ tablet Take 10 mEq by mouth daily.    . prochlorperazine (COMPAZINE) 10 MG tablet Take 1 tablet (10 mg total) by mouth every 6 (six) hours as needed (Nausea or vomiting). 30 tablet 1  . Specialty Vitamins Products (MAGNESIUM, AMINO ACID CHELATE,) 133 MG tablet Take 1 tablet by mouth. '250mg'$     . traMADol (ULTRAM) 50 MG tablet Take 50 mg by mouth every 8 (eight) hours as needed.      No current facility-administered medications for this visit.   Facility-Administered Medications Ordered in Other Visits  Medication Dose Route Frequency Provider Last Rate Last Dose  . 0.9 %  sodium chloride infusion   Intravenous Once Nicholas Lose, MD      . cyclophosphamide (CYTOXAN) 1,260 mg in sodium chloride 0.9 % 250 mL chemo infusion  600 mg/m2 (Treatment Plan Actual) Intravenous Once Nicholas Lose, MD      . DOCEtaxel (TAXOTERE) 160 mg in dextrose 5 % 250 mL chemo infusion  75 mg/m2 (Treatment Plan Actual) Intravenous Once Nicholas Lose, MD      . fosaprepitant (EMEND) 150 mg, dexamethasone (DECADRON) 12 mg in sodium chloride 0.9 % 145 mL IVPB   Intravenous Once Nicholas Lose, MD      . heparin lock flush 100 unit/mL  500 Units Intracatheter Once PRN Nicholas Lose, MD      . palonosetron (ALOXI) injection 0.25 mg  0.25 mg  Intravenous Once Nicholas Lose, MD      . pegfilgrastim (NEULASTA ONPRO KIT) injection 6 mg  6 mg Subcutaneous Once Nicholas Lose, MD      . sodium chloride flush (NS) 0.9 % injection 10 mL  10 mL Intracatheter PRN Nicholas Lose, MD        PHYSICAL EXAMINATION: ECOG PERFORMANCE STATUS: 1 - Symptomatic but completely ambulatory  Filed Vitals:   11/25/15 0951  BP: 155/64  Pulse: 84  Temp: 98.2 F (36.8 C)  Resp: 18   Filed Weights   11/25/15 0951  Weight: 223 lb 8 oz (101.379 kg)    GENERAL:alert, no distress and comfortable SKIN: skin color, texture, turgor are normal, no rashes or significant lesions EYES: normal, Conjunctiva are pink and non-injected, sclera clear OROPHARYNX:no exudate, no erythema and lips, buccal mucosa, and tongue normal  NECK: supple, thyroid normal size, non-tender, without nodularity LYMPH:  no palpable lymphadenopathy in the cervical, axillary or inguinal LUNGS: clear to auscultation  and percussion with normal breathing effort HEART: regular rate & rhythm and no murmurs and no lower extremity edema ABDOMEN:abdomen soft, non-tender and normal bowel sounds MUSCULOSKELETAL:no cyanosis of digits and no clubbing  NEURO: alert & oriented x 3 with fluent speech, no focal motor/sensory deficits EXTREMITIES: No lower extremity edema  LABORATORY DATA:  I have reviewed the data as listed   Chemistry      Component Value Date/Time   NA 142 11/25/2015 0929   K 3.7 11/25/2015 0929   CO2 26 11/25/2015 0929   BUN 12.5 11/25/2015 0929   CREATININE 0.7 11/25/2015 0929      Component Value Date/Time   CALCIUM 9.5 11/25/2015 0929   ALKPHOS 101 11/25/2015 0929   AST 17 11/25/2015 0929   ALT 19 11/25/2015 0929   BILITOT 0.34 11/25/2015 0929       Lab Results  Component Value Date   WBC 8.7 11/25/2015   HGB 11.2* 11/25/2015   HCT 34.7* 11/25/2015   MCV 78.5* 11/25/2015   PLT 250 11/25/2015   NEUTROABS 5.3 11/25/2015     ASSESSMENT & PLAN:  Breast  cancer of upper-inner quadrant of right female breast ([place]) Right lumpectomy 10/30/2015: IDC grade 2, 1.4 cm, IG DCIS, margins negative, 0/2 lymph nodes negative, ER 100%, PR 0%, HER-2 negative, Ki-67 60 %, T1 cN0 stage IA Oncotype DX score 30, 20% risk of recurrence  Oncotype DX counseling: I discussed Oncotype DX score result and provided her with a copy of this report.  Treatment plan: 1. Adjuvant chemotherapy With Taxotere and Cytoxan 4 2. Followed by adjuvant radiation therapy 3. Followed by adjuvant antiestrogen therapy ------------------------------------------------------------------------------------------ Current Treatment: cycle 1 day 1 Taxotere Cytoxan Anti-emetics were reviewed Labs reviewed Monitoring closely for chemotherapy toxicity RTC 1 week for toxicity check.  No orders of the defined types were placed in this encounter.   The patient has a good understanding of the overall plan. she agrees with it. she will call with any problems that may develop before the next visit here.   Rulon Eisenmenger, MD 11/25/2015

## 2015-11-26 ENCOUNTER — Telehealth: Payer: Self-pay

## 2015-11-26 NOTE — Telephone Encounter (Signed)
Contacted pt re: 1st chemo treatment.  Pt denies nausea, vomiting, constipation, temps.  Reports mild diarrhea - resolved with immodium.  Reports she is eating and sleeping fine, drinking fluids.  I advised pt to stay hydrated, to contact clinic for any temps 100.5 or greater, and to contact clinic for any questions or issues.  Pt voiced understanding.

## 2015-11-27 ENCOUNTER — Telehealth: Payer: Self-pay | Admitting: *Deleted

## 2015-11-27 NOTE — Telephone Encounter (Signed)
-----   Message from Otila Kluver, RN sent at 11/25/2015  3:56 PM EDT ----- Regarding: Shelley Lawrence 1st time chemo Contact: [phone] 1st time Taxotere and Cytoxan. Also 1st time On Pro.  Tolerated chemo well. No evidence of reaction during treatment time.  Drucie Ip, RN

## 2015-11-27 NOTE — Telephone Encounter (Signed)
TC to patient for first time chemo follow up. No answer but was able to leave message on answering machine.

## 2015-12-02 ENCOUNTER — Ambulatory Visit (HOSPITAL_BASED_OUTPATIENT_CLINIC_OR_DEPARTMENT_OTHER): Payer: PPO | Admitting: Hematology and Oncology

## 2015-12-02 ENCOUNTER — Other Ambulatory Visit (HOSPITAL_BASED_OUTPATIENT_CLINIC_OR_DEPARTMENT_OTHER): Payer: PPO

## 2015-12-02 ENCOUNTER — Encounter: Payer: Self-pay | Admitting: Hematology and Oncology

## 2015-12-02 ENCOUNTER — Ambulatory Visit (HOSPITAL_BASED_OUTPATIENT_CLINIC_OR_DEPARTMENT_OTHER): Payer: PPO

## 2015-12-02 ENCOUNTER — Encounter: Payer: Self-pay | Admitting: *Deleted

## 2015-12-02 VITALS — BP 125/66 | HR 88 | Temp 98.6°F | Resp 18 | Wt 217.7 lb

## 2015-12-02 DIAGNOSIS — C50211 Malignant neoplasm of upper-inner quadrant of right female breast: Secondary | ICD-10-CM

## 2015-12-02 DIAGNOSIS — Z95828 Presence of other vascular implants and grafts: Secondary | ICD-10-CM

## 2015-12-02 LAB — CBC WITH DIFFERENTIAL/PLATELET
BASO%: 1.8 % (ref 0.0–2.0)
BASOS ABS: 0 10*3/uL (ref 0.0–0.1)
EOS%: 1.8 % (ref 0.0–7.0)
Eosinophils Absolute: 0 10*3/uL (ref 0.0–0.5)
HCT: 33.1 % — ABNORMAL LOW (ref 34.8–46.6)
HGB: 10.9 g/dL — ABNORMAL LOW (ref 11.6–15.9)
LYMPH%: 71.1 % — ABNORMAL HIGH (ref 14.0–49.7)
MCH: 25.2 pg (ref 25.1–34.0)
MCHC: 32.9 g/dL (ref 31.5–36.0)
MCV: 76.4 fL — AB (ref 79.5–101.0)
MONO#: 0.1 10*3/uL (ref 0.1–0.9)
MONO%: 7 % (ref 0.0–14.0)
NEUT#: 0.2 10*3/uL — CL (ref 1.5–6.5)
NEUT%: 18.3 % — ABNORMAL LOW (ref 38.4–76.8)
NRBC: 0 % (ref 0–0)
Platelets: 222 10*3/uL (ref 145–400)
RBC: 4.33 10*6/uL (ref 3.70–5.45)
RDW: 13.5 % (ref 11.2–14.5)
WBC: 1.1 10*3/uL — ABNORMAL LOW (ref 3.9–10.3)
lymph#: 0.8 10*3/uL — ABNORMAL LOW (ref 0.9–3.3)

## 2015-12-02 LAB — COMPREHENSIVE METABOLIC PANEL
ALT: 23 U/L (ref 0–55)
AST: 19 U/L (ref 5–34)
Albumin: 3.6 g/dL (ref 3.5–5.0)
Alkaline Phosphatase: 92 U/L (ref 40–150)
Anion Gap: 11 mEq/L (ref 3–11)
BUN: 10.2 mg/dL (ref 7.0–26.0)
CO2: 30 meq/L — AB (ref 22–29)
Calcium: 9.8 mg/dL (ref 8.4–10.4)
Chloride: 99 mEq/L (ref 98–109)
Creatinine: 0.7 mg/dL (ref 0.6–1.1)
GLUCOSE: 134 mg/dL (ref 70–140)
POTASSIUM: 3.9 meq/L (ref 3.5–5.1)
SODIUM: 140 meq/L (ref 136–145)
TOTAL PROTEIN: 6.8 g/dL (ref 6.4–8.3)
Total Bilirubin: 0.44 mg/dL (ref 0.20–1.20)

## 2015-12-02 MED ORDER — SODIUM CHLORIDE 0.9% FLUSH
10.0000 mL | INTRAVENOUS | Status: DC | PRN
  Administered 2015-12-02: 10 mL via INTRAVENOUS
  Filled 2015-12-02: qty 10

## 2015-12-02 MED ORDER — HEPARIN SOD (PORK) LOCK FLUSH 100 UNIT/ML IV SOLN
500.0000 [IU] | Freq: Once | INTRAVENOUS | Status: AC
  Administered 2015-12-02: 500 [IU] via INTRAVENOUS
  Filled 2015-12-02: qty 5

## 2015-12-02 NOTE — Progress Notes (Signed)
Patient Care Team: Jani Gravel, MD as PCP - General (Internal Medicine) Autumn Messing III, MD as Consulting Physician (General Surgery) Nicholas Lose, MD as Consulting Physician (Oncology) Gery Pray, MD as Consulting Physician (Radiation Oncology) Sylvan Cheese, NP as Nurse Practitioner (Hematology and Oncology)  DIAGNOSIS: Breast cancer of upper-inner quadrant of right female breast [facility])   Staging form: Breast, AJCC 7th Edition     Clinical stage from 10/23/2015: Stage IA (T1c, N0, M0) - Unsigned   SUMMARY OF ONCOLOGIC HISTORY:   Breast cancer of upper-inner quadrant of right female breast ([place])   10/14/2015 Initial Diagnosis Screening detected Right breast mass at 1:00:1.1 x 0.8 x 0.7 cm,grade 2 IDC plus DCIS, ER 100 present, PR 0%, HER-2 negative ratio 1.64, Ki-67 60%, T1cN0 stage IA   10/30/2015 Surgery Right lumpectomy: IDC grade 2, 1.4 cm, IG DCIS, margins negative, 0/2 lymph nodes negative, ER 100%, PR 0%, HER-2 negative, Ki-67 60 %, T1 cN0 stage IA, Oncotype DX score 30, 20% risk of recurrence   11/25/2015 -  Chemotherapy Adjuvant chemotherapy with Taxotere and Cytoxan every 3 weeks 4 cycles    CHIEF COMPLIANT: Cycle 1 day 8 Taxotere and Cytoxan  INTERVAL HISTORY: Shelley Lawrence is a 75 year old with above-mentioned history of right breast cancer treated with lumpectomy and is currently on adjuvant chemotherapy with Taxotere and Cytoxan. Today is cycle 1 day 8. She tolerated chemotherapy extremely well. She did have diarrhea for a couple of days after chemotherapy which has now subsided. Denies any nausea or vomiting. She does have a bad taste in the mouth. She does not feel fatigue other than some sleepiness related to nausea medication.  REVIEW OF SYSTEMS:   Constitutional: Denies fevers, chills or abnormal weight loss Eyes: Denies blurriness of vision Ears, nose, mouth, throat, and face: Denies mucositis or sore throat Respiratory: Denies cough, dyspnea or  wheezes Cardiovascular: Denies palpitation, chest discomfort Gastrointestinal:  Denies nausea, heartburn or change in bowel habits Skin: Denies abnormal skin rashes Lymphatics: Denies new lymphadenopathy or easy bruising Neurological:Denies numbness, tingling or new weaknesses Behavioral/Psych: Mood is stable, no new changes  Extremities: No lower extremity edema Breast:  denies any pain or lumps or nodules in either breasts All other systems were reviewed with the patient and are negative.  I have reviewed the past medical history, past surgical history, social history and family history with the patient and they are unchanged from previous note.  ALLERGIES:  is allergic to horse-derived products.  MEDICATIONS:  Current Outpatient Prescriptions  Medication Sig Dispense Refill  . aspirin 81 MG tablet Take 81 mg by mouth daily.    Marland Kitchen atorvastatin (LIPITOR) 40 MG tablet Take 40 mg by mouth daily.    Marland Kitchen b complex vitamins capsule Take 1 capsule by mouth daily.    . brimonidine-timolol (COMBIGAN) 0.2-0.5 % ophthalmic solution Place 1 drop into the left eye every 12 (twelve) hours.    . cycloSPORINE (RESTASIS) 0.05 % ophthalmic emulsion Place 1 drop into both eyes 2 (two) times daily.    Marland Kitchen esomeprazole (NEXIUM) 40 MG capsule Take 1 capsule (40 mg total) by mouth daily before breakfast. 30 capsule 1  . fexofenadine (ALLEGRA) 180 MG tablet Take 180 mg by mouth daily.    . fluticasone (VERAMYST) 27.5 MCG/SPRAY nasal spray Place 2 sprays into the nose daily.    Marland Kitchen HYDROcodone-acetaminophen (NORCO/VICODIN) 5-325 MG tablet Take 1-2 tablets by mouth every 4 (four) hours as needed for moderate pain or severe pain. 40 tablet  0  . ibuprofen (ADVIL,MOTRIN) 400 MG tablet Take 400 mg by mouth every 6 (six) hours as needed.    . latanoprost (XALATAN) 0.005 % ophthalmic solution Place 1 drop into both eyes at bedtime.    . lidocaine-prilocaine (EMLA) cream Apply to affected area once 30 g 3  .  lisinopril-hydrochlorothiazide (PRINZIDE,ZESTORETIC) 20-12.5 MG per tablet Take 1 tablet by mouth daily.     Marland Kitchen LORazepam (ATIVAN) 0.5 MG tablet Take 1 tablet (0.5 mg total) by mouth every 6 (six) hours as needed (Nausea or vomiting). 30 tablet 0  . metFORMIN (GLUCOPHAGE-XR) 500 MG 24 hr tablet Take 500 mg by mouth daily with breakfast.     . Multiple Vitamins-Minerals (MULTIVITAMIN WITH MINERALS) tablet Take 1 tablet by mouth daily.    . ondansetron (ZOFRAN) 8 MG tablet Take 1 tablet (8 mg total) by mouth 2 (two) times daily as needed for refractory nausea / vomiting. Start on day 3 after chemo. 30 tablet 1  . ONETOUCH VERIO test strip     . oxyCODONE-acetaminophen (ROXICET) 5-325 MG tablet Take 1-2 tablets by mouth every 4 (four) hours as needed. 30 tablet 0  . potassium chloride (K-DUR) 10 MEQ tablet Take 10 mEq by mouth daily.    . prochlorperazine (COMPAZINE) 10 MG tablet Take 1 tablet (10 mg total) by mouth every 6 (six) hours as needed (Nausea or vomiting). 30 tablet 1  . Specialty Vitamins Products (MAGNESIUM, AMINO ACID CHELATE,) 133 MG tablet Take 1 tablet by mouth. '250mg'$     . traMADol (ULTRAM) 50 MG tablet Take 50 mg by mouth every 8 (eight) hours as needed.      No current facility-administered medications for this visit.   Facility-Administered Medications Ordered in Other Visits  Medication Dose Route Frequency Provider Last Rate Last Dose  . sodium chloride flush (NS) 0.9 % injection 10 mL  10 mL Intravenous PRN Nicholas Lose, MD   10 mL at 12/02/15 0945    PHYSICAL EXAMINATION: ECOG PERFORMANCE STATUS: 1 - Symptomatic but completely ambulatory  Filed Vitals:   12/02/15 0944  BP: 125/66  Pulse: 88  Temp: 98.6 F (37 C)  Resp: 18   Filed Weights   12/02/15 0944  Weight: 217 lb 11.2 oz (98.748 kg)    GENERAL:alert, no distress and comfortable SKIN: skin color, texture, turgor are normal, no rashes or significant lesions EYES: normal, Conjunctiva are pink and  non-injected, sclera clear OROPHARYNX:no exudate, no erythema and lips, buccal mucosa, and tongue normal  NECK: supple, thyroid normal size, non-tender, without nodularity LYMPH:  no palpable lymphadenopathy in the cervical, axillary or inguinal LUNGS: clear to auscultation and percussion with normal breathing effort HEART: regular rate & rhythm and no murmurs and no lower extremity edema ABDOMEN:abdomen soft, non-tender and normal bowel sounds MUSCULOSKELETAL:no cyanosis of digits and no clubbing  NEURO: alert & oriented x 3 with fluent speech, no focal motor/sensory deficits EXTREMITIES: No lower extremity edema  LABORATORY DATA:  I have reviewed the data as listed   Chemistry      Component Value Date/Time   NA 142 11/25/2015 0929   K 3.7 11/25/2015 0929   CO2 26 11/25/2015 0929   BUN 12.5 11/25/2015 0929   CREATININE 0.7 11/25/2015 0929      Component Value Date/Time   CALCIUM 9.5 11/25/2015 0929   ALKPHOS 101 11/25/2015 0929   AST 17 11/25/2015 0929   ALT 19 11/25/2015 0929   BILITOT 0.34 11/25/2015 0929  Lab Results  Component Value Date   WBC 1.1* 12/02/2015   HGB 10.9* 12/02/2015   HCT 33.1* 12/02/2015   MCV 76.4* 12/02/2015   PLT 222 12/02/2015   NEUTROABS 0.2* 12/02/2015     ASSESSMENT & PLAN:  Breast cancer of upper-inner quadrant of right female breast ([place]) Right lumpectomy 10/30/2015: IDC grade 2, 1.4 cm, IG DCIS, margins negative, 0/2 lymph nodes negative, ER 100%, PR 0%, HER-2 negative, Ki-67 60 %, T1 cN0 stage IA Oncotype DX score 30, 20% risk of recurrence  Treatment plan: 1. Adjuvant chemotherapy With Taxotere and Cytoxan 4 started 11/25/2015 2. Followed by adjuvant radiation therapy 3. Followed by adjuvant antiestrogen therapy ------------------------------------------------------------------------------------------------------------------- Current Treatment: cycle 1 day 8 Taxotere Cytoxan Labs reviewed Chemotherapy toxicities: Denies  any nausea or vomiting. Complains of fatigue which is fairly mild. Diarrhea due to chemotherapy: Improved with Imodium. Sleepiness from medications Lab work showed [place] of 0.2. I will slightly decrease the dosage of Taxotere and Cytoxan.  Monitoring closely for chemotherapy toxicity RTC 2 week for cycle 2.  No orders of the defined types were placed in this encounter.   The patient has a good understanding of the overall plan. she agrees with it. she will call with any problems that may develop before the next visit here.   Rulon Eisenmenger, MD 12/02/2015

## 2015-12-02 NOTE — Patient Instructions (Signed)

## 2015-12-02 NOTE — Assessment & Plan Note (Signed)
Right lumpectomy 10/30/2015: IDC grade 2, 1.4 cm, IG DCIS, margins negative, 0/2 lymph nodes negative, ER 100%, PR 0%, HER-2 negative, Ki-67 60 %, T1 cN0 stage IA Oncotype DX score 30, 20% risk of recurrence  Treatment plan: 1. Adjuvant chemotherapy With Taxotere and Cytoxan 4 started 11/25/2015 2. Followed by adjuvant radiation therapy 3. Followed by adjuvant antiestrogen therapy ------------------------------------------------------------------------------------------------------------------- Current Treatment: cycle 1 day 8 Taxotere Cytoxan Labs reviewed Chemotherapy toxicities:  Monitoring closely for chemotherapy toxicity RTC 1 week for cycle 2.

## 2015-12-16 ENCOUNTER — Other Ambulatory Visit (HOSPITAL_BASED_OUTPATIENT_CLINIC_OR_DEPARTMENT_OTHER): Payer: PPO

## 2015-12-16 ENCOUNTER — Encounter: Payer: Self-pay | Admitting: Hematology and Oncology

## 2015-12-16 ENCOUNTER — Ambulatory Visit (HOSPITAL_BASED_OUTPATIENT_CLINIC_OR_DEPARTMENT_OTHER): Payer: PPO | Admitting: Hematology and Oncology

## 2015-12-16 ENCOUNTER — Ambulatory Visit (HOSPITAL_BASED_OUTPATIENT_CLINIC_OR_DEPARTMENT_OTHER): Payer: PPO

## 2015-12-16 ENCOUNTER — Ambulatory Visit: Payer: PPO

## 2015-12-16 VITALS — BP 156/70 | HR 77 | Temp 98.4°F | Resp 18 | Ht 61.0 in | Wt 227.4 lb

## 2015-12-16 DIAGNOSIS — Z5111 Encounter for antineoplastic chemotherapy: Secondary | ICD-10-CM | POA: Diagnosis not present

## 2015-12-16 DIAGNOSIS — C50211 Malignant neoplasm of upper-inner quadrant of right female breast: Secondary | ICD-10-CM | POA: Diagnosis not present

## 2015-12-16 DIAGNOSIS — Z5189 Encounter for other specified aftercare: Secondary | ICD-10-CM | POA: Diagnosis not present

## 2015-12-16 DIAGNOSIS — Z95828 Presence of other vascular implants and grafts: Secondary | ICD-10-CM

## 2015-12-16 LAB — CBC WITH DIFFERENTIAL/PLATELET
BASO%: 0.4 % (ref 0.0–2.0)
BASOS ABS: 0 10*3/uL (ref 0.0–0.1)
EOS ABS: 0.1 10*3/uL (ref 0.0–0.5)
EOS%: 0.8 % (ref 0.0–7.0)
HCT: 31.1 % — ABNORMAL LOW (ref 34.8–46.6)
HGB: 10.1 g/dL — ABNORMAL LOW (ref 11.6–15.9)
LYMPH%: 14.5 % (ref 14.0–49.7)
MCH: 25.2 pg (ref 25.1–34.0)
MCHC: 32.6 g/dL (ref 31.5–36.0)
MCV: 77.6 fL — ABNORMAL LOW (ref 79.5–101.0)
MONO#: 1.1 10*3/uL — AB (ref 0.1–0.9)
MONO%: 10.3 % (ref 0.0–14.0)
NEUT#: 7.6 10*3/uL — ABNORMAL HIGH (ref 1.5–6.5)
NEUT%: 74 % (ref 38.4–76.8)
PLATELETS: 278 10*3/uL (ref 145–400)
RBC: 4.01 10*6/uL (ref 3.70–5.45)
RDW: 14 % (ref 11.2–14.5)
WBC: 10.3 10*3/uL (ref 3.9–10.3)
lymph#: 1.5 10*3/uL (ref 0.9–3.3)

## 2015-12-16 LAB — COMPREHENSIVE METABOLIC PANEL
ALT: 24 U/L (ref 0–55)
ANION GAP: 7 meq/L (ref 3–11)
AST: 21 U/L (ref 5–34)
Albumin: 3.3 g/dL — ABNORMAL LOW (ref 3.5–5.0)
Alkaline Phosphatase: 100 U/L (ref 40–150)
BUN: 12.3 mg/dL (ref 7.0–26.0)
CHLORIDE: 107 meq/L (ref 98–109)
CO2: 27 meq/L (ref 22–29)
Calcium: 9.2 mg/dL (ref 8.4–10.4)
Creatinine: 0.6 mg/dL (ref 0.6–1.1)
Glucose: 119 mg/dl (ref 70–140)
Potassium: 3.9 mEq/L (ref 3.5–5.1)
Sodium: 142 mEq/L (ref 136–145)
Total Bilirubin: <0.3 mg/dL (ref 0.20–1.20)
Total Protein: 6.4 g/dL (ref 6.4–8.3)

## 2015-12-16 MED ORDER — HEPARIN SOD (PORK) LOCK FLUSH 100 UNIT/ML IV SOLN
500.0000 [IU] | Freq: Once | INTRAVENOUS | Status: AC | PRN
  Administered 2015-12-16: 500 [IU]
  Filled 2015-12-16: qty 5

## 2015-12-16 MED ORDER — PALONOSETRON HCL INJECTION 0.25 MG/5ML
0.2500 mg | Freq: Once | INTRAVENOUS | Status: AC
  Administered 2015-12-16: 0.25 mg via INTRAVENOUS

## 2015-12-16 MED ORDER — CYCLOPHOSPHAMIDE CHEMO INJECTION 1 GM
550.0000 mg/m2 | Freq: Once | INTRAMUSCULAR | Status: AC
  Administered 2015-12-16: 1140 mg via INTRAVENOUS
  Filled 2015-12-16: qty 57

## 2015-12-16 MED ORDER — SODIUM CHLORIDE 0.9% FLUSH
10.0000 mL | INTRAVENOUS | Status: DC | PRN
  Administered 2015-12-16: 10 mL
  Filled 2015-12-16: qty 10

## 2015-12-16 MED ORDER — SODIUM CHLORIDE 0.9% FLUSH
10.0000 mL | INTRAVENOUS | Status: DC | PRN
  Administered 2015-12-16: 10 mL via INTRAVENOUS
  Filled 2015-12-16: qty 10

## 2015-12-16 MED ORDER — PALONOSETRON HCL INJECTION 0.25 MG/5ML
INTRAVENOUS | Status: AC
  Filled 2015-12-16: qty 5

## 2015-12-16 MED ORDER — SODIUM CHLORIDE 0.9 % IV SOLN
70.0000 mg/m2 | Freq: Once | INTRAVENOUS | Status: AC
  Administered 2015-12-16: 150 mg via INTRAVENOUS
  Filled 2015-12-16: qty 15

## 2015-12-16 MED ORDER — SODIUM CHLORIDE 0.9 % IV SOLN
Freq: Once | INTRAVENOUS | Status: AC
  Administered 2015-12-16: 13:00:00 via INTRAVENOUS
  Filled 2015-12-16: qty 5

## 2015-12-16 MED ORDER — PEGFILGRASTIM 6 MG/0.6ML ~~LOC~~ PSKT
6.0000 mg | PREFILLED_SYRINGE | Freq: Once | SUBCUTANEOUS | Status: AC
  Administered 2015-12-16: 6 mg via SUBCUTANEOUS
  Filled 2015-12-16: qty 0.6

## 2015-12-16 MED ORDER — SODIUM CHLORIDE 0.9 % IV SOLN
Freq: Once | INTRAVENOUS | Status: AC
  Administered 2015-12-16: 12:00:00 via INTRAVENOUS

## 2015-12-16 NOTE — Progress Notes (Signed)
Patient Care Team: Jani Gravel, MD as PCP - General (Internal Medicine) Autumn Messing III, MD as Consulting Physician (General Surgery) Nicholas Lose, MD as Consulting Physician (Oncology) Gery Pray, MD as Consulting Physician (Radiation Oncology) Sylvan Cheese, NP as Nurse Practitioner (Hematology and Oncology)  DIAGNOSIS: Breast cancer of upper-inner quadrant of right female breast [facility])   Staging form: Breast, AJCC 7th Edition     Clinical stage from 10/23/2015: Stage IA (T1c, N0, M0) - Unsigned   SUMMARY OF ONCOLOGIC HISTORY:   Breast cancer of upper-inner quadrant of right female breast ([place])   10/14/2015 Initial Diagnosis Screening detected Right breast mass at 1:00:1.1 x 0.8 x 0.7 cm,grade 2 IDC plus DCIS, ER 100 present, PR 0%, HER-2 negative ratio 1.64, Ki-67 60%, T1cN0 stage IA   10/30/2015 Surgery Right lumpectomy: IDC grade 2, 1.4 cm, IG DCIS, margins negative, 0/2 lymph nodes negative, ER 100%, PR 0%, HER-2 negative, Ki-67 60 %, T1 cN0 stage IA, Oncotype DX score 30, 20% risk of recurrence   11/25/2015 -  Chemotherapy Adjuvant chemotherapy with Taxotere and Cytoxan every 3 weeks 4 cycles    CHIEF COMPLIANT: Cycle 2 day 1 Taxotere Cytoxan  INTERVAL HISTORY: Shelley Lawrence is a 75 year old with above-mentioned history of right breast cancer currently on adjuvant chemotherapy and today cycle 2 of Taxotere and Cytoxan. She had done fairly well from a treatment standpoint. She did not have any nausea or vomiting. She has noticed increasing hair loss. Her taste to certain foods is pretty bad. She complains of gaining weight and being hungry all the time. She is also having water retention issues with leg swelling for which she is not using compression stockings.  REVIEW OF SYSTEMS:   Constitutional: Denies fevers, chills or abnormal weight loss Eyes: Denies blurriness of vision Ears, nose, mouth, throat, and face: Denies mucositis or sore throat Respiratory: Denies cough,  dyspnea or wheezes Cardiovascular: Denies palpitation, chest discomfort Gastrointestinal:  Eating everything in sight Skin: Denies abnormal skin rashes Lymphatics: Denies new lymphadenopathy or easy bruising Neurological:Denies numbness, tingling or new weaknesses Behavioral/Psych: Mood is stable, no new changes  Extremities: lower extremity edema  All other systems were reviewed with the patient and are negative.  I have reviewed the past medical history, past surgical history, social history and family history with the patient and they are unchanged from previous note.  ALLERGIES:  is allergic to horse-derived products.  MEDICATIONS:  Current Outpatient Prescriptions  Medication Sig Dispense Refill  . aspirin 81 MG tablet Take 81 mg by mouth daily.    Marland Kitchen atorvastatin (LIPITOR) 40 MG tablet Take 40 mg by mouth daily.    Marland Kitchen b complex vitamins capsule Take 1 capsule by mouth daily.    . brimonidine-timolol (COMBIGAN) 0.2-0.5 % ophthalmic solution Place 1 drop into the left eye every 12 (twelve) hours.    . cycloSPORINE (RESTASIS) 0.05 % ophthalmic emulsion Place 1 drop into both eyes 2 (two) times daily.    Marland Kitchen esomeprazole (NEXIUM) 40 MG capsule Take 1 capsule (40 mg total) by mouth daily before breakfast. 30 capsule 1  . fexofenadine (ALLEGRA) 180 MG tablet Take 180 mg by mouth daily.    . fluticasone (VERAMYST) 27.5 MCG/SPRAY nasal spray Place 2 sprays into the nose daily.    Marland Kitchen HYDROcodone-acetaminophen (NORCO/VICODIN) 5-325 MG tablet Take 1-2 tablets by mouth every 4 (four) hours as needed for moderate pain or severe pain. 40 tablet 0  . ibuprofen (ADVIL,MOTRIN) 400 MG tablet Take 400 mg  by mouth every 6 (six) hours as needed.    . latanoprost (XALATAN) 0.005 % ophthalmic solution Place 1 drop into both eyes at bedtime.    . lidocaine-prilocaine (EMLA) cream Apply to affected area once 30 g 3  . lisinopril-hydrochlorothiazide (PRINZIDE,ZESTORETIC) 20-12.5 MG per tablet Take 1 tablet by  mouth daily.     Marland Kitchen LORazepam (ATIVAN) 0.5 MG tablet Take 1 tablet (0.5 mg total) by mouth every 6 (six) hours as needed (Nausea or vomiting). 30 tablet 0  . metFORMIN (GLUCOPHAGE-XR) 500 MG 24 hr tablet Take 500 mg by mouth daily with breakfast.     . Multiple Vitamins-Minerals (MULTIVITAMIN WITH MINERALS) tablet Take 1 tablet by mouth daily.    . ondansetron (ZOFRAN) 8 MG tablet Take 1 tablet (8 mg total) by mouth 2 (two) times daily as needed for refractory nausea / vomiting. Start on day 3 after chemo. 30 tablet 1  . ONETOUCH VERIO test strip     . oxyCODONE-acetaminophen (ROXICET) 5-325 MG tablet Take 1-2 tablets by mouth every 4 (four) hours as needed. 30 tablet 0  . potassium chloride (K-DUR) 10 MEQ tablet Take 10 mEq by mouth daily.    . prochlorperazine (COMPAZINE) 10 MG tablet Take 1 tablet (10 mg total) by mouth every 6 (six) hours as needed (Nausea or vomiting). 30 tablet 1  . Specialty Vitamins Products (MAGNESIUM, AMINO ACID CHELATE,) 133 MG tablet Take 1 tablet by mouth. '250mg'$     . traMADol (ULTRAM) 50 MG tablet Take 50 mg by mouth every 8 (eight) hours as needed.      No current facility-administered medications for this visit.    PHYSICAL EXAMINATION: ECOG PERFORMANCE STATUS: 1 - Symptomatic but completely ambulatory  Filed Vitals:   12/16/15 1116  BP: 156/70  Pulse: 77  Temp: 98.4 F (36.9 C)  Resp: 18   Filed Weights   12/16/15 1116  Weight: 227 lb 6.4 oz (103.148 kg)    GENERAL:alert, no distress and comfortable SKIN: skin color, texture, turgor are normal, no rashes or significant lesions EYES: normal, Conjunctiva are pink and non-injected, sclera clear OROPHARYNX:no exudate, no erythema and lips, buccal mucosa, and tongue normal  NECK: supple, thyroid normal size, non-tender, without nodularity LYMPH:  no palpable lymphadenopathy in the cervical, axillary or inguinal LUNGS: clear to auscultation and percussion with normal breathing effort HEART: regular rate  & rhythm and no murmurs and no lower extremity edema ABDOMEN:abdomen soft, non-tender and normal bowel sounds MUSCULOSKELETAL:no cyanosis of digits and no clubbing  NEURO: alert & oriented x 3 with fluent speech, no focal motor/sensory deficits EXTREMITIES:Bilateral last decompression stockings  LABORATORY DATA:  I have reviewed the data as listed   Chemistry      Component Value Date/Time   NA 140 12/02/2015 0930   K 3.9 12/02/2015 0930   CO2 30* 12/02/2015 0930   BUN 10.2 12/02/2015 0930   CREATININE 0.7 12/02/2015 0930      Component Value Date/Time   CALCIUM 9.8 12/02/2015 0930   ALKPHOS 92 12/02/2015 0930   AST 19 12/02/2015 0930   ALT 23 12/02/2015 0930   BILITOT 0.44 12/02/2015 0930       Lab Results  Component Value Date   WBC 1.1* 12/02/2015   HGB 10.9* 12/02/2015   HCT 33.1* 12/02/2015   MCV 76.4* 12/02/2015   PLT 222 12/02/2015   NEUTROABS 0.2* 12/02/2015   ASSESSMENT & PLAN:  Breast cancer of upper-inner quadrant of right female breast ([place]) Right lumpectomy 10/30/2015: IDC  grade 2, 1.4 cm, IG DCIS, margins negative, 0/2 lymph nodes negative, ER 100%, PR 0%, HER-2 negative, Ki-67 60 %, T1 cN0 stage IA Oncotype DX score 30, 20% risk of recurrence  Treatment plan: 1. Adjuvant chemotherapy With Taxotere and Cytoxan 4 started 11/25/2015 2. Followed by adjuvant radiation therapy 3. Followed by adjuvant antiestrogen therapy ------------------------------------------------------------------------------------------------------------------- Current Treatment: cycle 2 day 1 Taxotere Cytoxan Labs reviewed Chemotherapy toxicities: 1. Fatigue which is fairly mild. 2. Diarrhea due to chemotherapy: Improved with Imodium. 3. Sleepiness from medications 4. Neutropenia due to chemotherapy: Cycle 2 chemotherapy dose slightly reduced  Monitoring closely for chemotherapy toxicity RTC 3 week for cycle 3.  No orders of the defined types were placed in this encounter.     The patient has a good understanding of the overall plan. she agrees with it. she will call with any problems that may develop before the next visit here.   Rulon Eisenmenger, MD 12/16/2015

## 2015-12-16 NOTE — Assessment & Plan Note (Signed)
Right lumpectomy 10/30/2015: IDC grade 2, 1.4 cm, IG DCIS, margins negative, 0/2 lymph nodes negative, ER 100%, PR 0%, HER-2 negative, Ki-67 60 %, T1 cN0 stage IA Oncotype DX score 30, 20% risk of recurrence  Treatment plan: 1. Adjuvant chemotherapy With Taxotere and Cytoxan 4 started 11/25/2015 2. Followed by adjuvant radiation therapy 3. Followed by adjuvant antiestrogen therapy ------------------------------------------------------------------------------------------------------------------- Current Treatment: cycle 2 day 1 Taxotere Cytoxan Labs reviewed Chemotherapy toxicities: 1. Fatigue which is fairly mild. 2. Diarrhea due to chemotherapy: Improved with Imodium. 3. Sleepiness from medications 4. Neutropenia due to chemotherapy: Cycle 2 chemotherapy dose slightly reduced  Monitoring closely for chemotherapy toxicity RTC 3 week for cycle 3.

## 2015-12-16 NOTE — Patient Instructions (Signed)
[facility] Discharge Instructions for Patients Receiving Chemotherapy  Today you received the following chemotherapy agents:  Taxotere, Cytoxan and Neulasta  To help prevent nausea and vomiting after your treatment, we encourage you to take your nausea medication: for 3 days following chemo take Compazine for nausea instead of Zofran.   If you develop nausea and vomiting that is not controlled by your nausea medication, call the clinic.   BELOW ARE SYMPTOMS THAT SHOULD BE REPORTED IMMEDIATELY:  *FEVER GREATER THAN 100.5 F  *CHILLS WITH OR WITHOUT FEVER  NAUSEA AND VOMITING THAT IS NOT CONTROLLED WITH YOUR NAUSEA MEDICATION  *UNUSUAL SHORTNESS OF BREATH  *UNUSUAL BRUISING OR BLEEDING  TENDERNESS IN MOUTH AND THROAT WITH OR WITHOUT PRESENCE OF ULCERS  *URINARY PROBLEMS  *BOWEL PROBLEMS  UNUSUAL RASH Items with * indicate a potential emergency and should be followed up as soon as possible.  Feel free to call the clinic you have any questions or concerns. The clinic phone number is [phone].  Please show the [facility] at check-in to the Emergency Department and triage nurse.    Docetaxel injection What is this medicine? DOCETAXEL (doe se TAX el) is a chemotherapy drug. It targets fast dividing cells, like cancer cells, and causes these cells to die. This medicine is used to treat many types of cancers like breast cancer, certain stomach cancers, head and neck cancer, lung cancer, and prostate cancer. This medicine may be used for other purposes; ask your health care provider or pharmacist if you have questions. What should I tell my health care provider before I take this medicine? They need to know if you have any of these conditions: -infection (especially a virus infection such as chickenpox, cold sores, or herpes) -liver disease -low blood counts, like low white cell, platelet, or red cell counts -an unusual or allergic reaction to docetaxel,  polysorbate 80, other chemotherapy agents, other medicines, foods, dyes, or preservatives -pregnant or trying to get pregnant -breast-feeding How should I use this medicine? This drug is given as an infusion into a vein. It is administered in a hospital or clinic by a specially trained health care professional. Talk to your pediatrician regarding the use of this medicine in children. Special care may be needed. Overdosage: If you think you have taken too much of this medicine contact a poison control center or emergency room at once. NOTE: This medicine is only for you. Do not share this medicine with others. What if I miss a dose? It is important not to miss your dose. Call your doctor or health care professional if you are unable to keep an appointment. What may interact with this medicine? -cyclosporine -erythromycin -ketoconazole -medicines to increase blood counts like filgrastim, pegfilgrastim, sargramostim -vaccines Talk to your doctor or health care professional before taking any of these medicines: -acetaminophen -aspirin -ibuprofen -ketoprofen -naproxen This list may not describe all possible interactions. Give your health care provider a list of all the medicines, herbs, non-prescription drugs, or dietary supplements you use. Also tell them if you smoke, drink alcohol, or use illegal drugs. Some items may interact with your medicine. What should I watch for while using this medicine? Your condition will be monitored carefully while you are receiving this medicine. You will need important blood work done while you are taking this medicine. This drug may make you feel generally unwell. This is not uncommon, as chemotherapy can affect healthy cells as well as cancer cells. Report any side effects. Continue  your course of treatment even though you feel ill unless your doctor tells you to stop. In some cases, you may be given additional medicines to help with side effects. Follow all  directions for their use. Call your doctor or health care professional for advice if you get a fever, chills or sore throat, or other symptoms of a cold or flu. Do not treat yourself. This drug decreases your body's ability to fight infections. Try to avoid being around people who are sick. This medicine may increase your risk to bruise or bleed. Call your doctor or health care professional if you notice any unusual bleeding. This medicine may contain alcohol in the product. You may get drowsy or dizzy. Do not drive, use machinery, or do anything that needs mental alertness until you know how this medicine affects you. Do not stand or sit up quickly, especially if you are an older patient. This reduces the risk of dizzy or fainting spells. Avoid alcoholic drinks. Do not become pregnant while taking this medicine. Women should inform their doctor if they wish to become pregnant or think they might be pregnant. There is a potential for serious side effects to an unborn child. Talk to your health care professional or pharmacist for more information. Do not breast-feed an infant while taking this medicine. What side effects may I notice from receiving this medicine? Side effects that you should report to your doctor or health care professional as soon as possible: -allergic reactions like skin rash, itching or hives, swelling of the face, lips, or tongue -low blood counts - This drug may decrease the number of white blood cells, red blood cells and platelets. You may be at increased risk for infections and bleeding. -signs of infection - fever or chills, cough, sore throat, pain or difficulty passing urine -signs of decreased platelets or bleeding - bruising, pinpoint red spots on the skin, black, tarry stools, nosebleeds -signs of decreased red blood cells - unusually weak or tired, fainting spells, lightheadedness -breathing problems -fast or irregular heartbeat -low blood pressure -mouth sores -nausea  and vomiting -pain, swelling, redness or irritation at the injection site -pain, tingling, numbness in the hands or feet -swelling of the ankle, feet, hands -weight gain Side effects that usually do not require medical attention (report to your prescriber or health care professional if they continue or are bothersome): -bone pain -complete hair loss including hair on your head, underarms, pubic hair, eyebrows, and eyelashes -diarrhea -excessive tearing -changes in the color of fingernails -loosening of the fingernails -nausea -muscle pain -red flush to skin -sweating -weak or tired This list may not describe all possible side effects. Call your doctor for medical advice about side effects. You may report side effects to FDA at 1-800-FDA-1088. Where should I keep my medicine? This drug is given in a hospital or clinic and will not be stored at home. NOTE: This sheet is a summary. It may not cover all possible information. If you have questions about this medicine, talk to your doctor, pharmacist, or health care provider.    2016, [employer]. (2014-06-11 16:04:57) Cyclophosphamide injection What is this medicine? CYCLOPHOSPHAMIDE (sye kloe FOSS fa mide) is a chemotherapy drug. It slows the growth of cancer cells. This medicine is used to treat many types of cancer like lymphoma, myeloma, leukemia, breast cancer, and ovarian cancer, to name a few. This medicine may be used for other purposes; ask your health care provider or pharmacist if you have questions. What should I  tell my health care provider before I take this medicine? They need to know if you have any of these conditions: -blood disorders -history of other chemotherapy -infection -kidney disease -liver disease -recent or ongoing radiation therapy -tumors in the bone marrow -an unusual or allergic reaction to cyclophosphamide, other chemotherapy, other medicines, foods, dyes, or preservatives -pregnant or  trying to get pregnant -breast-feeding How should I use this medicine? This drug is usually given as an injection into a vein or muscle or by infusion into a vein. It is administered in a hospital or clinic by a specially trained health care professional. Talk to your pediatrician regarding the use of this medicine in children. Special care may be needed. Overdosage: If you think you have taken too much of this medicine contact a poison control center or emergency room at once. NOTE: This medicine is only for you. Do not share this medicine with others. What if I miss a dose? It is important not to miss your dose. Call your doctor or health care professional if you are unable to keep an appointment. What may interact with this medicine? This medicine may interact with the following medications: -amiodarone -amphotericin B -azathioprine -certain antiviral medicines for HIV or AIDS such as protease inhibitors (e.g., indinavir, ritonavir) and zidovudine -certain blood pressure medications such as benazepril, captopril, enalapril, fosinopril, lisinopril, moexipril, monopril, perindopril, quinapril, ramipril, trandolapril -certain cancer medications such as anthracyclines (e.g., daunorubicin, doxorubicin), busulfan, cytarabine, paclitaxel, pentostatin, tamoxifen, trastuzumab -certain diuretics such as chlorothiazide, chlorthalidone, hydrochlorothiazide, indapamide, metolazone -certain medicines that treat or prevent blood clots like warfarin -certain muscle relaxants such as succinylcholine -cyclosporine -etanercept -indomethacin -medicines to increase blood counts like filgrastim, pegfilgrastim, sargramostim -medicines used as general anesthesia -metronidazole -natalizumab This list may not describe all possible interactions. Give your health care provider a list of all the medicines, herbs, non-prescription drugs, or dietary supplements you use. Also tell them if you smoke, drink alcohol, or  use illegal drugs. Some items may interact with your medicine. What should I watch for while using this medicine? Visit your doctor for checks on your progress. This drug may make you feel generally unwell. This is not uncommon, as chemotherapy can affect healthy cells as well as cancer cells. Report any side effects. Continue your course of treatment even though you feel ill unless your doctor tells you to stop. Drink water or other fluids as directed. Urinate often, even at night. In some cases, you may be given additional medicines to help with side effects. Follow all directions for their use. Call your doctor or health care professional for advice if you get a fever, chills or sore throat, or other symptoms of a cold or flu. Do not treat yourself. This drug decreases your body's ability to fight infections. Try to avoid being around people who are sick. This medicine may increase your risk to bruise or bleed. Call your doctor or health care professional if you notice any unusual bleeding. Be careful brushing and flossing your teeth or using a toothpick because you may get an infection or bleed more easily. If you have any dental work done, tell your dentist you are receiving this medicine. You may get drowsy or dizzy. Do not drive, use machinery, or do anything that needs mental alertness until you know how this medicine affects you. Do not become pregnant while taking this medicine or for 1 year after stopping it. Women should inform their doctor if they wish to become pregnant or think they might  be pregnant. Men should not father a child while taking this medicine and for 4 months after stopping it. There is a potential for serious side effects to an unborn child. Talk to your health care professional or pharmacist for more information. Do not breast-feed an infant while taking this medicine. This medicine may interfere with the ability to have a child. This medicine has caused ovarian failure in  some women. This medicine has caused reduced sperm counts in some men. You should talk with your doctor or health care professional if you are concerned about your fertility. If you are going to have surgery, tell your doctor or health care professional that you have taken this medicine. What side effects may I notice from receiving this medicine? Side effects that you should report to your doctor or health care professional as soon as possible: -allergic reactions like skin rash, itching or hives, swelling of the face, lips, or tongue -low blood counts - this medicine may decrease the number of white blood cells, red blood cells and platelets. You may be at increased risk for infections and bleeding. -signs of infection - fever or chills, cough, sore throat, pain or difficulty passing urine -signs of decreased platelets or bleeding - bruising, pinpoint red spots on the skin, black, tarry stools, blood in the urine -signs of decreased red blood cells - unusually weak or tired, fainting spells, lightheadedness -breathing problems -dark urine -dizziness -palpitations -swelling of the ankles, feet, hands -trouble passing urine or change in the amount of urine -weight gain -yellowing of the eyes or skin Side effects that usually do not require medical attention (report to your doctor or health care professional if they continue or are bothersome): -changes in nail or skin color -hair loss -missed menstrual periods -mouth sores -nausea, vomiting This list may not describe all possible side effects. Call your doctor for medical advice about side effects. You may report side effects to FDA at 1-800-FDA-1088. Where should I keep my medicine? This drug is given in a hospital or clinic and will not be stored at home. NOTE: This sheet is a summary. It may not cover all possible information. If you have questions about this medicine, talk to your doctor, pharmacist, or health care provider.    2016,  [employer]. (2012-04-08 16:22:58)  Pegfilgrastim injection What is this medicine? PEGFILGRASTIM (PEG fil gra stim) is a long-acting granulocyte colony-stimulating factor that stimulates the growth of neutrophils, a type of white blood cell important in the body's fight against infection. It is used to reduce the incidence of fever and infection in patients with certain types of cancer who are receiving chemotherapy that affects the bone marrow, and to increase survival after being exposed to high doses of radiation. This medicine may be used for other purposes; ask your health care provider or pharmacist if you have questions. What should I tell my health care provider before I take this medicine? They need to know if you have any of these conditions: -kidney disease -latex allergy -ongoing radiation therapy -sickle cell disease -skin reactions to acrylic adhesives (On-Body Injector only) -an unusual or allergic reaction to pegfilgrastim, filgrastim, other medicines, foods, dyes, or preservatives -pregnant or trying to get pregnant -breast-feeding How should I use this medicine? This medicine is for injection under the skin. If you get this medicine at home, you will be taught how to prepare and give the pre-filled syringe or how to use the On-body Injector. Refer to the patient Instructions for Use  for detailed instructions. Use exactly as directed. Take your medicine at regular intervals. Do not take your medicine more often than directed. It is important that you put your used needles and syringes in a special sharps container. Do not put them in a trash can. If you do not have a sharps container, call your pharmacist or healthcare provider to get one. Talk to your pediatrician regarding the use of this medicine in children. While this drug may be prescribed for selected conditions, precautions do apply. Overdosage: If you think you have taken too much of this medicine contact a  poison control center or emergency room at once. NOTE: This medicine is only for you. Do not share this medicine with others. What if I miss a dose? It is important not to miss your dose. Call your doctor or health care professional if you miss your dose. If you miss a dose due to an On-body Injector failure or leakage, a new dose should be administered as soon as possible using a single prefilled syringe for manual use. What may interact with this medicine? Interactions have not been studied. Give your health care provider a list of all the medicines, herbs, non-prescription drugs, or dietary supplements you use. Also tell them if you smoke, drink alcohol, or use illegal drugs. Some items may interact with your medicine. This list may not describe all possible interactions. Give your health care provider a list of all the medicines, herbs, non-prescription drugs, or dietary supplements you use. Also tell them if you smoke, drink alcohol, or use illegal drugs. Some items may interact with your medicine. What should I watch for while using this medicine? You may need blood work done while you are taking this medicine. If you are going to need a MRI, CT scan, or other procedure, tell your doctor that you are using this medicine (On-Body Injector only). What side effects may I notice from receiving this medicine? Side effects that you should report to your doctor or health care professional as soon as possible: -allergic reactions like skin rash, itching or hives, swelling of the face, lips, or tongue -dizziness -fever -pain, redness, or irritation at site where injected -pinpoint red spots on the skin -red or dark-brown urine -shortness of breath or breathing problems -stomach or side pain, or pain at the shoulder -swelling -tiredness -trouble passing urine or change in the amount of urine Side effects that usually do not require medical attention (report to your doctor or health care  professional if they continue or are bothersome): -bone pain -muscle pain This list may not describe all possible side effects. Call your doctor for medical advice about side effects. You may report side effects to FDA at 1-800-FDA-1088. Where should I keep my medicine? Keep out of the reach of children. Store pre-filled syringes in a refrigerator between 2 and 8 degrees C (36 and 46 degrees F). Do not freeze. Keep in carton to protect from light. Throw away this medicine if it is left out of the refrigerator for more than 48 hours. Throw away any unused medicine after the expiration date. NOTE: This sheet is a summary. It may not cover all possible information. If you have questions about this medicine, talk to your doctor, pharmacist, or health care provider.    2016, [employer] Standard. (2014-06-14 14:30:14)

## 2016-01-06 ENCOUNTER — Ambulatory Visit: Payer: PPO

## 2016-01-06 ENCOUNTER — Encounter: Payer: Self-pay | Admitting: Hematology and Oncology

## 2016-01-06 ENCOUNTER — Other Ambulatory Visit (HOSPITAL_BASED_OUTPATIENT_CLINIC_OR_DEPARTMENT_OTHER): Payer: PPO

## 2016-01-06 ENCOUNTER — Ambulatory Visit (HOSPITAL_BASED_OUTPATIENT_CLINIC_OR_DEPARTMENT_OTHER): Payer: PPO | Admitting: Hematology and Oncology

## 2016-01-06 ENCOUNTER — Ambulatory Visit (HOSPITAL_BASED_OUTPATIENT_CLINIC_OR_DEPARTMENT_OTHER): Payer: PPO

## 2016-01-06 ENCOUNTER — Encounter: Payer: Self-pay | Admitting: *Deleted

## 2016-01-06 DIAGNOSIS — Z95828 Presence of other vascular implants and grafts: Secondary | ICD-10-CM | POA: Insufficient documentation

## 2016-01-06 DIAGNOSIS — C50211 Malignant neoplasm of upper-inner quadrant of right female breast: Secondary | ICD-10-CM | POA: Diagnosis not present

## 2016-01-06 DIAGNOSIS — D701 Agranulocytosis secondary to cancer chemotherapy: Secondary | ICD-10-CM | POA: Diagnosis not present

## 2016-01-06 DIAGNOSIS — Z5111 Encounter for antineoplastic chemotherapy: Secondary | ICD-10-CM | POA: Diagnosis not present

## 2016-01-06 DIAGNOSIS — Z5189 Encounter for other specified aftercare: Secondary | ICD-10-CM

## 2016-01-06 DIAGNOSIS — R5383 Other fatigue: Secondary | ICD-10-CM | POA: Diagnosis not present

## 2016-01-06 HISTORY — DX: Presence of other vascular implants and grafts: Z95.828

## 2016-01-06 LAB — COMPREHENSIVE METABOLIC PANEL
ALBUMIN: 3.3 g/dL — AB (ref 3.5–5.0)
ALK PHOS: 95 U/L (ref 40–150)
ALT: 19 U/L (ref 0–55)
AST: 19 U/L (ref 5–34)
Anion Gap: 8 mEq/L (ref 3–11)
BILIRUBIN TOTAL: 0.39 mg/dL (ref 0.20–1.20)
BUN: 14.1 mg/dL (ref 7.0–26.0)
CALCIUM: 9.2 mg/dL (ref 8.4–10.4)
CO2: 27 mEq/L (ref 22–29)
CREATININE: 0.6 mg/dL (ref 0.6–1.1)
Chloride: 106 mEq/L (ref 98–109)
EGFR: >90 mL/min/{1.73_m2} (ref >90)
GLUCOSE: 118 mg/dL (ref 70–140)
POTASSIUM: 4 meq/L (ref 3.5–5.1)
SODIUM: 141 meq/L (ref 136–145)
TOTAL PROTEIN: 6.1 g/dL — AB (ref 6.4–8.3)

## 2016-01-06 LAB — CBC WITH DIFFERENTIAL/PLATELET
BASO%: 1 % (ref 0.0–2.0)
BASOS ABS: 0.1 10*3/uL (ref 0.0–0.1)
EOS ABS: 0.1 10*3/uL (ref 0.0–0.5)
EOS%: 1.1 % (ref 0.0–7.0)
HEMATOCRIT: 31.1 % — AB (ref 34.8–46.6)
HEMOGLOBIN: 10.1 g/dL — AB (ref 11.6–15.9)
LYMPH#: 1.1 10*3/uL (ref 0.9–3.3)
LYMPH%: 13.8 % — ABNORMAL LOW (ref 14.0–49.7)
MCH: 25.1 pg (ref 25.1–34.0)
MCHC: 32.4 g/dL (ref 31.5–36.0)
MCV: 77.4 fL — ABNORMAL LOW (ref 79.5–101.0)
MONO#: 1.1 10*3/uL — ABNORMAL HIGH (ref 0.1–0.9)
MONO%: 13.3 % (ref 0.0–14.0)
NEUT%: 70.8 % (ref 38.4–76.8)
NEUTROS ABS: 5.8 10*3/uL (ref 1.5–6.5)
Platelets: 341 10*3/uL (ref 145–400)
RBC: 4.02 10*6/uL (ref 3.70–5.45)
RDW: 15.5 % — AB (ref 11.2–14.5)
WBC: 8.1 10*3/uL (ref 3.9–10.3)

## 2016-01-06 MED ORDER — PEGFILGRASTIM 6 MG/0.6ML ~~LOC~~ PSKT
6.0000 mg | PREFILLED_SYRINGE | Freq: Once | SUBCUTANEOUS | Status: AC
  Administered 2016-01-06: 6 mg via SUBCUTANEOUS
  Filled 2016-01-06: qty 0.6

## 2016-01-06 MED ORDER — HEPARIN SOD (PORK) LOCK FLUSH 100 UNIT/ML IV SOLN
500.0000 [IU] | Freq: Once | INTRAVENOUS | Status: AC | PRN
  Administered 2016-01-06: 500 [IU]
  Filled 2016-01-06: qty 5

## 2016-01-06 MED ORDER — PALONOSETRON HCL INJECTION 0.25 MG/5ML
INTRAVENOUS | Status: AC
  Filled 2016-01-06: qty 5

## 2016-01-06 MED ORDER — SODIUM CHLORIDE 0.9% FLUSH
10.0000 mL | INTRAVENOUS | Status: DC | PRN
  Administered 2016-01-06: 10 mL
  Filled 2016-01-06: qty 10

## 2016-01-06 MED ORDER — SODIUM CHLORIDE 0.9 % IV SOLN
70.0000 mg/m2 | Freq: Once | INTRAVENOUS | Status: AC
  Administered 2016-01-06: 150 mg via INTRAVENOUS
  Filled 2016-01-06: qty 15

## 2016-01-06 MED ORDER — CYCLOPHOSPHAMIDE CHEMO INJECTION 1 GM
550.0000 mg/m2 | Freq: Once | INTRAMUSCULAR | Status: AC
  Administered 2016-01-06: 1140 mg via INTRAVENOUS
  Filled 2016-01-06: qty 57

## 2016-01-06 MED ORDER — PALONOSETRON HCL INJECTION 0.25 MG/5ML
0.2500 mg | Freq: Once | INTRAVENOUS | Status: AC
  Administered 2016-01-06: 0.25 mg via INTRAVENOUS

## 2016-01-06 MED ORDER — SODIUM CHLORIDE 0.9 % IV SOLN
Freq: Once | INTRAVENOUS | Status: AC
  Administered 2016-01-06: 13:00:00 via INTRAVENOUS
  Filled 2016-01-06: qty 5

## 2016-01-06 MED ORDER — SODIUM CHLORIDE 0.9 % IJ SOLN
10.0000 mL | INTRAMUSCULAR | Status: DC | PRN
  Administered 2016-01-06: 10 mL via INTRAVENOUS
  Filled 2016-01-06: qty 10

## 2016-01-06 MED ORDER — SODIUM CHLORIDE 0.9 % IV SOLN
Freq: Once | INTRAVENOUS | Status: AC
  Administered 2016-01-06: 13:00:00 via INTRAVENOUS

## 2016-01-06 NOTE — Progress Notes (Signed)
Patient Care Team: Jani Gravel, MD as PCP - General (Internal Medicine) Autumn Messing III, MD as Consulting Physician (General Surgery) Nicholas Lose, MD as Consulting Physician (Oncology) Gery Pray, MD as Consulting Physician (Radiation Oncology) Sylvan Cheese, NP as Nurse Practitioner (Hematology and Oncology)  DIAGNOSIS: Breast cancer of upper-inner quadrant of right female breast [facility])   Staging form: Breast, AJCC 7th Edition   - Clinical stage from 10/23/2015: Stage IA (T1c, N0, M0) - Unsigned  SUMMARY OF ONCOLOGIC HISTORY:   Breast cancer of upper-inner quadrant of right female breast ([place])   10/14/2015 Initial Diagnosis    Screening detected Right breast mass at 1:00:1.1 x 0.8 x 0.7 cm,grade 2 IDC plus DCIS, ER 100 present, PR 0%, HER-2 negative ratio 1.64, Ki-67 60%, T1cN0 stage IA     10/30/2015 Surgery    Right lumpectomy: IDC grade 2, 1.4 cm, IG DCIS, margins negative, 0/2 lymph nodes negative, ER 100%, PR 0%, HER-2 negative, Ki-67 60 %, T1 cN0 stage IA, Oncotype DX score 30, 20% risk of recurrence     11/25/2015 -  Chemotherapy    Adjuvant chemotherapy with Taxotere and Cytoxan every 3 weeks 4 cycles      CHIEF COMPLIANT: Cycle 3 Taxotere and Cytoxan  INTERVAL HISTORY: Shelley Lawrence is a 75 year old with above-mentioned history of right breast cancer currently on adjuvant chemotherapy with Taxotere and Cytoxan. Today is cycle 3. She had done extremely well with cycle 2. We're slightly reduce the dosage of chemotherapy and she is tolerating it very well. She denies any nausea vomiting. Denies any neuropathy. She has very occasional loose stools but no diarrhea.  REVIEW OF SYSTEMS:   Constitutional: Denies fevers, chills or abnormal weight loss Eyes: Denies blurriness of vision Ears, nose, mouth, throat, and face: Denies mucositis or sore throat Respiratory: Denies cough, dyspnea or wheezes Cardiovascular: Denies palpitation, chest discomfort Gastrointestinal:   Loose stools Skin: Denies abnormal skin rashes Lymphatics: Denies new lymphadenopathy or easy bruising Neurological:Denies numbness, tingling or new weaknesses Behavioral/Psych: Mood is stable, no new changes  Extremities: No lower extremity edema All other systems were reviewed with the patient and are negative.  I have reviewed the past medical history, past surgical history, social history and family history with the patient and they are unchanged from previous note.  ALLERGIES:  is allergic to horse-derived products.  MEDICATIONS:  Current Outpatient Prescriptions  Medication Sig Dispense Refill  . aspirin 81 MG tablet Take 81 mg by mouth daily.    Marland Kitchen atorvastatin (LIPITOR) 40 MG tablet Take 40 mg by mouth daily.    Marland Kitchen b complex vitamins capsule Take 1 capsule by mouth daily.    . brimonidine-timolol (COMBIGAN) 0.2-0.5 % ophthalmic solution Place 1 drop into the left eye every 12 (twelve) hours.    . cycloSPORINE (RESTASIS) 0.05 % ophthalmic emulsion Place 1 drop into both eyes 2 (two) times daily.    Marland Kitchen esomeprazole (NEXIUM) 40 MG capsule Take 1 capsule (40 mg total) by mouth daily before breakfast. 30 capsule 1  . fexofenadine (ALLEGRA) 180 MG tablet Take 180 mg by mouth daily.    . fluticasone (VERAMYST) 27.5 MCG/SPRAY nasal spray Place 2 sprays into the nose daily.    Marland Kitchen HYDROcodone-acetaminophen (NORCO/VICODIN) 5-325 MG tablet Take 1-2 tablets by mouth every 4 (four) hours as needed for moderate pain or severe pain. 40 tablet 0  . ibuprofen (ADVIL,MOTRIN) 400 MG tablet Take 400 mg by mouth every 6 (six) hours as needed.    Marland Kitchen  latanoprost (XALATAN) 0.005 % ophthalmic solution Place 1 drop into both eyes at bedtime.    . lidocaine-prilocaine (EMLA) cream Apply to affected area once 30 g 3  . lisinopril-hydrochlorothiazide (PRINZIDE,ZESTORETIC) 20-12.5 MG per tablet Take 1 tablet by mouth daily.     Marland Kitchen LORazepam (ATIVAN) 0.5 MG tablet Take 1 tablet (0.5 mg total) by mouth every 6 (six)  hours as needed (Nausea or vomiting). 30 tablet 0  . metFORMIN (GLUCOPHAGE-XR) 500 MG 24 hr tablet Take 500 mg by mouth daily with breakfast.     . Multiple Vitamins-Minerals (MULTIVITAMIN WITH MINERALS) tablet Take 1 tablet by mouth daily.    . ondansetron (ZOFRAN) 8 MG tablet Take 1 tablet (8 mg total) by mouth 2 (two) times daily as needed for refractory nausea / vomiting. Start on day 3 after chemo. 30 tablet 1  . ONETOUCH VERIO test strip     . oxyCODONE-acetaminophen (ROXICET) 5-325 MG tablet Take 1-2 tablets by mouth every 4 (four) hours as needed. 30 tablet 0  . potassium chloride (K-DUR) 10 MEQ tablet Take 10 mEq by mouth daily.    . prochlorperazine (COMPAZINE) 10 MG tablet Take 1 tablet (10 mg total) by mouth every 6 (six) hours as needed (Nausea or vomiting). 30 tablet 1  . Specialty Vitamins Products (MAGNESIUM, AMINO ACID CHELATE,) 133 MG tablet Take 1 tablet by mouth. '250mg'$     . traMADol (ULTRAM) 50 MG tablet Take 50 mg by mouth every 8 (eight) hours as needed.      No current facility-administered medications for this visit.     PHYSICAL EXAMINATION: ECOG PERFORMANCE STATUS: 1 - Symptomatic but completely ambulatory  Vitals:   01/06/16 1136  BP: (!) 145/74  Pulse: 88  Resp: 18  Temp: 98.6 F (37 C)   Filed Weights   01/06/16 1136  Weight: 230 lb 14.4 oz (104.7 kg)    GENERAL:alert, no distress and comfortable SKIN: skin color, texture, turgor are normal, no rashes or significant lesions EYES: normal, Conjunctiva are pink and non-injected, sclera clear OROPHARYNX:no exudate, no erythema and lips, buccal mucosa, and tongue normal  NECK: supple, thyroid normal size, non-tender, without nodularity LYMPH:  no palpable lymphadenopathy in the cervical, axillary or inguinal LUNGS: clear to auscultation and percussion with normal breathing effort HEART: regular rate & rhythm and no murmurs and no lower extremity edema ABDOMEN:abdomen soft, non-tender and normal bowel  sounds MUSCULOSKELETAL:no cyanosis of digits and no clubbing  NEURO: alert & oriented x 3 with fluent speech, no focal motor/sensory deficits EXTREMITIES: No lower extremity edema  LABORATORY DATA:  I have reviewed the data as listed   Chemistry      Component Value Date/Time   NA 142 12/16/2015 1030   K 3.9 12/16/2015 1030   CO2 27 12/16/2015 1030   BUN 12.3 12/16/2015 1030   CREATININE 0.6 12/16/2015 1030      Component Value Date/Time   CALCIUM 9.2 12/16/2015 1030   ALKPHOS 100 12/16/2015 1030   AST 21 12/16/2015 1030   ALT 24 12/16/2015 1030   BILITOT <0.30 12/16/2015 1030       Lab Results  Component Value Date   WBC 8.1 01/06/2016   HGB 10.1 (L) 01/06/2016   HCT 31.1 (L) 01/06/2016   MCV 77.4 (L) 01/06/2016   PLT 341 01/06/2016   NEUTROABS 5.8 01/06/2016     ASSESSMENT & PLAN:  Breast cancer of upper-inner quadrant of right female breast ([place]) Right lumpectomy 10/30/2015: IDC grade 2, 1.4 cm, IG  DCIS, margins negative, 0/2 lymph nodes negative, ER 100%, PR 0%, HER-2 negative, Ki-67 60 %, T1 cN0 stage IA Oncotype DX score 30, 20% risk of recurrence  Treatment plan: 1. Adjuvant chemotherapy With Taxotere and Cytoxan 4 started 11/25/2015 2. Followed by adjuvant radiation therapy 3. Followed by adjuvant antiestrogen therapy ------------------------------------------------------------------------------------------------------------------- Current Treatment: cycle 3 day 1 Taxotere Cytoxan Labs reviewed  Chemotherapy toxicities: 1. Fatigue which is fairly mild. 2. Diarrhea due to chemotherapy: Improved with Imodium. 3. Sleepiness from medications 4. Neutropenia due to chemotherapy: Cycle 2 chemotherapy dose slightly reduced  Monitoring closely for chemotherapy toxicity RTC 3 week for cycle 4.   No orders of the defined types were placed in this encounter.  The patient has a good understanding of the overall plan. she agrees with it. she will call with  any problems that may develop before the next visit here.   Rulon Eisenmenger, MD 01/06/16

## 2016-01-06 NOTE — Patient Instructions (Signed)
[facility] Discharge Instructions for Patients Receiving Chemotherapy  Today you received the following chemotherapy agents:  Taxotere, Cytoxan  To help prevent nausea and vomiting after your treatment, we encourage you to take your nausea medication as prescribed.   If you develop nausea and vomiting that is not controlled by your nausea medication, call the clinic.   BELOW ARE SYMPTOMS THAT SHOULD BE REPORTED IMMEDIATELY:  *FEVER GREATER THAN 100.5 F  *CHILLS WITH OR WITHOUT FEVER  NAUSEA AND VOMITING THAT IS NOT CONTROLLED WITH YOUR NAUSEA MEDICATION  *UNUSUAL SHORTNESS OF BREATH  *UNUSUAL BRUISING OR BLEEDING  TENDERNESS IN MOUTH AND THROAT WITH OR WITHOUT PRESENCE OF ULCERS  *URINARY PROBLEMS  *BOWEL PROBLEMS  UNUSUAL RASH Items with * indicate a potential emergency and should be followed up as soon as possible.  Feel free to call the clinic you have any questions or concerns. The clinic phone number is [phone].  Please show the CHEMO ALERT CARD at check-in to the Emergency Department and triage nurse.   

## 2016-01-06 NOTE — Assessment & Plan Note (Signed)
Right lumpectomy 10/30/2015: IDC grade 2, 1.4 cm, IG DCIS, margins negative, 0/2 lymph nodes negative, ER 100%, PR 0%, HER-2 negative, Ki-67 60 %, T1 cN0 stage IA Oncotype DX score 30, 20% risk of recurrence  Treatment plan: 1. Adjuvant chemotherapy With Taxotere and Cytoxan 4 started 11/25/2015 2. Followed by adjuvant radiation therapy 3. Followed by adjuvant antiestrogen therapy ------------------------------------------------------------------------------------------------------------------- Current Treatment: cycle 3 day 1 Taxotere Cytoxan Labs reviewed  Chemotherapy toxicities: 1. Fatigue which is fairly mild. 2. Diarrhea due to chemotherapy: Improved with Imodium. 3. Sleepiness from medications 4. Neutropenia due to chemotherapy: Cycle 2 chemotherapy dose slightly reduced  Monitoring closely for chemotherapy toxicity RTC 3 week for cycle 4.

## 2016-01-13 ENCOUNTER — Telehealth: Payer: Self-pay

## 2016-01-13 MED ORDER — FUROSEMIDE 20 MG PO TABS: 20.0000 mg | ORAL_TABLET | Freq: Every day | ORAL | 0 refills | Status: DC

## 2016-01-13 NOTE — Telephone Encounter (Signed)
Received VM from pt who states she is concerned with bilateral lower extremity edema.  Called pt back to discuss further.  Pt reports experiencing significant edema with first chemotherapy treatment and subsequently with each dose; however, she feels with this past treatment her swelling has been a little more intense.  According to chart review, last dose of chemo given 01/06/16.  Pt reports edema was much worse on Thursday and Friday and swelling has improved since then.  Pt states she is elevating her feet and this seems to help but is curious as to what else she needs to do.  Reviewed information with Dr. Lindi Adie who recommends pt start lasix 20mg  daily PRN for edema.  Reviewed this medication and instructions for taking with pt.  Encouraged pt to notify us if she has any additional questions or concerns after starting medication.  Pt verbalized understanding and without further questions or concerns at time of call.

## 2016-01-23 ENCOUNTER — Other Ambulatory Visit: Payer: Self-pay

## 2016-01-23 ENCOUNTER — Telehealth: Payer: Self-pay

## 2016-01-23 DIAGNOSIS — C50211 Malignant neoplasm of upper-inner quadrant of right female breast: Secondary | ICD-10-CM

## 2016-01-23 DIAGNOSIS — M7989 Other specified soft tissue disorders: Secondary | ICD-10-CM

## 2016-01-23 MED ORDER — BUMETANIDE 1 MG PO TABS: 1.0000 mg | ORAL_TABLET | Freq: Every day | ORAL | 0 refills | Status: DC

## 2016-01-23 NOTE — Telephone Encounter (Signed)
Received VM from pt stating she had taken lasix as previously described for bilateral lower extremity edema but has not seen improvement.  Reviewed with Dr. Lindi Adie who ordered bilateral US to r/o DVT as well as Bumex 1mg  daily.  Called and discussed with pt.  Pt to discontinue lasix and start taking bumex.  Pt will be scheduled for doppler of both lower extremities.  Pt verbalized understanding of plan of care moving forward. No further questions or concerns at time of call.

## 2016-01-27 ENCOUNTER — Ambulatory Visit: Payer: PPO

## 2016-01-27 ENCOUNTER — Telehealth: Payer: Self-pay | Admitting: Hematology and Oncology

## 2016-01-27 ENCOUNTER — Ambulatory Visit (HOSPITAL_BASED_OUTPATIENT_CLINIC_OR_DEPARTMENT_OTHER): Payer: PPO

## 2016-01-27 ENCOUNTER — Other Ambulatory Visit (HOSPITAL_BASED_OUTPATIENT_CLINIC_OR_DEPARTMENT_OTHER): Payer: PPO

## 2016-01-27 ENCOUNTER — Ambulatory Visit (HOSPITAL_COMMUNITY)
Admission: RE | Admit: 2016-01-27 | Discharge: 2016-01-27 | Disposition: A | Discharge status: Home / Self Care | Payer: PPO | Source: Ambulatory Visit | Attending: Hematology and Oncology | Admitting: Hematology and Oncology

## 2016-01-27 ENCOUNTER — Encounter: Payer: Self-pay | Admitting: Hematology and Oncology

## 2016-01-27 ENCOUNTER — Ambulatory Visit (HOSPITAL_BASED_OUTPATIENT_CLINIC_OR_DEPARTMENT_OTHER): Payer: PPO | Admitting: Hematology and Oncology

## 2016-01-27 DIAGNOSIS — C50211 Malignant neoplasm of upper-inner quadrant of right female breast: Secondary | ICD-10-CM

## 2016-01-27 DIAGNOSIS — Z5189 Encounter for other specified aftercare: Secondary | ICD-10-CM

## 2016-01-27 DIAGNOSIS — D701 Agranulocytosis secondary to cancer chemotherapy: Secondary | ICD-10-CM | POA: Diagnosis not present

## 2016-01-27 DIAGNOSIS — Z95828 Presence of other vascular implants and grafts: Secondary | ICD-10-CM

## 2016-01-27 DIAGNOSIS — Z5111 Encounter for antineoplastic chemotherapy: Secondary | ICD-10-CM

## 2016-01-27 DIAGNOSIS — M7989 Other specified soft tissue disorders: Secondary | ICD-10-CM

## 2016-01-27 LAB — CBC WITH DIFFERENTIAL/PLATELET
BASO%: 1 % (ref 0.0–2.0)
Basophils Absolute: 0.1 10*3/uL (ref 0.0–0.1)
EOS%: 0.5 % (ref 0.0–7.0)
Eosinophils Absolute: 0.1 10*3/uL (ref 0.0–0.5)
HCT: 30 % — ABNORMAL LOW (ref 34.8–46.6)
HGB: 9.6 g/dL — ABNORMAL LOW (ref 11.6–15.9)
LYMPH%: 9.6 % — AB (ref 14.0–49.7)
MCH: 24.7 pg — ABNORMAL LOW (ref 25.1–34.0)
MCHC: 31.9 g/dL (ref 31.5–36.0)
MCV: 77.5 fL — ABNORMAL LOW (ref 79.5–101.0)
MONO#: 1.4 10*3/uL — AB (ref 0.1–0.9)
MONO%: 12.7 % (ref 0.0–14.0)
NEUT#: 8.6 10*3/uL — ABNORMAL HIGH (ref 1.5–6.5)
NEUT%: 76.2 % (ref 38.4–76.8)
PLATELETS: 279 10*3/uL (ref 145–400)
RBC: 3.87 10*6/uL (ref 3.70–5.45)
RDW: 17.4 % — ABNORMAL HIGH (ref 11.2–14.5)
WBC: 11.2 10*3/uL — ABNORMAL HIGH (ref 3.9–10.3)
lymph#: 1.1 10*3/uL (ref 0.9–3.3)

## 2016-01-27 LAB — COMPREHENSIVE METABOLIC PANEL
ALT: 16 U/L (ref 0–55)
ANION GAP: 9 meq/L (ref 3–11)
AST: 19 U/L (ref 5–34)
Albumin: 3.2 g/dL — ABNORMAL LOW (ref 3.5–5.0)
Alkaline Phosphatase: 87 U/L (ref 40–150)
BUN: 18.4 mg/dL (ref 7.0–26.0)
CHLORIDE: 103 meq/L (ref 98–109)
CO2: 29 meq/L (ref 22–29)
CREATININE: 0.7 mg/dL (ref 0.6–1.1)
Calcium: 9.2 mg/dL (ref 8.4–10.4)
Glucose: 128 mg/dl (ref 70–140)
POTASSIUM: 3.7 meq/L (ref 3.5–5.1)
Sodium: 141 mEq/L (ref 136–145)
Total Bilirubin: 0.31 mg/dL (ref 0.20–1.20)
Total Protein: 5.8 g/dL — ABNORMAL LOW (ref 6.4–8.3)

## 2016-01-27 MED ORDER — DOCETAXEL CHEMO INJECTION 160 MG/16ML
70.0000 mg/m2 | Freq: Once | INTRAVENOUS | Status: AC
  Administered 2016-01-27: 150 mg via INTRAVENOUS
  Filled 2016-01-27: qty 15

## 2016-01-27 MED ORDER — HEPARIN SOD (PORK) LOCK FLUSH 100 UNIT/ML IV SOLN
500.0000 [IU] | Freq: Once | INTRAVENOUS | Status: AC | PRN
  Administered 2016-01-27: 500 [IU]
  Filled 2016-01-27: qty 5

## 2016-01-27 MED ORDER — SODIUM CHLORIDE 0.9 % IJ SOLN
10.0000 mL | INTRAMUSCULAR | Status: DC | PRN
  Administered 2016-01-27: 10 mL via INTRAVENOUS
  Filled 2016-01-27: qty 10

## 2016-01-27 MED ORDER — SODIUM CHLORIDE 0.9 % IV SOLN
Freq: Once | INTRAVENOUS | Status: AC
  Administered 2016-01-27: 13:00:00 via INTRAVENOUS
  Filled 2016-01-27: qty 5

## 2016-01-27 MED ORDER — SODIUM CHLORIDE 0.9 % IV SOLN
Freq: Once | INTRAVENOUS | Status: AC
Start: 2016-01-27 — End: 2016-01-27
  Administered 2016-01-27: 13:00:00 via INTRAVENOUS

## 2016-01-27 MED ORDER — PALONOSETRON HCL INJECTION 0.25 MG/5ML
INTRAVENOUS | Status: AC
Start: 2016-01-27 — End: 2016-01-27
  Filled 2016-01-27: qty 5

## 2016-01-27 MED ORDER — PEGFILGRASTIM 6 MG/0.6ML ~~LOC~~ PSKT
6.0000 mg | PREFILLED_SYRINGE | Freq: Once | SUBCUTANEOUS | Status: AC
  Administered 2016-01-27: 6 mg via SUBCUTANEOUS
  Filled 2016-01-27: qty 0.6

## 2016-01-27 MED ORDER — SODIUM CHLORIDE 0.9% FLUSH
10.0000 mL | INTRAVENOUS | Status: DC | PRN
  Administered 2016-01-27: 10 mL
  Filled 2016-01-27: qty 10

## 2016-01-27 MED ORDER — CYCLOPHOSPHAMIDE CHEMO INJECTION 1 GM
550.0000 mg/m2 | Freq: Once | INTRAMUSCULAR | Status: AC
  Administered 2016-01-27: 1140 mg via INTRAVENOUS
  Filled 2016-01-27: qty 57

## 2016-01-27 MED ORDER — PALONOSETRON HCL INJECTION 0.25 MG/5ML
0.2500 mg | Freq: Once | INTRAVENOUS | Status: AC
  Administered 2016-01-27: 0.25 mg via INTRAVENOUS

## 2016-01-27 NOTE — Patient Instructions (Signed)

## 2016-01-27 NOTE — Telephone Encounter (Signed)
appt made and avs printed °

## 2016-01-27 NOTE — Patient Instructions (Signed)
[facility] Discharge Instructions for Patients Receiving Chemotherapy  Today you received the following chemotherapy agents:  Taxotere, Cytoxan  To help prevent nausea and vomiting after your treatment, we encourage you to take your nausea medication as prescribed.   If you develop nausea and vomiting that is not controlled by your nausea medication, call the clinic.   BELOW ARE SYMPTOMS THAT SHOULD BE REPORTED IMMEDIATELY:  *FEVER GREATER THAN 100.5 F  *CHILLS WITH OR WITHOUT FEVER  NAUSEA AND VOMITING THAT IS NOT CONTROLLED WITH YOUR NAUSEA MEDICATION  *UNUSUAL SHORTNESS OF BREATH  *UNUSUAL BRUISING OR BLEEDING  TENDERNESS IN MOUTH AND THROAT WITH OR WITHOUT PRESENCE OF ULCERS  *URINARY PROBLEMS  *BOWEL PROBLEMS  UNUSUAL RASH Items with * indicate a potential emergency and should be followed up as soon as possible.  Feel free to call the clinic you have any questions or concerns. The clinic phone number is [phone].  Please show the CHEMO ALERT CARD at check-in to the Emergency Department and triage nurse.   

## 2016-01-27 NOTE — Assessment & Plan Note (Signed)
Right lumpectomy 10/30/2015: IDC grade 2, 1.4 cm, IG DCIS, margins negative, 0/2 lymph nodes negative, ER 100%, PR 0%, HER-2 negative, Ki-67 60 %, T1 cN0 stage IA Oncotype DX score 30, 20% risk of recurrence  Treatment plan: 1. Adjuvant chemotherapy With Taxotere and Cytoxan 4 started 11/25/2015 completed 01/27/2016 2. Followed by adjuvant radiation therapy 3. Followed by adjuvant antiestrogen therapy ------------------------------------------------------------------------------------------------------------------- Current Treatment: cycle 3 day 1 Taxotere Cytoxan Labs reviewed  Chemotherapy toxicities: 1. Fatigue which is fairly mild. 2. Diarrhea due to chemotherapy: Improved with Imodium. 3. Sleepiness from medications 4. Neutropenia due to chemotherapy: Cycle 2 chemotherapy dose slightly reduced  Monitoring closely for chemotherapy toxicity Referral made radiation oncology to start adjuvant radiation  Return to clinic in 3 months to start antiestrogen therapy with anastrozole 1 mg by mouth daily.

## 2016-01-27 NOTE — Progress Notes (Signed)
Patient Care Team: Jani Gravel, MD as PCP - General (Internal Medicine) Autumn Messing III, MD as Consulting Physician (General Surgery) Nicholas Lose, MD as Consulting Physician (Oncology) Gery Pray, MD as Consulting Physician (Radiation Oncology) Sylvan Cheese, NP as Nurse Practitioner (Hematology and Oncology)  DIAGNOSIS: Breast cancer of upper-inner quadrant of right female breast [facility])   Staging form: Breast, AJCC 7th Edition   - Clinical stage from 10/23/2015: Stage IA (T1c, N0, M0) - Unsigned  SUMMARY OF ONCOLOGIC HISTORY:   Breast cancer of upper-inner quadrant of right female breast ([place])   10/14/2015 Initial Diagnosis    Screening detected Right breast mass at 1:00:1.1 x 0.8 x 0.7 cm,grade 2 IDC plus DCIS, ER 100 present, PR 0%, HER-2 negative ratio 1.64, Ki-67 60%, T1cN0 stage IA      10/30/2015 Surgery    Right lumpectomy: IDC grade 2, 1.4 cm, IG DCIS, margins negative, 0/2 lymph nodes negative, ER 100%, PR 0%, HER-2 negative, Ki-67 60 %, T1 cN0 stage IA, Oncotype DX score 30, 20% risk of recurrence      11/25/2015 -  Chemotherapy    Adjuvant chemotherapy with Taxotere and Cytoxan every 3 weeks 4 cycles       CHIEF COMPLIANT: Cycle 3 Taxotere and Cytoxan  INTERVAL HISTORY: Shelley Lawrence is a 75 year old with above-mentioned history of right breast cancer currently on adjuvant chemotherapy and today's cycle 3 of Taxotere and Cytoxan. She has had problems with leg swelling for which we tried Lasix which did not help. She is currently on Bumex which appears to be helping her mildly. Very uncomfortable because it is difficult to move her ankles and knee because of the tightness sensation. She denies any neuropathy. Denies any tingling numbness or burning sensation in her hands or feet. She has noticed some nail discoloration. Today is her last cycle of chemotherapy. She is happy about that.  REVIEW OF SYSTEMS:   Constitutional: Denies fevers, chills or abnormal weight  loss Eyes: Denies blurriness of vision Ears, nose, mouth, throat, and face: Denies mucositis or sore throat Respiratory: Denies cough, dyspnea or wheezes Cardiovascular: Denies palpitation, chest discomfort Gastrointestinal:  Denies nausea, heartburn or change in bowel habits Skin: Denies abnormal skin rashes Lymphatics: Denies new lymphadenopathy or easy bruising Neurological:Denies numbness, tingling or new weaknesses Behavioral/Psych: Mood is stable, no new changes  Extremities: 2-3+ lower extremity edema  All other systems were reviewed with the patient and are negative.  I have reviewed the past medical history, past surgical history, social history and family history with the patient and they are unchanged from previous note.  ALLERGIES:  is allergic to horse-derived products.  MEDICATIONS:  Current Outpatient Prescriptions  Medication Sig Dispense Refill  . aspirin 81 MG tablet Take 81 mg by mouth daily.    Marland Kitchen atorvastatin (LIPITOR) 40 MG tablet Take 40 mg by mouth daily.    Marland Kitchen b complex vitamins capsule Take 1 capsule by mouth daily.    . brimonidine-timolol (COMBIGAN) 0.2-0.5 % ophthalmic solution Place 1 drop into the left eye every 12 (twelve) hours.    . bumetanide (BUMEX) 1 MG tablet Take 1 tablet (1 mg total) by mouth daily. 30 tablet 0  . cycloSPORINE (RESTASIS) 0.05 % ophthalmic emulsion Place 1 drop into both eyes 2 (two) times daily.    Marland Kitchen esomeprazole (NEXIUM) 40 MG capsule Take 1 capsule (40 mg total) by mouth daily before breakfast. 30 capsule 1  . fexofenadine (ALLEGRA) 180 MG tablet Take 180 mg by  mouth daily.    . fluticasone (VERAMYST) 27.5 MCG/SPRAY nasal spray Place 2 sprays into the nose daily.    Marland Kitchen HYDROcodone-acetaminophen (NORCO/VICODIN) 5-325 MG tablet Take 1-2 tablets by mouth every 4 (four) hours as needed for moderate pain or severe pain. 40 tablet 0  . ibuprofen (ADVIL,MOTRIN) 400 MG tablet Take 400 mg by mouth every 6 (six) hours as needed.    .  latanoprost (XALATAN) 0.005 % ophthalmic solution Place 1 drop into both eyes at bedtime.    . lidocaine-prilocaine (EMLA) cream Apply to affected area once 30 g 3  . lisinopril-hydrochlorothiazide (PRINZIDE,ZESTORETIC) 20-12.5 MG per tablet Take 1 tablet by mouth daily.     Marland Kitchen LORazepam (ATIVAN) 0.5 MG tablet Take 1 tablet (0.5 mg total) by mouth every 6 (six) hours as needed (Nausea or vomiting). 30 tablet 0  . metFORMIN (GLUCOPHAGE-XR) 500 MG 24 hr tablet Take 500 mg by mouth daily with breakfast.     . Multiple Vitamins-Minerals (MULTIVITAMIN WITH MINERALS) tablet Take 1 tablet by mouth daily.    . ondansetron (ZOFRAN) 8 MG tablet Take 1 tablet (8 mg total) by mouth 2 (two) times daily as needed for refractory nausea / vomiting. Start on day 3 after chemo. 30 tablet 1  . ONETOUCH VERIO test strip     . oxyCODONE-acetaminophen (ROXICET) 5-325 MG tablet Take 1-2 tablets by mouth every 4 (four) hours as needed. 30 tablet 0  . potassium chloride (K-DUR) 10 MEQ tablet Take 10 mEq by mouth daily.    . prochlorperazine (COMPAZINE) 10 MG tablet Take 1 tablet (10 mg total) by mouth every 6 (six) hours as needed (Nausea or vomiting). 30 tablet 1  . Specialty Vitamins Products (MAGNESIUM, AMINO ACID CHELATE,) 133 MG tablet Take 1 tablet by mouth. '250mg'$     . traMADol (ULTRAM) 50 MG tablet Take 50 mg by mouth every 8 (eight) hours as needed.      No current facility-administered medications for this visit.     PHYSICAL EXAMINATION: ECOG PERFORMANCE STATUS: 1 - Symptomatic but completely ambulatory  Vitals:   01/27/16 1155  BP: 134/65  Pulse: 98  Resp: 18  Temp: 98.3 F (36.8 C)   Filed Weights   01/27/16 1155  Weight: 236 lb 1.6 oz (107.1 kg)    GENERAL:alert, no distress and comfortable SKIN: skin color, texture, turgor are normal, no rashes or significant lesions EYES: normal, Conjunctiva are pink and non-injected, sclera clear OROPHARYNX:no exudate, no erythema and lips, buccal mucosa,  and tongue normal  NECK: supple, thyroid normal size, non-tender, without nodularity LYMPH:  no palpable lymphadenopathy in the cervical, axillary or inguinal LUNGS: clear to auscultation and percussion with normal breathing effort HEART: regular rate & rhythm and no murmurs and no lower extremity edema ABDOMEN:abdomen soft, non-tender and normal bowel sounds MUSCULOSKELETAL:no cyanosis of digits and no clubbing  NEURO: alert & oriented x 3 with fluent speech, no focal motor/sensory deficits EXTREMITIES:2-3+ lower extremity edema  LABORATORY DATA:  I have reviewed the data as listed   Chemistry      Component Value Date/Time   NA 141 01/06/2016 1056   K 4.0 01/06/2016 1056   CO2 27 01/06/2016 1056   BUN 14.1 01/06/2016 1056   CREATININE 0.6 01/06/2016 1056      Component Value Date/Time   CALCIUM 9.2 01/06/2016 1056   ALKPHOS 95 01/06/2016 1056   AST 19 01/06/2016 1056   ALT 19 01/06/2016 1056   BILITOT 0.39 01/06/2016 1056  Lab Results  Component Value Date   WBC 11.2 (H) 01/27/2016   HGB 9.6 (L) 01/27/2016   HCT 30.0 (L) 01/27/2016   MCV 77.5 (L) 01/27/2016   PLT 279 01/27/2016   NEUTROABS 8.6 (H) 01/27/2016     ASSESSMENT & PLAN:  Breast cancer of upper-inner quadrant of right female breast ([place]) Right lumpectomy 10/30/2015: IDC grade 2, 1.4 cm, IG DCIS, margins negative, 0/2 lymph nodes negative, ER 100%, PR 0%, HER-2 negative, Ki-67 60 %, T1 cN0 stage IA Oncotype DX score 30, 20% risk of recurrence  Treatment plan: 1. Adjuvant chemotherapy With Taxotere and Cytoxan 4 started 11/25/2015 completed 01/27/2016 2. Followed by adjuvant radiation therapy 3. Followed by adjuvant antiestrogen therapy ------------------------------------------------------------------------------------------------------------------- Current Treatment: cycle 3 day 1 Taxotere Cytoxan Labs reviewed  Chemotherapy toxicities: 1. Fatigue which is fairly mild. 2. Diarrhea due to  chemotherapy: Improved with Imodium. 3. Sleepiness from medications 4. Neutropenia due to chemotherapy: Cycle 2 chemotherapy dose slightly reduced 5. Leg edema: Currently on Bumex 1 mg daily. I encouraged her take one half tablets and given 2 tablets as needed. She is taking over-the-counter potassium supplement.  Monitoring closely for chemotherapy toxicity Referral made to radiation oncology to start adjuvant radiation  Return to clinic in 3 months to start antiestrogen therapy with anastrozole 1 mg by mouth daily.   Orders Placed This Encounter  Procedures  . CBC with Differential    Standing Status:   Future    Standing Expiration Date:   01/26/2017  . Comprehensive metabolic panel    Standing Status:   Future    Standing Expiration Date:   01/26/2017   The patient has a good understanding of the overall plan. she agrees with it. she will call with any problems that may develop before the next visit here.   Rulon Eisenmenger, MD 01/27/16

## 2016-01-27 NOTE — Progress Notes (Signed)
VASCULAR LAB PRELIMINARY  PRELIMINARY  PRELIMINARY  PRELIMINARY  Bilateral lower extremity venous duplex completed.    Preliminary report:  Bilateral:  No evidence of DVT, superficial thrombosis, or Baker's Cyst.   Onell Mcmath, RVS 01/27/2016, 10:16 AM

## 2016-01-31 ENCOUNTER — Telehealth: Payer: Self-pay

## 2016-01-31 NOTE — Telephone Encounter (Signed)
Returned call to pt re: hip and thigh pain.  I advised pt that pain is likely neulasta given with last treatment.  Pt confirms she is taking claritin.  I advised pt she can also take Tylenol or her pain medications if the pain becomes bad.   Pt also reports her appt with Dr. Sondra Come is 9/27.  I let pt know we would not need to see her again until radiation is complete.  Pt voiced understanding.

## 2016-02-03 ENCOUNTER — Telehealth: Payer: Self-pay | Admitting: *Deleted

## 2016-02-03 NOTE — Telephone Encounter (Signed)
  Oncology Nurse Navigator Documentation  Navigator Location: [facility]-Med Onc (02/03/16 1200) Navigator Encounter Type: Treatment (02/03/16 1200)           Patient Visit Type: MedOnc (02/03/16 1200) Treatment Phase: Final Chemo TX (02/03/16 1200)                            Time Spent with Patient: 15 (02/03/16 1200)

## 2016-02-08 ENCOUNTER — Other Ambulatory Visit: Payer: Self-pay | Admitting: Hematology and Oncology

## 2016-02-08 DIAGNOSIS — C50211 Malignant neoplasm of upper-inner quadrant of right female breast: Secondary | ICD-10-CM

## 2016-02-09 ENCOUNTER — Other Ambulatory Visit: Payer: Self-pay | Admitting: Hematology and Oncology

## 2016-02-12 ENCOUNTER — Other Ambulatory Visit: Payer: Self-pay

## 2016-02-12 DIAGNOSIS — C50211 Malignant neoplasm of upper-inner quadrant of right female breast: Secondary | ICD-10-CM

## 2016-02-12 MED ORDER — LORAZEPAM 0.5 MG PO TABS: 0.5000 mg | ORAL_TABLET | Freq: Four times a day (QID) | ORAL | 0 refills | Status: DC | PRN

## 2016-02-13 ENCOUNTER — Telehealth: Payer: Self-pay | Admitting: *Deleted

## 2016-02-13 ENCOUNTER — Other Ambulatory Visit: Payer: Self-pay | Admitting: *Deleted

## 2016-02-13 DIAGNOSIS — C50211 Malignant neoplasm of upper-inner quadrant of right female breast: Secondary | ICD-10-CM

## 2016-02-13 DIAGNOSIS — M25552 Pain in left hip: Secondary | ICD-10-CM

## 2016-02-13 DIAGNOSIS — M25551 Pain in right hip: Secondary | ICD-10-CM

## 2016-02-13 MED ORDER — HYDROCODONE-ACETAMINOPHEN 5-325 MG PO TABS: 1.0000 | ORAL_TABLET | ORAL | 0 refills | Status: DC | PRN

## 2016-02-13 MED ORDER — HYDROCODONE-ACETAMINOPHEN 5-325 MG PO TABS: 1.0000 | ORAL_TABLET | Freq: Four times a day (QID) | ORAL | 0 refills | Status: DC | PRN

## 2016-02-13 NOTE — Telephone Encounter (Signed)
Received call from patient stating that both of her hips have been hurting and she has to stand at a church function this weekend and is requesting some pain medication.  Informed her that we should find out why she is having this pain.  Per Dr. Lindi Adie we can give her some Norco and for her to have xrays of both of her hips.  She states she will come in Monday for xrays. Informed her the rx will be at the front for her to pick up.

## 2016-02-14 NOTE — Progress Notes (Signed)
Called in prescription to [facility] for Ativan as requested by pt.

## 2016-02-28 ENCOUNTER — Encounter: Payer: Self-pay | Admitting: Radiation Oncology

## 2016-02-28 NOTE — Progress Notes (Signed)
Location of Breast Cancer: Breast cancer of upper-inner quadrant of right female breast    Histology per Pathology Report:   10/30/15 Diagnosis 1. Lymph node, sentinel, biopsy, Right axillary #1 - ONE OF ONE LYMPH NODES NEGATIVE FOR CARCINOMA (0/1). 2. Lymph node, sentinel, biopsy, Right - ONE OF ONE LYMPH NODES NEGATIVE FOR CARCINOMA (0/1). 3. Breast, lumpectomy, Right - INVASIVE CARCINOMA, GRADE 2, SPANNING 1.4 CM. - DUCTAL CARCINOMA IN SITU, INTERMEDIATE GRADE. - DUCTAL CARCINOMA IN SITU COMES TO WITHIN 0.1-0.2 CM OF THE LATERAL MARGIN FOCALLY. - RESECTION MARGINS ARE NEGATIVE FOR INVASIVE CARCINOMA. - BIOPSY SITE. - SEE ONCOLOGY TABLE  10/14/15 Diagnosis Breast, right, needle core biopsy, 1:00 o'clock - INVASIVE DUCTAL CARCINOMA. - DUCTAL CARCINOMA IN SITU. - SEE COMMENT.  Receptor Status: ER(100%), PR (0%), Her2-neu (negative), Ki-(60%)  Did patient present with symptoms (if so, please note symptoms) or was this found on screening mammography?: screening mammogram  Past/Anticipated interventions by surgeon, if any: 10/30/15 - Procedure: BREAST LUMPECTOMY WITH NEEDLE LOCALIZATION AND AXILLARY SENTINEL LYMPH NODE BX;  Surgeon: Autumn Messing III, MD   Past/Anticipated interventions by medical oncology, if any: Adjuvant chemotherapy with Taxotere and Cytoxan every 3 weeks 4 cycles   Lymphedema issues, if any:  no  Pain issues, if any: hips, back of thighs from chemotherapy - takes norco as needed  OB Gyn history: She menarched at age of 67 and went to menopause at age 56  She had no pregnancies. She used birth control pills for many years. She was exposed to fertility medications or hormone replacement therapy for 20 years.  She has no family history of Breast/GYN/GI cancer  SAFETY ISSUES:  Prior radiation? no  Pacemaker/ICD? no  Possible current pregnancy?no  Is the patient on methotrexate? no  Current Complaints / other details:    BP (!) 141/66 (BP Location: Left  Wrist, Patient Position: Sitting)   Pulse 87   Temp 98.1 F (36.7 C) (Oral)   Ht _0  (1.549 m)   Wt 236 lb 14.4 oz (107.5 kg)   SpO2 98%   BMI 44.76 kg/m    Wt Readings from Last 3 Encounters:  03/04/16 236 lb 14.4 oz (107.5 kg)  01/27/16 236 lb 1.6 oz (107.1 kg)  01/06/16 230 lb 14.4 oz (104.7 kg)    Hess, Craige Cotta, RN 02/28/2016,10:41 AM

## 2016-03-04 ENCOUNTER — Ambulatory Visit
Admission: RE | Admit: 2016-03-04 | Discharge: 2016-03-04 | Disposition: A | Discharge status: Home / Self Care | Payer: PPO | Source: Ambulatory Visit | Attending: Radiation Oncology | Admitting: Radiation Oncology

## 2016-03-04 ENCOUNTER — Encounter: Payer: Self-pay | Admitting: Radiation Oncology

## 2016-03-04 DIAGNOSIS — Z510 Encounter for antineoplastic radiation therapy: Secondary | ICD-10-CM | POA: Insufficient documentation

## 2016-03-04 DIAGNOSIS — Z170 Estrogen receptor positive status [ER+]: Secondary | ICD-10-CM | POA: Diagnosis not present

## 2016-03-04 DIAGNOSIS — C50211 Malignant neoplasm of upper-inner quadrant of right female breast: Secondary | ICD-10-CM | POA: Diagnosis not present

## 2016-03-04 DIAGNOSIS — Z9221 Personal history of antineoplastic chemotherapy: Secondary | ICD-10-CM | POA: Diagnosis not present

## 2016-03-04 NOTE — Progress Notes (Signed)
Please see the Nurse Progress Note in the MD Initial Consult Encounter for this patient. 

## 2016-03-04 NOTE — Progress Notes (Signed)
Radiation Oncology         [phone] ________________________________  Name: Shelley Lawrence MRN: [id number]  Date: 03/04/2016  DOB: 12-Oct-1940  Re-Evaluation Visit Note  CC: Jani Gravel, MD  Nicholas Lose, MD    ICD-9-CM ICD-10-CM   1. 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 The patient presented to multidisciplinary breast clinic on 10/23/15.   Right lumpectomy on 10/30/15 by Dr. Marlou Starks revealed grade 2 invasive ductal carcinoma spanning 1.4 cm, intermediate grade DCIS (ER 100%, PR negative, HER2 negative). The DCIS came to within 0.1 - 0.2 cm of the lateral margin focally and the resection margins were negative for invasive carcinoma One right axillary sentinel lymph node and one right sentinel lymph node biopsied were negatiive for carcinoma.  Oncotype DX score was 30, intermediate risk, 20% chance of recurrence in 10 years with Tamoxifen alone. Dr. Lindi Adie met with the patient and discussed adjuvant chemotherapy with Taxotere and Cytoxan every 3 weeks for 4 cycles.  The patient has completed chemotherapy (11/25/15 - 01/27/16) and presents today to discuss the role of radiation in the management of her disease.  The patient reports that her hips and thighs hurt from chemotherapy. She states she has become weaker from the chemo and takes Norco as needed. She denies lymphedema. She denies nipple discharge/bleeding of the breasts. She reports occasional shooting pain in the right breast.  ALLERGIES:  is allergic to horse-derived products.  Meds: Current Outpatient Prescriptions  Medication Sig Dispense Refill  . aspirin 81 MG tablet Take 81 mg by mouth daily.    Marland Kitchen atorvastatin (LIPITOR) 40 MG tablet Take 40 mg by mouth daily.    Marland Kitchen b  complex vitamins capsule Take 1 capsule by mouth daily.    . brimonidine-timolol (COMBIGAN) 0.2-0.5 % ophthalmic solution Place 1 drop into the left eye every 12 (twelve) hours.    . bumetanide (BUMEX) 1 MG tablet TAKE ONE TABLET BY MOUTH ONCE DAILY 30 tablet 0  . cycloSPORINE (RESTASIS) 0.05 % ophthalmic emulsion Place 1 drop into both eyes 2 (two) times daily.    Marland Kitchen esomeprazole (NEXIUM) 40 MG capsule Take 1 capsule (40 mg total) by mouth daily before breakfast. 30 capsule 1  . fluticasone (VERAMYST) 27.5 MCG/SPRAY nasal spray Place 2 sprays into the nose daily.    Marland Kitchen HYDROcodone-acetaminophen (NORCO) 5-325 MG tablet Take 1-2 tablets by mouth every 6 (six) hours as needed for moderate pain. 20 tablet 0  . ibuprofen (ADVIL,MOTRIN) 400 MG tablet Take 400 mg by mouth every 6 (six) hours as needed.    . latanoprost (XALATAN) 0.005 % ophthalmic solution Place 1 drop into both eyes at bedtime.    Marland Kitchen lisinopril-hydrochlorothiazide (PRINZIDE,ZESTORETIC) 20-12.5 MG per tablet Take 1 tablet by mouth daily.     Marland Kitchen LORazepam (ATIVAN) 0.5 MG tablet Take 1 tablet (0.5 mg total) by mouth every 6 (six) hours as needed (Nausea or vomiting). 30 tablet 0  . metFORMIN (GLUCOPHAGE-XR) 500 MG 24 hr tablet Take 500 mg by mouth daily with breakfast.     . Multiple Vitamins-Minerals (MULTIVITAMIN WITH MINERALS) tablet Take 1 tablet by mouth daily.    . ondansetron (ZOFRAN) 8 MG tablet TAKE ONE TABLET BY MOUTH TWICE DAILY AS NEEDED FOR  REFRACTORY  NAUSEA/VOMITING.  START  ON  DAY  3  AFTER  CHEMO 30 tablet 0  . ONETOUCH VERIO test strip     . potassium chloride (K-DUR) 10 MEQ tablet Take 10 mEq by mouth daily.    . prochlorperazine (COMPAZINE) 10 MG tablet TAKE ONE TABLET BY MOUTH EVERY 6 HOURS AS NEEDED FOR NAUSEA OR VOMITING 30 tablet 0  . Specialty Vitamins Products (MAGNESIUM, AMINO ACID CHELATE,) 133 MG tablet Take 1 tablet by mouth. '250mg'$     . traMADol (ULTRAM) 50 MG tablet Take 50 mg by mouth every 8 (eight) hours as  needed.     . fexofenadine (ALLEGRA) 180 MG tablet Take 180 mg by mouth daily.    Marland Kitchen HYDROcodone-acetaminophen (NORCO/VICODIN) 5-325 MG tablet Take 1-2 tablets by mouth every 4 (four) hours as needed for moderate pain or severe pain. (Patient not taking: Reported on 03/04/2016) 40 tablet 0  . lidocaine-prilocaine (EMLA) cream Apply to affected area once (Patient not taking: Reported on 03/04/2016) 30 g 3  . oxyCODONE-acetaminophen (ROXICET) 5-325 MG tablet Take 1-2 tablets by mouth every 4 (four) hours as needed. (Patient not taking: Reported on 03/04/2016) 30 tablet 0   No current facility-administered medications for this encounter.     Physical Findings: The patient is in no acute distress. Patient is alert and oriented.  height is '5\' 1"'$  (1.549 m) and weight is 236 lb 14.4 oz (107.5 kg). Her oral temperature is 98.1 F (36.7 C). Her blood pressure is 141/66 (abnormal) and her pulse is 87. Her oxygen saturation is 98%.   The patient presents to the clinic in a wheelchair, but is ambulatory with a cane. The patient had some difficulty getting onto the exam table. Lungs are clear to auscultation bilaterally. Heart has regular rhythm with a systolic murmur present. No palpable cervical, supraclavicular, or axillary adenopathy. The patient has a long scar in the UIQ of the right breast. No palpable mass in the right breast. No nipple discharge/bleeding. Separate scar in the right axilla from a sentinel node procedure.  Lab Findings: Lab Results  Component Value Date   WBC 11.2 (H) 01/27/2016   HGB 9.6 (L) 01/27/2016   HCT 30.0 (L) 01/27/2016   MCV 77.5 (L) 01/27/2016   PLT 279 01/27/2016    Radiographic Findings: No results found.  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  I spoke to the patient today regarding her diagnosis and options for treatment.  We discussed the equivalence in terms of survival and local failure between mastectomy and breast conservation. We discussed the role of radiation in decreasing local failures in patients who undergo lumpectomy. We discussed the process of CT simulation and the placement tattoos. We discussed 6 weeks of treatment as an outpatient. We discussed the possibility of asymptomatic lung damage. We discussed the low likelihood of secondary malignancies. We discussed the possible side effects including but not limited to skin redness, fatigue, permanent skin darkening, and breast swelling.  Plan: The patient agreed to proceed with radiation therapy. The patient signed a consent form and this was placed in her medical chart. CT simulation is scheduled at 1PM tomorrow. ____________________________________ -----------------------------------  Blair Promise, PhD, MD  This document serves as a record of services personally performed by Gery Pray, MD. It was created on his behalf by Darcus Austin, a trained medical scribe.  The creation of this record is based on the scribe's personal observations and the provider's statements to them. This document has been checked and approved by the attending provider.

## 2016-03-05 ENCOUNTER — Ambulatory Visit
Admission: RE | Admit: 2016-03-05 | Discharge: 2016-03-05 | Disposition: A | Discharge status: Home / Self Care | Payer: PPO | Source: Ambulatory Visit | Attending: Radiation Oncology | Admitting: Radiation Oncology

## 2016-03-05 DIAGNOSIS — C50211 Malignant neoplasm of upper-inner quadrant of right female breast: Secondary | ICD-10-CM | POA: Diagnosis not present

## 2016-03-05 DIAGNOSIS — Z510 Encounter for antineoplastic radiation therapy: Secondary | ICD-10-CM | POA: Diagnosis not present

## 2016-03-06 DIAGNOSIS — H04123 Dry eye syndrome of bilateral lacrimal glands: Secondary | ICD-10-CM | POA: Diagnosis not present

## 2016-03-06 DIAGNOSIS — H401132 Primary open-angle glaucoma, bilateral, moderate stage: Secondary | ICD-10-CM | POA: Diagnosis not present

## 2016-03-06 DIAGNOSIS — E119 Type 2 diabetes mellitus without complications: Secondary | ICD-10-CM | POA: Diagnosis not present

## 2016-03-06 DIAGNOSIS — H35342 Macular cyst, hole, or pseudohole, left eye: Secondary | ICD-10-CM | POA: Diagnosis not present

## 2016-03-09 DIAGNOSIS — C50211 Malignant neoplasm of upper-inner quadrant of right female breast: Secondary | ICD-10-CM | POA: Diagnosis not present

## 2016-03-09 NOTE — Progress Notes (Signed)
  Radiation Oncology         [phone] ________________________________  Name: Shelley Lawrence MRN: [id number]  Date: 03/05/2016  DOB: 1940-09-26  Optical Surface Tracking Plan:  Since intensity modulated radiotherapy (IMRT) and 3D conformal radiation treatment methods are predicated on accurate and precise positioning for treatment, intrafraction motion monitoring is medically necessary to ensure accurate and safe treatment delivery.  The ability to quantify intrafraction motion without excessive ionizing radiation dose can only be performed with optical surface tracking. Accordingly, surface imaging offers the opportunity to obtain 3D measurements of patient position throughout IMRT and 3D treatments without excessive radiation exposure.  I am ordering optical surface tracking for this patient's upcoming course of radiotherapy. ________________________________  Gery Pray, MD 03/09/2016 6:45 PM    Reference:   Particia Jasper, et al. Surface imaging-based analysis of intrafraction motion for breast radiotherapy patients.Journal of [place], n. 6, nov. 2014. ISSN [id number].   Available at: <http://www.jacmp.org/index.php/jacmp/article/view/4957>.

## 2016-03-09 NOTE — Progress Notes (Signed)
  Radiation Oncology         (336) [phone] ________________________________  Name: Shelley Lawrence MRN: [id number]  Date: 03/05/2016  DOB: 12/15/1940  SIMULATION AND TREATMENT PLANNING NOTE    ICD-9-CM ICD-10-CM   1. 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.  Identity was confirmed.  All relevant records and images related to the planned course of therapy were reviewed.  The patient freely provided informed written consent to proceed with treatment after reviewing the details related to the planned course of therapy. The consent form was witnessed and verified by the simulation staff.  Then, the patient was set-up in a stable reproducible  supine position for radiation therapy.  CT images were obtained.  Surface markings were placed.  The CT images were loaded into the planning software.  Then the target and avoidance structures were contoured.  Treatment planning then occurred.  The radiation prescription was entered and confirmed.  Then, I designed and supervised the construction of a total of 3 medically necessary complex treatment devices.  I have requested : 3D Simulation  I have requested a DVH of the following structures: lumpectomy cavity, lungs, heart.  I have ordered:dose calc.  PLAN:  The patient will receive 50.4 Gy in 28 fractions Followed by a boost to the lumpectomy cavity of 12 gray and a cumulative dose of 62.4 gray.  -----------------------------------  Blair Promise, PhD, MD

## 2016-03-11 DIAGNOSIS — C50211 Malignant neoplasm of upper-inner quadrant of right female breast: Secondary | ICD-10-CM | POA: Diagnosis not present

## 2016-03-11 DIAGNOSIS — Z510 Encounter for antineoplastic radiation therapy: Secondary | ICD-10-CM | POA: Diagnosis not present

## 2016-03-12 ENCOUNTER — Ambulatory Visit
Admission: RE | Admit: 2016-03-12 | Discharge: 2016-03-12 | Disposition: A | Discharge status: Home / Self Care | Payer: PPO | Source: Ambulatory Visit | Attending: Radiation Oncology | Admitting: Radiation Oncology

## 2016-03-12 DIAGNOSIS — Z510 Encounter for antineoplastic radiation therapy: Secondary | ICD-10-CM | POA: Diagnosis not present

## 2016-03-12 DIAGNOSIS — C50211 Malignant neoplasm of upper-inner quadrant of right female breast: Secondary | ICD-10-CM

## 2016-03-12 NOTE — Progress Notes (Signed)
  Radiation Oncology         [phone] ________________________________  Name: Shelley Lawrence MRN: [id number]  Date: 03/12/2016  DOB: 16-Apr-1941  Simulation Verification Note    ICD-9-CM ICD-10-CM   1. Malignant neoplasm of upper-inner quadrant of right female breast, unspecified estrogen receptor status ([place]) 174.2 C50.211     Status: Outpatient  NARRATIVE: The patient was brought to the treatment unit and placed in the planned treatment position. The clinical setup was verified. Then port films were obtained and uploaded to the radiation oncology medical record software.  The treatment beams were carefully compared against the planned radiation fields. The position location and shape of the radiation fields was reviewed. They targeted volume of tissue appears to be appropriately covered by the radiation beams. Organs at risk appear to be excluded as planned.  Based on my personal review, I approved the simulation verification. The patient's treatment will proceed as planned.  -----------------------------------  Blair Promise, PhD, MD  This document serves as a record of services personally performed by Gery Pray, MD. It was created on his behalf by Bethann Humble, a trained medical scribe. The creation of this record is based on the scribe's personal observations and the provider's statements to them. This document has been checked and approved by the attending provider.

## 2016-03-16 ENCOUNTER — Inpatient Hospital Stay
Admission: RE | Admit: 2016-03-16 | Discharge: 2016-03-16 | Disposition: A | Discharge status: Home / Self Care | Payer: Self-pay | Source: Ambulatory Visit | Attending: Radiation Oncology | Admitting: Radiation Oncology

## 2016-03-16 ENCOUNTER — Ambulatory Visit
Admission: RE | Admit: 2016-03-16 | Discharge: 2016-03-16 | Disposition: A | Discharge status: Home / Self Care | Payer: PPO | Source: Ambulatory Visit | Attending: Radiation Oncology | Admitting: Radiation Oncology

## 2016-03-16 DIAGNOSIS — C50211 Malignant neoplasm of upper-inner quadrant of right female breast: Secondary | ICD-10-CM | POA: Diagnosis not present

## 2016-03-16 DIAGNOSIS — Z510 Encounter for antineoplastic radiation therapy: Secondary | ICD-10-CM | POA: Diagnosis not present

## 2016-03-16 MED ORDER — RADIAPLEXRX EX GEL
Freq: Once | CUTANEOUS | Status: AC
  Administered 2016-03-16: 15:00:00 via TOPICAL

## 2016-03-16 MED ORDER — ALRA NON-METALLIC DEODORANT (RAD-ONC)
1.0000 "application " | Freq: Once | TOPICAL | Status: AC
  Administered 2016-03-16: 1 via TOPICAL

## 2016-03-16 NOTE — Progress Notes (Signed)
Pt here for patient teaching.  Pt given Radiation and You booklet, skin care instructions, Alra deodorant and Radiaplex gel. Pt reports they have refused to watch the Radiation Therapy Education video.  Reviewed areas of pertinence such as fatigue, skin changes, breast tenderness and breast swelling . Pt able to give teach back of to pat skin and use unscented/gentle soap,apply Radiaplex bid and avoid applying anything to skin within 4 hours of treatment. Pt demonstrated understanding and verbalizes understanding of information given and will contact nursing with any questions or concerns.

## 2016-03-17 ENCOUNTER — Encounter: Payer: Self-pay | Admitting: Radiation Oncology

## 2016-03-17 ENCOUNTER — Ambulatory Visit
Admission: RE | Admit: 2016-03-17 | Discharge: 2016-03-17 | Disposition: A | Discharge status: Home / Self Care | Payer: PPO | Source: Ambulatory Visit | Attending: Radiation Oncology | Admitting: Radiation Oncology

## 2016-03-17 VITALS — BP 154/78 | HR 85 | Temp 98.4°F | Ht 61.0 in | Wt 234.2 lb

## 2016-03-17 DIAGNOSIS — C50211 Malignant neoplasm of upper-inner quadrant of right female breast: Secondary | ICD-10-CM | POA: Diagnosis not present

## 2016-03-17 DIAGNOSIS — Z510 Encounter for antineoplastic radiation therapy: Secondary | ICD-10-CM | POA: Diagnosis not present

## 2016-03-17 NOTE — Progress Notes (Signed)
  Radiation Oncology         (336) [phone] ________________________________  Name: Shelley Lawrence MRN: [id number]  Date: 03/17/2016  DOB: 1940/10/19  Weekly Radiation Therapy Management    ICD-9-CM ICD-10-CM   1. Malignant neoplasm of upper-inner quadrant of right female breast, unspecified estrogen receptor status ([facility]) 174.2 C50.211        Current Dose: 3.6 Gy     Planned Dose:  50.4 Gy  Narrative . . . . . . . . The patient presents for routine under treatment assessment.                                   The patient is without complaint.                                 Set-up films were reviewed.                                 The chart was checked. [name] has completed 2 fractions to her right breast.  She denies having pain and fatigue.  She is using radiaplex.     Physical Findings. . .  height is 5\' 1"  (1.549 m) and weight is 234 lb 3.2 oz (106.2 kg). Her oral temperature is 98.4 F (36.9 C). Her blood pressure is 154/78 (abnormal) and her pulse is 85. Her oxygen saturation is 100%. . Weight essentially stable.  No significant changes. Lungs are clear to auscultation bilaterally. Heart has regular rate and rhythm. No palpable cervical, supraclavicular, or axillary adenopathy. Abdomen soft, non-tender, normal bowel sounds. No skin breakdown on the right breast or radiation reaction.   Impression . . . . . . . The patient is tolerating radiation. Plan . . . . . . . . . . . . Continue treatment as planned.  ________________________________   Blair Promise, PhD, MD  This document serves as a record of services personally performed by Gery Pray, MD. It was created on his behalf by Truddie Hidden, a trained medical scribe. The creation of this record is based on the scribe's personal observations and the provider's statements to them. This document has been checked and approved by the attending provider.

## 2016-03-17 NOTE — Progress Notes (Signed)
Shelley Lawrence has completed 2 fractions to her right breast.  She denies having pain and fatigue.  She is using radiaplex.  The skin on her right breast is intact.  BP (!) 154/78 (BP Location: Left Arm, Patient Position: Sitting)   Pulse 85   Temp 98.4 F (36.9 C) (Oral)   Ht 5\' 1"  (1.549 m)   Wt 234 lb 3.2 oz (106.2 kg)   SpO2 100%   BMI 44.25 kg/m    Wt Readings from Last 3 Encounters:  03/17/16 234 lb 3.2 oz (106.2 kg)  03/04/16 236 lb 14.4 oz (107.5 kg)  01/27/16 236 lb 1.6 oz (107.1 kg)

## 2016-03-18 ENCOUNTER — Ambulatory Visit
Admission: RE | Admit: 2016-03-18 | Discharge: 2016-03-18 | Disposition: A | Discharge status: Home / Self Care | Payer: PPO | Source: Ambulatory Visit | Attending: Radiation Oncology | Admitting: Radiation Oncology

## 2016-03-18 DIAGNOSIS — C50211 Malignant neoplasm of upper-inner quadrant of right female breast: Secondary | ICD-10-CM | POA: Diagnosis not present

## 2016-03-18 DIAGNOSIS — Z510 Encounter for antineoplastic radiation therapy: Secondary | ICD-10-CM | POA: Diagnosis not present

## 2016-03-19 ENCOUNTER — Ambulatory Visit
Admission: RE | Admit: 2016-03-19 | Discharge: 2016-03-19 | Disposition: A | Discharge status: Home / Self Care | Payer: PPO | Source: Ambulatory Visit | Attending: Radiation Oncology | Admitting: Radiation Oncology

## 2016-03-19 DIAGNOSIS — Z510 Encounter for antineoplastic radiation therapy: Secondary | ICD-10-CM | POA: Diagnosis not present

## 2016-03-19 DIAGNOSIS — C50211 Malignant neoplasm of upper-inner quadrant of right female breast: Secondary | ICD-10-CM | POA: Diagnosis not present

## 2016-03-20 ENCOUNTER — Ambulatory Visit
Admission: RE | Admit: 2016-03-20 | Discharge: 2016-03-20 | Disposition: A | Discharge status: Home / Self Care | Payer: PPO | Source: Ambulatory Visit | Attending: Radiation Oncology | Admitting: Radiation Oncology

## 2016-03-20 DIAGNOSIS — C50211 Malignant neoplasm of upper-inner quadrant of right female breast: Secondary | ICD-10-CM | POA: Diagnosis not present

## 2016-03-20 DIAGNOSIS — Z510 Encounter for antineoplastic radiation therapy: Secondary | ICD-10-CM | POA: Diagnosis not present

## 2016-03-23 ENCOUNTER — Ambulatory Visit
Admission: RE | Admit: 2016-03-23 | Discharge: 2016-03-23 | Disposition: A | Discharge status: Home / Self Care | Payer: PPO | Source: Ambulatory Visit | Attending: Radiation Oncology | Admitting: Radiation Oncology

## 2016-03-23 DIAGNOSIS — Z510 Encounter for antineoplastic radiation therapy: Secondary | ICD-10-CM | POA: Diagnosis not present

## 2016-03-23 DIAGNOSIS — C50211 Malignant neoplasm of upper-inner quadrant of right female breast: Secondary | ICD-10-CM | POA: Diagnosis not present

## 2016-03-24 ENCOUNTER — Encounter: Payer: Self-pay | Admitting: Radiation Oncology

## 2016-03-24 ENCOUNTER — Ambulatory Visit
Admission: RE | Admit: 2016-03-24 | Discharge: 2016-03-24 | Disposition: A | Discharge status: Home / Self Care | Payer: PPO | Source: Ambulatory Visit | Attending: Radiation Oncology | Admitting: Radiation Oncology

## 2016-03-24 VITALS — BP 162/72 | HR 76 | Temp 97.8°F | Ht 61.0 in | Wt 231.2 lb

## 2016-03-24 DIAGNOSIS — C50211 Malignant neoplasm of upper-inner quadrant of right female breast: Secondary | ICD-10-CM

## 2016-03-24 DIAGNOSIS — Z510 Encounter for antineoplastic radiation therapy: Secondary | ICD-10-CM | POA: Diagnosis not present

## 2016-03-24 MED ORDER — RADIAPLEXRX EX GEL
Freq: Once | CUTANEOUS | Status: AC
  Administered 2016-03-24: 14:00:00 via TOPICAL

## 2016-03-24 NOTE — Progress Notes (Signed)
  Radiation Oncology         (336) [phone] ________________________________  Name: JOHNENE Lawrence MRN: [id number]  Date: 03/24/2016  DOB: Sep 26, 1940  Weekly Radiation Therapy Management    ICD-9-CM ICD-10-CM   1. Malignant neoplasm of upper-inner quadrant of right female breast, unspecified estrogen receptor status ([facility]) 174.2 C50.211        Current Dose: 12.6 Gy     Planned Dose:  50.4 Gy  Narrative . . . . . . . . The patient presents for routine under treatment assessment. Shelley Lawrence has completed 7 fractions to her right breast.  She denies having pain but has felt short of breath with activity for the past month.  She has not been taking bumex recently.  She is using radiaplex and has been provided with a refill. The skin on her right breast is intact.  She denies having fatigue.  She reports that her right breast is starting to get a little bit sore, and has also been experiencing a little shortness of breath, onset one month ago. SOB is exacerbated by physical activity.  Set-up films were reviewed. Chart checked.  Physical Findings. . .  height is 5\' 1"  (1.549 m) and weight is 231 lb 3.2 oz (104.9 kg). Her oral temperature is 97.8 F (36.6 C). Her blood pressure is 162/72 (abnormal) and her pulse is 76. Her oxygen saturation is 100%. . Weight essentially stable.  No significant changes. Lungs are clear to auscultation bilaterally. Heart has regular rate and rhythm. No palpable cervical, supraclavicular, or axillary adenopathy. Abdomen soft, non-tender, normal bowel sounds. Slight hyperpigmentation changes in the right breast.  Impression . . . . . . . The patient is tolerating radiation. Plan . . . . . . . . . . . . Continue treatment as planned.  ________________________________   Blair Promise, PhD, MD  This document serves as a record of services personally performed by Gery Pray, MD. It was created on his behalf by Truddie Hidden, a trained medical scribe. The creation  of this record is based on the scribe's personal observations and the provider's statements to them. This document has been checked and approved by the attending provider.

## 2016-03-24 NOTE — Addendum Note (Signed)
Encounter addended by: Jacqulyn Liner, RN on: 03/24/2016  2:25 PM<BR>    Actions taken: [facility] administration accepted

## 2016-03-24 NOTE — Addendum Note (Signed)
Encounter addended by: Jacqulyn Liner, RN on: 03/24/2016  2:24 PM<BR>    Actions taken: Order Reconciliation Section accessed, Medication note saved, Order Entry activity accessed, Diagnosis association updated

## 2016-03-24 NOTE — Progress Notes (Signed)
Shelley Lawrence has completed 7 fractions to her right breast.  She denies having pain but has felt short of breath with activity for the past month.  She has not been taking bumex recently.  She is using radiaplex and has been provided with a refill. The skin on her right breast is intact.  She denies having fatigue.    BP (!) 162/72 (BP Location: Left Arm, Patient Position: Sitting)   Pulse 76   Temp 97.8 F (36.6 C) (Oral)   Ht 5\' 1"  (1.549 m)   Wt 231 lb 3.2 oz (104.9 kg)   SpO2 100%   BMI 43.68 kg/m    Wt Readings from Last 3 Encounters:  03/24/16 231 lb 3.2 oz (104.9 kg)  03/17/16 234 lb 3.2 oz (106.2 kg)  03/04/16 236 lb 14.4 oz (107.5 kg)

## 2016-03-25 ENCOUNTER — Ambulatory Visit
Admission: RE | Admit: 2016-03-25 | Discharge: 2016-03-25 | Disposition: A | Discharge status: Home / Self Care | Payer: PPO | Source: Ambulatory Visit | Attending: Radiation Oncology | Admitting: Radiation Oncology

## 2016-03-25 DIAGNOSIS — C50211 Malignant neoplasm of upper-inner quadrant of right female breast: Secondary | ICD-10-CM | POA: Diagnosis not present

## 2016-03-25 DIAGNOSIS — Z510 Encounter for antineoplastic radiation therapy: Secondary | ICD-10-CM | POA: Diagnosis not present

## 2016-03-26 ENCOUNTER — Ambulatory Visit
Admission: RE | Admit: 2016-03-26 | Discharge: 2016-03-26 | Disposition: A | Discharge status: Home / Self Care | Payer: PPO | Source: Ambulatory Visit | Attending: Radiation Oncology | Admitting: Radiation Oncology

## 2016-03-26 DIAGNOSIS — Z510 Encounter for antineoplastic radiation therapy: Secondary | ICD-10-CM | POA: Diagnosis not present

## 2016-03-26 DIAGNOSIS — C50211 Malignant neoplasm of upper-inner quadrant of right female breast: Secondary | ICD-10-CM | POA: Diagnosis not present

## 2016-03-27 ENCOUNTER — Ambulatory Visit
Admission: RE | Admit: 2016-03-27 | Discharge: 2016-03-27 | Disposition: A | Discharge status: Home / Self Care | Payer: PPO | Source: Ambulatory Visit | Attending: Radiation Oncology | Admitting: Radiation Oncology

## 2016-03-27 DIAGNOSIS — Z510 Encounter for antineoplastic radiation therapy: Secondary | ICD-10-CM | POA: Diagnosis not present

## 2016-03-27 DIAGNOSIS — C50211 Malignant neoplasm of upper-inner quadrant of right female breast: Secondary | ICD-10-CM | POA: Diagnosis not present

## 2016-03-30 ENCOUNTER — Ambulatory Visit
Admission: RE | Admit: 2016-03-30 | Discharge: 2016-03-30 | Disposition: A | Discharge status: Home / Self Care | Payer: PPO | Source: Ambulatory Visit | Attending: Radiation Oncology | Admitting: Radiation Oncology

## 2016-03-30 DIAGNOSIS — C50211 Malignant neoplasm of upper-inner quadrant of right female breast: Secondary | ICD-10-CM | POA: Diagnosis not present

## 2016-03-30 DIAGNOSIS — Z510 Encounter for antineoplastic radiation therapy: Secondary | ICD-10-CM | POA: Diagnosis not present

## 2016-03-31 ENCOUNTER — Encounter: Payer: Self-pay | Admitting: Radiation Oncology

## 2016-03-31 ENCOUNTER — Ambulatory Visit
Admission: RE | Admit: 2016-03-31 | Discharge: 2016-03-31 | Disposition: A | Discharge status: Home / Self Care | Payer: PPO | Source: Ambulatory Visit | Attending: Radiation Oncology | Admitting: Radiation Oncology

## 2016-03-31 VITALS — BP 144/87 | HR 80 | Temp 98.5°F | Resp 18 | Ht 61.0 in | Wt 226.4 lb

## 2016-03-31 DIAGNOSIS — Z510 Encounter for antineoplastic radiation therapy: Secondary | ICD-10-CM | POA: Diagnosis not present

## 2016-03-31 DIAGNOSIS — C50211 Malignant neoplasm of upper-inner quadrant of right female breast: Secondary | ICD-10-CM | POA: Diagnosis not present

## 2016-03-31 NOTE — Progress Notes (Signed)
  Radiation Oncology         [phone] ________________________________  Name: Shelley Lawrence MRN: [id number]  Date: 03/31/2016  DOB: 02/26/1941  Weekly Radiation Therapy Management    ICD-9-CM ICD-10-CM   1. Malignant neoplasm of upper-inner quadrant of right female breast, unspecified estrogen receptor status ([facility]) 174.2 C50.211         Current Dose: 21.6 Gy     Planned Dose:  50.4 Gy  Narrative . . . . . . . Marland Kitchen Marcea Brieger has completed 12 fractions to her right breast.  Having pain 1/10 to right breast. She has not been taking bumex recently. She is using radiaplex and has been provided with a refill. The skin on her right breast is intact with mild hyperpigmentation to breast and axilla. She denies having mild fatigue. The patient reported that she is starting to feel a little discomfort along her breast.  .  Set-up films were reviewed. Chart checked.  Physical Findings. . .  height is 5\' 1"  (1.549 m) and weight is 226 lb 6.4 oz (102.7 kg). Her oral temperature is 98.5 F (36.9 C). Her blood pressure is 144/87 (abnormal) and her pulse is 80. Her respiration is 18 and oxygen saturation is 100%. . Weight essentially stable.  No significant changes. Lungs are clear to auscultation bilaterally. Heart has regular rate and rhythm. No palpable cervical, supraclavicular, or axillary adenopathy. Abdomen soft, non-tender, normal bowel sounds. Mild hyperpigmentation changes in the right breast.  Impression . . . . . . . The patient is tolerating radiation. Plan . . . . . . . . . . . . Continue treatment as planned.  ________________________________   Blair Promise, PhD, MD  This document serves as a record of services personally performed by Gery Pray, MD. It was created on his behalf by Truddie Hidden, a trained medical scribe. The creation of this record is based on the scribe's personal observations and the provider's statements to them. This document has been checked and  approved by the attending provider.

## 2016-03-31 NOTE — Progress Notes (Signed)
Shelley Lawrence has completed 12 fractions to her right breast.  Having pain 1/10 to right breast.  She has not been taking bumex recently.  She is using radiaplex and has been provided with a refill. The skin on her right breast is intact with mild hyperpigmentation to breast and axilla..  She denies having mild fatigue.   Wt Readings from Last 3 Encounters:  03/31/16 226 lb 6.4 oz (102.7 kg)  03/24/16 231 lb 3.2 oz (104.9 kg)  03/17/16 234 lb 3.2 oz (106.2 kg)  BP (!) 144/87 (BP Location: Left Arm, Patient Position: Sitting, Cuff Size: Large)   Pulse 80   Temp 98.5 F (36.9 C) (Oral)   Resp 18   Ht 5\' 1"  (1.549 m)   Wt 226 lb 6.4 oz (102.7 kg)   SpO2 100%   BMI 42.78 kg/m

## 2016-04-01 ENCOUNTER — Ambulatory Visit
Admission: RE | Admit: 2016-04-01 | Discharge: 2016-04-01 | Disposition: A | Discharge status: Home / Self Care | Payer: PPO | Source: Ambulatory Visit | Attending: Radiation Oncology | Admitting: Radiation Oncology

## 2016-04-01 DIAGNOSIS — Z510 Encounter for antineoplastic radiation therapy: Secondary | ICD-10-CM | POA: Diagnosis not present

## 2016-04-01 DIAGNOSIS — C50211 Malignant neoplasm of upper-inner quadrant of right female breast: Secondary | ICD-10-CM | POA: Diagnosis not present

## 2016-04-02 ENCOUNTER — Ambulatory Visit
Admission: RE | Admit: 2016-04-02 | Discharge: 2016-04-02 | Disposition: A | Discharge status: Home / Self Care | Payer: PPO | Source: Ambulatory Visit | Attending: Radiation Oncology | Admitting: Radiation Oncology

## 2016-04-02 DIAGNOSIS — C50211 Malignant neoplasm of upper-inner quadrant of right female breast: Secondary | ICD-10-CM | POA: Diagnosis not present

## 2016-04-02 DIAGNOSIS — Z510 Encounter for antineoplastic radiation therapy: Secondary | ICD-10-CM | POA: Diagnosis not present

## 2016-04-03 ENCOUNTER — Ambulatory Visit
Admission: RE | Admit: 2016-04-03 | Discharge: 2016-04-03 | Disposition: A | Discharge status: Home / Self Care | Payer: PPO | Source: Ambulatory Visit | Attending: Radiation Oncology | Admitting: Radiation Oncology

## 2016-04-03 DIAGNOSIS — C50211 Malignant neoplasm of upper-inner quadrant of right female breast: Secondary | ICD-10-CM | POA: Diagnosis not present

## 2016-04-03 DIAGNOSIS — Z510 Encounter for antineoplastic radiation therapy: Secondary | ICD-10-CM | POA: Diagnosis not present

## 2016-04-06 ENCOUNTER — Ambulatory Visit
Admission: RE | Admit: 2016-04-06 | Discharge: 2016-04-06 | Disposition: A | Discharge status: Home / Self Care | Payer: PPO | Source: Ambulatory Visit | Attending: Radiation Oncology | Admitting: Radiation Oncology

## 2016-04-06 DIAGNOSIS — C50211 Malignant neoplasm of upper-inner quadrant of right female breast: Secondary | ICD-10-CM | POA: Diagnosis not present

## 2016-04-06 DIAGNOSIS — Z510 Encounter for antineoplastic radiation therapy: Secondary | ICD-10-CM | POA: Diagnosis not present

## 2016-04-07 ENCOUNTER — Ambulatory Visit
Admission: RE | Admit: 2016-04-07 | Discharge: 2016-04-07 | Disposition: A | Discharge status: Home / Self Care | Payer: PPO | Source: Ambulatory Visit | Attending: Radiation Oncology | Admitting: Radiation Oncology

## 2016-04-07 ENCOUNTER — Encounter: Payer: Self-pay | Admitting: Radiation Oncology

## 2016-04-07 VITALS — BP 139/71 | HR 86 | Temp 98.5°F | Ht 61.0 in | Wt 224.2 lb

## 2016-04-07 DIAGNOSIS — Z510 Encounter for antineoplastic radiation therapy: Secondary | ICD-10-CM | POA: Diagnosis not present

## 2016-04-07 DIAGNOSIS — C50211 Malignant neoplasm of upper-inner quadrant of right female breast: Secondary | ICD-10-CM

## 2016-04-07 NOTE — Progress Notes (Addendum)
  Radiation Oncology         [phone] ________________________________  Name: Shelley Lawrence MRN: [id number]  Date: 04/07/2016  DOB: 09-14-1940  Weekly Radiation Therapy Management    ICD-9-CM ICD-10-CM   1. Malignant neoplasm of upper-inner quadrant of right female breast, unspecified estrogen receptor status (HCC) 174.2 C50.211         Current Dose: 30.6 Gy     Planned Dose:  62.4 Gy  Narrative . . . . . . . [name] has completed 17 fractions to her right breast.  Patient reports having increased soreness in her right breast and nipple area. She denies having an increase in fatigue. She is using Radiaplex bid..  Set-up films were reviewed. Chart checked.  Physical Findings. . .  height is 5\' 1"  (1.549 m) and weight is 224 lb 3.2 oz (101.7 kg). Her oral temperature is 98.5 F (36.9 C). Her blood pressure is 139/71 and her pulse is 86. Her oxygen saturation is 100%. . Weight essentially stable.  No significant changes. Lungs are clear to auscultation bilaterally. Heart has regular rate and rhythm. No palpable cervical, supraclavicular, or axillary adenopathy. Abdomen soft, non-tender, normal bowel sounds. Erythema notes under the right arm and right breast. Mild hyperpigmentation changes in the right breast. No skin break down is noted. Impression . . . . . . . The patient is tolerating radiation. Plan . . . . . . . . . . . . Continue treatment as planned.  ________________________________   Blair Promise, PhD, MD  This document serves as a record of services personally performed by Gery Pray, MD. It was created on his behalf by Bethann Humble, a trained medical scribe. The creation of this record is based on the scribe's personal observations and the provider's statements to them. This document has been checked and approved by the attending provider.

## 2016-04-07 NOTE — Progress Notes (Addendum)
Shelley Lawrence has completed 17 fractions to her right breast.  She reports having some soreness in her right breast and nipple area.  She denies having an increase in fatigue.  She is using radiaplex bid.  The skin on her right breast is red especially under her arm and breast.  She has been given hydrogel pads to try.  BP 139/71 (BP Location: Left Wrist, Patient Position: Sitting)   Pulse 86   Temp 98.5 F (36.9 C) (Oral)   Ht 5\' 1"  (1.549 m)   Wt 224 lb 3.2 oz (101.7 kg)   SpO2 100%   BMI 42.36 kg/m    Wt Readings from Last 3 Encounters:  04/07/16 224 lb 3.2 oz (101.7 kg)  03/31/16 226 lb 6.4 oz (102.7 kg)  03/24/16 231 lb 3.2 oz (104.9 kg)

## 2016-04-08 ENCOUNTER — Ambulatory Visit
Admission: RE | Admit: 2016-04-08 | Discharge: 2016-04-08 | Disposition: A | Discharge status: Home / Self Care | Payer: PPO | Source: Ambulatory Visit | Attending: Radiation Oncology | Admitting: Radiation Oncology

## 2016-04-08 DIAGNOSIS — C50211 Malignant neoplasm of upper-inner quadrant of right female breast: Secondary | ICD-10-CM | POA: Diagnosis not present

## 2016-04-08 DIAGNOSIS — Z510 Encounter for antineoplastic radiation therapy: Secondary | ICD-10-CM | POA: Diagnosis not present

## 2016-04-09 ENCOUNTER — Ambulatory Visit
Admission: RE | Admit: 2016-04-09 | Discharge: 2016-04-09 | Disposition: A | Discharge status: Home / Self Care | Payer: PPO | Source: Ambulatory Visit | Attending: Radiation Oncology | Admitting: Radiation Oncology

## 2016-04-09 DIAGNOSIS — Z510 Encounter for antineoplastic radiation therapy: Secondary | ICD-10-CM | POA: Diagnosis not present

## 2016-04-09 DIAGNOSIS — C50211 Malignant neoplasm of upper-inner quadrant of right female breast: Secondary | ICD-10-CM | POA: Diagnosis not present

## 2016-04-10 ENCOUNTER — Ambulatory Visit
Admission: RE | Admit: 2016-04-10 | Discharge: 2016-04-10 | Disposition: A | Discharge status: Home / Self Care | Payer: PPO | Source: Ambulatory Visit | Attending: Radiation Oncology | Admitting: Radiation Oncology

## 2016-04-10 DIAGNOSIS — C50211 Malignant neoplasm of upper-inner quadrant of right female breast: Secondary | ICD-10-CM | POA: Diagnosis not present

## 2016-04-10 DIAGNOSIS — Z510 Encounter for antineoplastic radiation therapy: Secondary | ICD-10-CM | POA: Diagnosis not present

## 2016-04-13 ENCOUNTER — Ambulatory Visit
Admission: RE | Admit: 2016-04-13 | Discharge: 2016-04-13 | Disposition: A | Discharge status: Home / Self Care | Payer: PPO | Source: Ambulatory Visit | Attending: Radiation Oncology | Admitting: Radiation Oncology

## 2016-04-13 DIAGNOSIS — C50211 Malignant neoplasm of upper-inner quadrant of right female breast: Secondary | ICD-10-CM | POA: Diagnosis not present

## 2016-04-13 DIAGNOSIS — Z510 Encounter for antineoplastic radiation therapy: Secondary | ICD-10-CM | POA: Diagnosis not present

## 2016-04-14 ENCOUNTER — Encounter: Payer: Self-pay | Admitting: Radiation Oncology

## 2016-04-14 ENCOUNTER — Ambulatory Visit
Admission: RE | Admit: 2016-04-14 | Discharge: 2016-04-14 | Disposition: A | Discharge status: Home / Self Care | Payer: PPO | Source: Ambulatory Visit | Attending: Radiation Oncology | Admitting: Radiation Oncology

## 2016-04-14 VITALS — BP 133/72 | HR 80 | Temp 98.2°F | Ht 61.0 in | Wt 222.2 lb

## 2016-04-14 DIAGNOSIS — C50211 Malignant neoplasm of upper-inner quadrant of right female breast: Secondary | ICD-10-CM | POA: Insufficient documentation

## 2016-04-14 DIAGNOSIS — Z510 Encounter for antineoplastic radiation therapy: Secondary | ICD-10-CM | POA: Diagnosis not present

## 2016-04-14 MED ORDER — RADIAPLEXRX EX GEL
Freq: Once | CUTANEOUS | Status: AC
  Administered 2016-04-14: 16:00:00 via TOPICAL

## 2016-04-14 NOTE — Progress Notes (Signed)
  Radiation Oncology         [phone] ________________________________  Name: Shelley Lawrence MRN: [id number]  Date: 04/14/2016  DOB: 1940-11-18  Weekly Radiation Therapy Management    ICD-9-CM ICD-10-CM   1. Malignant neoplasm of upper-inner quadrant of right female breast, unspecified estrogen receptor status (HCC) 174.2 C50.211 hyaluronate sodium (RADIAPLEXRX) gel        Current Dose: 30.6 Gy     Planned Dose:  62.4 Gy  Narrative . . . . . . . . The patient presents for routine under treatment assessment.  Dejuan has completed 22 fractions to her right breast.  She reports having some pain in her left breast that she is rating as a 2/10.  She reports having slight fatigue.  She is using radiaplex and has been provided with a refill.  She is also using hydrogel pads.  The skin on her right breast is red with hyperpigmentation.  She has small peeling area under her breast.  The nurse advised her to apply neosporin to this area.           Set-up films were reviewed.         Chart checked.  Physical Findings. . .  height is 5\' 1"  (1.549 m) and weight is 222 lb 3.2 oz (100.8 kg). Her oral temperature is 98.2 F (36.8 C). Her blood pressure is 133/72 and her pulse is 80. Her oxygen saturation is 100%. . Weight essentially stable.  No significant changes. Lungs are clear to auscultation bilaterally. Heart has regular rate and rhythm. Hyperpigmentation changes and erythema. Mild skin breakdown in the inframammary fold. Impression . . . . . . . The patient is tolerating radiation. Plan . . . . . . . . . . . . Continue treatment as planned.  ________________________________   Blair Promise, PhD, MD  This document serves as a record of services personally performed by Gery Pray, MD. It was created on his behalf by Darcus Austin, a trained medical scribe. The creation of this record is based on the scribe's personal observations and the provider's statements to them. This document  has been checked and approved by the attending provider.

## 2016-04-14 NOTE — Progress Notes (Signed)
Shelley Lawrence has completed 22 fractions to her right breast.  She reports having some pain in her left breast that she is rating at a 2/10.  She reports having slight fatigue.  She is using radiaplex and has been provided with a refill.  She is also using hydrogel pads.  The skin on her right breast is red with hyperpigmentation.  She has small peeling area under her breast.  Advised her to apply neosporin to this area.  BP 133/72 (BP Location: Left Wrist, Patient Position: Sitting)   Pulse 80   Temp 98.2 F (36.8 C) (Oral)   Ht 5\' 1"  (1.549 m)   Wt 222 lb 3.2 oz (100.8 kg)   SpO2 100%   BMI 41.98 kg/m    Wt Readings from Last 3 Encounters:  04/14/16 222 lb 3.2 oz (100.8 kg)  04/07/16 224 lb 3.2 oz (101.7 kg)  03/31/16 226 lb 6.4 oz (102.7 kg)

## 2016-04-15 ENCOUNTER — Ambulatory Visit
Admission: RE | Admit: 2016-04-15 | Discharge: 2016-04-15 | Disposition: A | Discharge status: Home / Self Care | Payer: PPO | Source: Ambulatory Visit | Attending: Radiation Oncology | Admitting: Radiation Oncology

## 2016-04-15 DIAGNOSIS — Z510 Encounter for antineoplastic radiation therapy: Secondary | ICD-10-CM | POA: Diagnosis not present

## 2016-04-15 DIAGNOSIS — C50211 Malignant neoplasm of upper-inner quadrant of right female breast: Secondary | ICD-10-CM | POA: Diagnosis not present

## 2016-04-16 ENCOUNTER — Ambulatory Visit
Admission: RE | Admit: 2016-04-16 | Discharge: 2016-04-16 | Disposition: A | Discharge status: Home / Self Care | Payer: PPO | Source: Ambulatory Visit | Attending: Radiation Oncology | Admitting: Radiation Oncology

## 2016-04-16 DIAGNOSIS — C50211 Malignant neoplasm of upper-inner quadrant of right female breast: Secondary | ICD-10-CM | POA: Diagnosis not present

## 2016-04-16 DIAGNOSIS — Z510 Encounter for antineoplastic radiation therapy: Secondary | ICD-10-CM | POA: Diagnosis not present

## 2016-04-17 ENCOUNTER — Ambulatory Visit
Admission: RE | Admit: 2016-04-17 | Discharge: 2016-04-17 | Disposition: A | Discharge status: Home / Self Care | Payer: PPO | Source: Ambulatory Visit | Attending: Radiation Oncology | Admitting: Radiation Oncology

## 2016-04-17 DIAGNOSIS — Z510 Encounter for antineoplastic radiation therapy: Secondary | ICD-10-CM | POA: Diagnosis not present

## 2016-04-17 DIAGNOSIS — C50211 Malignant neoplasm of upper-inner quadrant of right female breast: Secondary | ICD-10-CM | POA: Diagnosis not present

## 2016-04-20 ENCOUNTER — Ambulatory Visit
Admission: RE | Admit: 2016-04-20 | Discharge: 2016-04-20 | Disposition: A | Discharge status: Home / Self Care | Payer: PPO | Source: Ambulatory Visit | Attending: Radiation Oncology | Admitting: Radiation Oncology

## 2016-04-20 ENCOUNTER — Encounter: Payer: Self-pay | Admitting: Radiation Oncology

## 2016-04-20 DIAGNOSIS — Z510 Encounter for antineoplastic radiation therapy: Secondary | ICD-10-CM | POA: Diagnosis not present

## 2016-04-20 DIAGNOSIS — C50211 Malignant neoplasm of upper-inner quadrant of right female breast: Secondary | ICD-10-CM

## 2016-04-20 NOTE — Progress Notes (Signed)
  Radiation Oncology         (336) [phone] ________________________________  Name: Shelley Lawrence MRN: [id number]  Date: 04/20/2016  DOB: March 01, 1941  SIMULATION NOTE    ICD-9-CM ICD-10-CM   1. Malignant neoplasm of upper-inner quadrant of right female breast, unspecified estrogen receptor status ([place]) 174.2 C50.211       NARRATIVE: Today the patient underwent additional planning for radiation therapy directed at the right breast. The patient's treatment planning CT scan was reviewed and she had set up of a custom photon boost directed at the lumpectomy cavity within the right breast. Patient will be treated with 3 separate multileaf collimator beams. A computerized isodose plan will be generated for treatment. A combination of 10x, 6x beams will be used for the patient's treatment.   .  I have ordered:dose calc.  PLAN:  The patient will receive 12 Gy in 6 fractions.   -----------------------------------  Blair Promise, PhD, MD

## 2016-04-21 ENCOUNTER — Ambulatory Visit
Admission: RE | Admit: 2016-04-21 | Discharge: 2016-04-21 | Disposition: A | Discharge status: Home / Self Care | Payer: PPO | Source: Ambulatory Visit | Attending: Radiation Oncology | Admitting: Radiation Oncology

## 2016-04-21 ENCOUNTER — Encounter: Payer: Self-pay | Admitting: Radiation Oncology

## 2016-04-21 ENCOUNTER — Ambulatory Visit: Payer: PPO | Admitting: Radiation Oncology

## 2016-04-21 VITALS — BP 158/84 | HR 84 | Temp 98.6°F | Ht 61.0 in | Wt 218.8 lb

## 2016-04-21 DIAGNOSIS — Z510 Encounter for antineoplastic radiation therapy: Secondary | ICD-10-CM | POA: Diagnosis not present

## 2016-04-21 DIAGNOSIS — C50211 Malignant neoplasm of upper-inner quadrant of right female breast: Secondary | ICD-10-CM | POA: Diagnosis not present

## 2016-04-21 NOTE — Progress Notes (Signed)
  Radiation Oncology         (336) [phone] ________________________________  Name: Shelley Lawrence MRN: [id number]  Date: 04/21/2016  DOB: 09/11/1940  Weekly Radiation Therapy Management    ICD-9-CM ICD-10-CM   1. Malignant neoplasm of upper-inner quadrant of right female breast, unspecified estrogen receptor status (HCC) 174.2 C50.211      Current Dose: 48.6 Gy     Planned Dose:  62.4 Gy  Narrative . . . . . . . . The patient presents for routine under treatment assessment.                                [name] has completed 27 fractions to her right breast.  She reports having pain in nipple area.  She denies an increase in fatigue.  She also reports that she has a cold that started on Friday.  She is taking Robitussin and Mucinex.  She is using radiaplex, neosporin on peeling areas and hydrogel pads.  The skin on her right breast is ried with hyperpigmentation.  She has small, dry peeling areas under her right arm and breast.                                    Set-up films were reviewed.                                 The chart was checked. Physical Findings. . .  height is 5\' 1"  (1.549 m) and weight is 218 lb 12.8 oz (99.2 kg). Her oral temperature is 98.6 F (37 C). Her blood pressure is 158/84 (abnormal) and her pulse is 84. Her oxygen saturation is 97%. . Weight essentially stable.  Lungs are clear to auscultation bilaterally. Heart has regular rate and rhythm. No palpable cervical, supraclavicular, or axillary adenopathy. Abdomen soft, non-tender, normal bowel sounds. No signs of moist desquamation.  Some peeling noted but no signs of infection within the right breast.  Impression . . . . . . . The patient is tolerating radiation. Plan . . . . . . . . . . . . Continue treatment as planned.  ________________________________   Blair Promise, PhD, MD

## 2016-04-21 NOTE — Progress Notes (Signed)
Shelley Lawrence has completed 27 fractions to her right breast.  She reports having pain in nipple area.  She denies an increase in fatigue.  She also reports that she has a cold that started on Friday.  She is taking Robitussin and Mucinex.  She is using radiaplex, neosporin on peeling areas and hydrogel pads.  The skin on her right breast is ried with hyperpigmentation.  She has small, dry peeling areas under her right arm and breast.  . BP (!) 173/81 (BP Location: Left Wrist, Patient Position: Sitting)   Pulse 84   Temp 98.6 F (37 C) (Oral)   Ht 5\' 1"  (1.549 m)   Wt 218 lb 12.8 oz (99.2 kg)   SpO2 97%   BMI 41.34 kg/m    Wt Readings from Last 3 Encounters:  04/21/16 218 lb 12.8 oz (99.2 kg)  04/14/16 222 lb 3.2 oz (100.8 kg)  04/07/16 224 lb 3.2 oz (101.7 kg)

## 2016-04-22 ENCOUNTER — Ambulatory Visit
Admission: RE | Admit: 2016-04-22 | Discharge: 2016-04-22 | Disposition: A | Discharge status: Home / Self Care | Payer: PPO | Source: Ambulatory Visit | Attending: Radiation Oncology | Admitting: Radiation Oncology

## 2016-04-22 DIAGNOSIS — C50211 Malignant neoplasm of upper-inner quadrant of right female breast: Secondary | ICD-10-CM | POA: Diagnosis not present

## 2016-04-22 DIAGNOSIS — Z510 Encounter for antineoplastic radiation therapy: Secondary | ICD-10-CM | POA: Diagnosis not present

## 2016-04-23 ENCOUNTER — Ambulatory Visit
Admission: RE | Admit: 2016-04-23 | Discharge: 2016-04-23 | Disposition: A | Discharge status: Home / Self Care | Payer: PPO | Source: Ambulatory Visit | Attending: Radiation Oncology | Admitting: Radiation Oncology

## 2016-04-23 DIAGNOSIS — Z510 Encounter for antineoplastic radiation therapy: Secondary | ICD-10-CM | POA: Diagnosis not present

## 2016-04-23 DIAGNOSIS — C50211 Malignant neoplasm of upper-inner quadrant of right female breast: Secondary | ICD-10-CM

## 2016-04-23 MED ORDER — RADIAPLEXRX EX GEL
Freq: Once | CUTANEOUS | Status: AC
  Administered 2016-04-23: 12:00:00 via TOPICAL

## 2016-04-24 ENCOUNTER — Ambulatory Visit
Admission: RE | Admit: 2016-04-24 | Discharge: 2016-04-24 | Disposition: A | Discharge status: Home / Self Care | Payer: PPO | Source: Ambulatory Visit | Attending: Radiation Oncology | Admitting: Radiation Oncology

## 2016-04-24 DIAGNOSIS — Z510 Encounter for antineoplastic radiation therapy: Secondary | ICD-10-CM | POA: Diagnosis not present

## 2016-04-24 DIAGNOSIS — C50211 Malignant neoplasm of upper-inner quadrant of right female breast: Secondary | ICD-10-CM | POA: Diagnosis not present

## 2016-04-26 ENCOUNTER — Ambulatory Visit
Admission: RE | Admit: 2016-04-26 | Discharge: 2016-04-26 | Disposition: A | Discharge status: Home / Self Care | Payer: PPO | Source: Ambulatory Visit | Attending: Radiation Oncology | Admitting: Radiation Oncology

## 2016-04-26 ENCOUNTER — Ambulatory Visit: Payer: PPO

## 2016-04-26 DIAGNOSIS — Z510 Encounter for antineoplastic radiation therapy: Secondary | ICD-10-CM | POA: Diagnosis not present

## 2016-04-26 DIAGNOSIS — C50211 Malignant neoplasm of upper-inner quadrant of right female breast: Secondary | ICD-10-CM | POA: Diagnosis not present

## 2016-04-26 NOTE — Assessment & Plan Note (Signed)
Right lumpectomy 10/30/2015: IDC grade 2, 1.4 cm, IG DCIS, margins negative, 0/2 lymph nodes negative, ER 100%, PR 0%, HER-2 negative, Ki-67 60 %, T1 cN0 stage IA Oncotype DX score 30, 20% risk of recurrence  Treatment plan: 1. Adjuvant chemotherapy With Taxotere and Cytoxan 4 started 11/25/2015 completed 01/27/2016 2. Followed by adjuvant radiation therapy completed 03/24/16 3. Followed by adjuvant antiestrogen therapy with Anastrozole 1 mg daily ------------------------------------------------------------------------------------------------------------------- Anastrozole counseling: We discussed the risks and benefits of anti-estrogen therapy with aromatase inhibitors. These include but not limited to insomnia, hot flashes, mood changes, vaginal dryness, bone density loss, and weight gain. We strongly believe that the benefits far outweigh the risks. Patient understands these risks and consented to starting treatment. Planned treatment duration is 5 years.  RTC in 3 months for tox check

## 2016-04-27 ENCOUNTER — Ambulatory Visit (HOSPITAL_BASED_OUTPATIENT_CLINIC_OR_DEPARTMENT_OTHER): Payer: PPO | Admitting: Hematology and Oncology

## 2016-04-27 ENCOUNTER — Ambulatory Visit: Payer: PPO

## 2016-04-27 ENCOUNTER — Encounter: Payer: Self-pay | Admitting: Hematology and Oncology

## 2016-04-27 ENCOUNTER — Ambulatory Visit
Admission: RE | Admit: 2016-04-27 | Discharge: 2016-04-27 | Disposition: A | Discharge status: Home / Self Care | Payer: PPO | Source: Ambulatory Visit | Attending: Radiation Oncology | Admitting: Radiation Oncology

## 2016-04-27 ENCOUNTER — Other Ambulatory Visit (HOSPITAL_BASED_OUTPATIENT_CLINIC_OR_DEPARTMENT_OTHER): Payer: PPO

## 2016-04-27 DIAGNOSIS — Z170 Estrogen receptor positive status [ER+]: Secondary | ICD-10-CM | POA: Diagnosis not present

## 2016-04-27 DIAGNOSIS — Z510 Encounter for antineoplastic radiation therapy: Secondary | ICD-10-CM | POA: Diagnosis not present

## 2016-04-27 DIAGNOSIS — C50211 Malignant neoplasm of upper-inner quadrant of right female breast: Secondary | ICD-10-CM

## 2016-04-27 DIAGNOSIS — Z95828 Presence of other vascular implants and grafts: Secondary | ICD-10-CM

## 2016-04-27 LAB — COMPREHENSIVE METABOLIC PANEL
ALBUMIN: 3.4 g/dL — AB (ref 3.5–5.0)
ALK PHOS: 92 U/L (ref 40–150)
ALT: 20 U/L (ref 0–55)
ANION GAP: 8 meq/L (ref 3–11)
AST: 21 U/L (ref 5–34)
BILIRUBIN TOTAL: 0.25 mg/dL (ref 0.20–1.20)
BUN: 12.3 mg/dL (ref 7.0–26.0)
CALCIUM: 9.5 mg/dL (ref 8.4–10.4)
CHLORIDE: 106 meq/L (ref 98–109)
CO2: 26 mEq/L (ref 22–29)
CREATININE: 0.6 mg/dL (ref 0.6–1.1)
EGFR: >90 mL/min/{1.73_m2} (ref >90)
Glucose: 138 mg/dl (ref 70–140)
Potassium: 3.9 mEq/L (ref 3.5–5.1)
Sodium: 140 mEq/L (ref 136–145)
Total Protein: 6.8 g/dL (ref 6.4–8.3)

## 2016-04-27 LAB — CBC WITH DIFFERENTIAL/PLATELET
BASO%: 0.4 % (ref 0.0–2.0)
BASOS ABS: 0 10*3/uL (ref 0.0–0.1)
EOS%: 2.9 % (ref 0.0–7.0)
Eosinophils Absolute: 0.1 10*3/uL (ref 0.0–0.5)
HEMATOCRIT: 34.6 % — AB (ref 34.8–46.6)
HEMOGLOBIN: 11 g/dL — AB (ref 11.6–15.9)
LYMPH#: 0.9 10*3/uL (ref 0.9–3.3)
LYMPH%: 17.3 % (ref 14.0–49.7)
MCH: 24.4 pg — ABNORMAL LOW (ref 25.1–34.0)
MCHC: 31.9 g/dL (ref 31.5–36.0)
MCV: 76.6 fL — ABNORMAL LOW (ref 79.5–101.0)
MONO#: 0.6 10*3/uL (ref 0.1–0.9)
MONO%: 11.2 % (ref 0.0–14.0)
NEUT#: 3.6 10*3/uL (ref 1.5–6.5)
NEUT%: 68.2 % (ref 38.4–76.8)
PLATELETS: 243 10*3/uL (ref 145–400)
RBC: 4.52 10*6/uL (ref 3.70–5.45)
RDW: 14.8 % — AB (ref 11.2–14.5)
WBC: 5.2 10*3/uL (ref 3.9–10.3)

## 2016-04-27 MED ORDER — ANASTROZOLE 1 MG PO TABS: 1.0000 mg | ORAL_TABLET | Freq: Every day | ORAL | 3 refills | Status: DC

## 2016-04-27 MED ORDER — HEPARIN SOD (PORK) LOCK FLUSH 100 UNIT/ML IV SOLN
500.0000 [IU] | Freq: Once | INTRAVENOUS | Status: AC | PRN
  Administered 2016-04-27: 500 [IU] via INTRAVENOUS
  Filled 2016-04-27: qty 5

## 2016-04-27 MED ORDER — SODIUM CHLORIDE 0.9 % IJ SOLN
10.0000 mL | INTRAMUSCULAR | Status: DC | PRN
  Administered 2016-04-27: 10 mL via INTRAVENOUS
  Filled 2016-04-27: qty 10

## 2016-04-27 NOTE — Progress Notes (Signed)
Patient Care Team: Jani Gravel, MD as PCP - General (Internal Medicine) Autumn Messing III, MD as Consulting Physician (General Surgery) Nicholas Lose, MD as Consulting Physician (Oncology) Gery Pray, MD as Consulting Physician (Radiation Oncology) Sylvan Cheese, NP as Nurse Practitioner (Hematology and Oncology)  DIAGNOSIS:  Encounter Diagnosis  Name Primary?  . Malignant neoplasm of upper-inner quadrant of right breast in female, estrogen receptor positive ([place])     SUMMARY OF ONCOLOGIC HISTORY:   Breast cancer of upper-inner quadrant of right female breast ([place])   10/14/2015 Initial Diagnosis    Screening detected Right breast mass at 1:00:1.1 x 0.8 x 0.7 cm,grade 2 IDC plus DCIS, ER 100 present, PR 0%, HER-2 negative ratio 1.64, Ki-67 60%, T1cN0 stage IA      10/30/2015 Surgery    Right lumpectomy: IDC grade 2, 1.4 cm, IG DCIS, margins negative, 0/2 lymph nodes negative, ER 100%, PR 0%, HER-2 negative, Ki-67 60 %, T1 cN0 stage IA, Oncotype DX score 30, 20% risk of recurrence      11/25/2015 - 01/27/2016 Chemotherapy    Adjuvant chemotherapy with Taxotere and Cytoxan every 3 weeks 4 cycles      03/16/2016 - 04/24/2016 Radiation Therapy     Adj XRT       CHIEF COMPLIANT:  Follow-up after adjuvant radiation  INTERVAL HISTORY: Shelley Lawrence is a 75 year old with above-mentioned history of right breast cancer who underwent lumpectomy followed by adjuvant chemotherapy and completed recent radiation. She has radiation dermatitis. Her major complaints of related to bilateral lower extremity pain and discomfort. She was taking ibuprofen up to 10 tablets daily. She tells me that her symptoms are slightly better. She also takes tramadol but does not think, as well as doing anything for her. She describes as pain in the legs as constant throbbing and aching in her legs especially when she walksbut any short or long distances. She's not been able to do grocery shopping she has to  use a scooter for her grocery shopping.  REVIEW OF SYSTEMS:   Constitutional: Denies fevers, chills or abnormal weight loss Eyes: Denies blurriness of vision Ears, nose, mouth, throat, and face: Denies mucositis or sore throat Respiratory: Denies cough, dyspnea or wheezes Cardiovascular: Denies palpitation, chest discomfort Gastrointestinal:  Denies nausea, heartburn or change in bowel habits Skin: Denies abnormal skin rashes Lymphatics: Denies new lymphadenopathy or easy bruising Neurological:Denies numbness, tingling or new weaknesses Behavioral/Psych: Mood is stable, no new changes  Extremities: lower extremity pain and discomfort Breast:  denies any pain or lumps or nodules in either breasts All other systems were reviewed with the patient and are negative.  I have reviewed the past medical history, past surgical history, social history and family history with the patient and they are unchanged from previous note.  ALLERGIES:  is allergic to horse-derived products.  MEDICATIONS:  Current Outpatient Prescriptions  Medication Sig Dispense Refill  . [START ON 06/08/2016] anastrozole (ARIMIDEX) 1 MG tablet Take 1 tablet (1 mg total) by mouth daily. 90 tablet 3  . aspirin 81 MG tablet Take 81 mg by mouth daily.    Marland Kitchen atorvastatin (LIPITOR) 40 MG tablet Take 40 mg by mouth daily.    Marland Kitchen b complex vitamins capsule Take 1 capsule by mouth daily.    . brimonidine-timolol (COMBIGAN) 0.2-0.5 % ophthalmic solution Place 1 drop into the left eye every 12 (twelve) hours.    . bumetanide (BUMEX) 1 MG tablet TAKE ONE TABLET BY MOUTH ONCE DAILY (Patient not taking:  Reported on 04/21/2016) 30 tablet 0  . cycloSPORINE (RESTASIS) 0.05 % ophthalmic emulsion Place 1 drop into both eyes 2 (two) times daily.    Marland Kitchen esomeprazole (NEXIUM) 40 MG capsule Take 1 capsule (40 mg total) by mouth daily before breakfast. 30 capsule 1  . fexofenadine (ALLEGRA) 180 MG tablet Take 180 mg by mouth daily.    . fluticasone  (VERAMYST) 27.5 MCG/SPRAY nasal spray Place 2 sprays into the nose daily.    Marland Kitchen guaiFENesin (MUCINEX) 600 MG 12 hr tablet Take by mouth 2 (two) times daily.    Marland Kitchen guaiFENesin (ROBITUSSIN) 100 MG/5ML liquid Take 200 mg by mouth 3 (three) times daily as needed for cough.    . hyaluronate sodium (RADIAPLEXRX) GEL Apply 1 application topically 2 (two) times daily.    Marland Kitchen HYDROcodone-acetaminophen (NORCO) 5-325 MG tablet Take 1-2 tablets by mouth every 6 (six) hours as needed for moderate pain. (Patient not taking: Reported on 04/21/2016) 20 tablet 0  . HYDROcodone-acetaminophen (NORCO/VICODIN) 5-325 MG tablet Take 1-2 tablets by mouth every 4 (four) hours as needed for moderate pain or severe pain. (Patient not taking: Reported on 04/21/2016) 40 tablet 0  . ibuprofen (ADVIL,MOTRIN) 400 MG tablet Take 400 mg by mouth every 6 (six) hours as needed.    . latanoprost (XALATAN) 0.005 % ophthalmic solution Place 1 drop into both eyes at bedtime.    . lidocaine-prilocaine (EMLA) cream Apply to affected area once 30 g 3  . lisinopril-hydrochlorothiazide (PRINZIDE,ZESTORETIC) 20-12.5 MG per tablet Take 1 tablet by mouth daily.     Marland Kitchen LORazepam (ATIVAN) 0.5 MG tablet Take 1 tablet (0.5 mg total) by mouth every 6 (six) hours as needed (Nausea or vomiting). 30 tablet 0  . metFORMIN (GLUCOPHAGE-XR) 500 MG 24 hr tablet Take 500 mg by mouth daily with breakfast.     . Multiple Vitamins-Minerals (MULTIVITAMIN WITH MINERALS) tablet Take 1 tablet by mouth daily.    . non-metallic deodorant Jethro Poling) MISC Apply 1 application topically daily as needed.    . ondansetron (ZOFRAN) 8 MG tablet TAKE ONE TABLET BY MOUTH TWICE DAILY AS NEEDED FOR  REFRACTORY  NAUSEA/VOMITING.  START  ON  DAY  3  AFTER  CHEMO 30 tablet 0  . ONETOUCH VERIO test strip     . oxyCODONE-acetaminophen (ROXICET) 5-325 MG tablet Take 1-2 tablets by mouth every 4 (four) hours as needed. (Patient not taking: Reported on 04/21/2016) 30 tablet 0  . potassium chloride  (K-DUR) 10 MEQ tablet Take 10 mEq by mouth daily.    . prochlorperazine (COMPAZINE) 10 MG tablet TAKE ONE TABLET BY MOUTH EVERY 6 HOURS AS NEEDED FOR NAUSEA OR VOMITING 30 tablet 0  . Specialty Vitamins Products (MAGNESIUM, AMINO ACID CHELATE,) 133 MG tablet Take 1 tablet by mouth. '250mg'$     . traMADol (ULTRAM) 50 MG tablet Take 50 mg by mouth every 8 (eight) hours as needed.      No current facility-administered medications for this visit.     PHYSICAL EXAMINATION: ECOG PERFORMANCE STATUS: 1 - Symptomatic but completely ambulatory  Vitals:   04/27/16 1100  BP: (!) 150/67  Pulse: 83  Resp: 19  Temp: 97.7 F (36.5 C)   Filed Weights   04/27/16 1100  Weight: 218 lb 14.4 oz (99.3 kg)    GENERAL:alert, no distress and comfortable SKIN: skin color, texture, turgor are normal, no rashes or significant lesions EYES: normal, Conjunctiva are pink and non-injected, sclera clear OROPHARYNX:no exudate, no erythema and lips, buccal mucosa, and tongue  normal  NECK: supple, thyroid normal size, non-tender, without nodularity LYMPH:  no palpable lymphadenopathy in the cervical, axillary or inguinal LUNGS: clear to auscultation and percussion with normal breathing effort HEART: regular rate & rhythm and no murmurs and no lower extremity edema ABDOMEN:abdomen soft, non-tender and normal bowel sounds MUSCULOSKELETAL:no cyanosis of digits and no clubbing  NEURO: alert & oriented x 3 with fluent speech, no focal motor/sensory deficits EXTREMITIES: No lower extremity edema  LABORATORY DATA:  I have reviewed the data as listed   Chemistry      Component Value Date/Time   NA 140 04/27/2016 1105   K 3.9 04/27/2016 1105   CO2 26 04/27/2016 1105   BUN 12.3 04/27/2016 1105   CREATININE 0.6 04/27/2016 1105      Component Value Date/Time   CALCIUM 9.5 04/27/2016 1105   ALKPHOS 92 04/27/2016 1105   AST 21 04/27/2016 1105   ALT 20 04/27/2016 1105   BILITOT 0.25 04/27/2016 1105       Lab  Results  Component Value Date   WBC 5.2 04/27/2016   HGB 11.0 (L) 04/27/2016   HCT 34.6 (L) 04/27/2016   MCV 76.6 (L) 04/27/2016   PLT 243 04/27/2016   NEUTROABS 3.6 04/27/2016     ASSESSMENT & PLAN:  Breast cancer of upper-inner quadrant of right female breast ([place]) Right lumpectomy 10/30/2015: IDC grade 2, 1.4 cm, IG DCIS, margins negative, 0/2 lymph nodes negative, ER 100%, PR 0%, HER-2 negative, Ki-67 60 %, T1 cN0 stage IA Oncotype DX score 30, 20% risk of recurrence  Treatment plan: 1. Adjuvant chemotherapy With Taxotere and Cytoxan 4 started 11/25/2015 completed 01/27/2016 2. Followed by adjuvant radiation therapy completed 03/24/16 3. Followed by adjuvant antiestrogen therapy with Anastrozole 1 mg daily Started January 2018 ------------------------------------------------------------------------------------------------------------------- Anastrozole counseling: We discussed the risks and benefits of anti-estrogen therapy with aromatase inhibitors. These include but not limited to insomnia, hot flashes, mood changes, vaginal dryness, bone density loss, and weight gain. We strongly believe that the benefits far outweigh the risks. Patient understands these risks and consented to starting treatment. Planned treatment duration is 5 years. Patient is a bone density test which she will get it with her primary care physician.  Lower extremity pain and discomfort: Related to prior chemotherapy. She attributes it to Neulasta. I am very concerned about the amount of ibuprofen that she is taking. I instructed her to use extra strength Tylenol to use less ibuprofen. I'm hopeful that as time goes on her symptoms and the legs were improved. I instructed her to take calcium and vitamin D as well.  RTC in April 2018 for tox check  No orders of the defined types were placed in this encounter.  The patient has a good understanding of the overall plan. she agrees with it. she will call with any  problems that may develop before the next visit here.   Rulon Eisenmenger, MD 04/27/16

## 2016-04-28 ENCOUNTER — Ambulatory Visit
Admission: RE | Admit: 2016-04-28 | Discharge: 2016-04-28 | Disposition: A | Discharge status: Home / Self Care | Payer: PPO | Source: Ambulatory Visit | Attending: Radiation Oncology | Admitting: Radiation Oncology

## 2016-04-28 ENCOUNTER — Encounter: Payer: Self-pay | Admitting: Radiation Oncology

## 2016-04-28 VITALS — BP 152/69 | HR 86 | Temp 98.4°F | Resp 18 | Ht 61.0 in | Wt 216.0 lb

## 2016-04-28 DIAGNOSIS — C50211 Malignant neoplasm of upper-inner quadrant of right female breast: Secondary | ICD-10-CM | POA: Insufficient documentation

## 2016-04-28 DIAGNOSIS — Z510 Encounter for antineoplastic radiation therapy: Secondary | ICD-10-CM | POA: Diagnosis not present

## 2016-04-28 MED ORDER — RADIAPLEXRX EX GEL
Freq: Once | CUTANEOUS | Status: AC
  Administered 2016-04-28: 13:00:00 via TOPICAL

## 2016-04-28 NOTE — Addendum Note (Signed)
Encounter addended by: Malena Edman, RN on: 04/28/2016  1:09 PM<BR>    Actions taken: MAR administration accepted

## 2016-04-28 NOTE — Progress Notes (Signed)
Nyaisha has completed 33 fractions to her right breast.  She reports having pain in nipple area.  She denies an increase in fatigue.  She also reports that she has a cold symptoms that are ongoing..  She is taking Robitussin and Mucinex.  She is using radiaplex, neosporin on peeling areas and hydrogel pads.  The skin on her right breast is dry with hyperpigmentation.  She has small, dry peeling areas under her right arm and breast.  EOT instructions given and one month follow up card given to see Dr. Wynn Maudlin, see education area for documentation. Wt Readings from Last 3 Encounters:  04/28/16 216 lb (98 kg)  04/27/16 218 lb 14.4 oz (99.3 kg)  04/21/16 218 lb 12.8 oz (99.2 kg)  BP (!) 152/69   Pulse 86   Temp 98.4 F (36.9 C) (Oral)   Resp 18   Ht 5\' 1"  (1.549 m)   Wt 216 lb (98 kg)   SpO2 100%   BMI 40.81 kg/m

## 2016-04-28 NOTE — Progress Notes (Signed)
  Radiation Oncology         (336) [phone] ________________________________  Name: Shelley Lawrence MRN: [id number]  Date: 04/28/2016  DOB: 07-24-1940  Weekly Radiation Therapy Management    ICD-9-CM ICD-10-CM   1. Malignant neoplasm of upper-inner quadrant of right female breast, unspecified estrogen receptor status (HCC) 174.2 C50.211 hyaluronate sodium (RADIAPLEXRX) gel    Current Dose: 60.4 Gy     Planned Dose:  62.4 Gy  Narrative . . . . . . . . The patient presents for routine under treatment assessment.                        The patient has completed 33 fractions to her right breast. She reports having pain in the nipple area. She denies an increase in her fatigue. The patient also reports that she has ongoing cold symptoms; she is taking Robitussin and Mucinex. The patient is using radiaplex and neosporin on peeling areas, as well as hydrogel pads.                                  Set-up films were reviewed.                                 The chart was checked. Physical Findings. . .  height is 5\' 1"  (1.549 m) and weight is 216 lb (98 kg). Her oral temperature is 98.4 F (36.9 C). Her blood pressure is 152/69 (abnormal) and her pulse is 86. Her respiration is 18 and oxygen saturation is 100%. . Weight essentially stable.  Lungs are clear to auscultation bilaterally. Heart has regular rate and rhythm. No palpable cervical, supraclavicular, or axillary adenopathy. Abdomen soft, non-tender, normal bowel sounds. Hyperpigmentation changes, peeling along the right breast, no moist desquamation noted. Impression . . . . . . . The patient is tolerating radiation. The patient presents in a wheelchair today. Plan . . . . . . . . . . . . Continue treatment as planned. The patient has one more treatment to go, and then she will follow up in one month. The patient knows to contact me with any questions or concerns she has in the interim.  ________________________________   Blair Promise,  PhD, MD  This document serves as a record of services personally performed by Gery Pray, MD. It was created on his behalf by Maryla Morrow, a trained medical scribe. The creation of this record is based on the scribe's personal observations and the provider's statements to them. This document has been checked and approved by the attending provider.

## 2016-04-28 NOTE — Addendum Note (Signed)
Encounter addended by: Malena Edman, RN on: 04/28/2016  1:08 PM<BR>    Actions taken: Order list changed, Diagnosis association updated

## 2016-04-28 NOTE — Addendum Note (Signed)
Encounter addended by: Malena Edman, RN on: 04/28/2016  1:14 PM<BR>    Actions taken: Patient Education assessment filed

## 2016-04-29 ENCOUNTER — Ambulatory Visit
Admission: RE | Admit: 2016-04-29 | Discharge: 2016-04-29 | Disposition: A | Discharge status: Home / Self Care | Payer: PPO | Source: Ambulatory Visit | Attending: Radiation Oncology | Admitting: Radiation Oncology

## 2016-04-29 ENCOUNTER — Ambulatory Visit: Payer: PPO

## 2016-04-29 ENCOUNTER — Telehealth: Payer: Self-pay | Admitting: *Deleted

## 2016-04-29 DIAGNOSIS — Z510 Encounter for antineoplastic radiation therapy: Secondary | ICD-10-CM | POA: Diagnosis not present

## 2016-04-29 DIAGNOSIS — C50211 Malignant neoplasm of upper-inner quadrant of right female breast: Secondary | ICD-10-CM | POA: Diagnosis not present

## 2016-04-29 NOTE — Telephone Encounter (Signed)
  Oncology Nurse Navigator Documentation  Navigator Location: [facility] (04/29/16 1500)   )Navigator Encounter Type: Treatment (04/29/16 1500)  Relate doing well and without complaints from xrt. Congratulate pt on completing xrt. Denies needs or questions at this time.             Multidisiplinary Clinic Date: 10/23/15 (04/29/16 1500) Multidisiplinary Clinic Type: Breast (04/29/16 1500)   Patient Visit Type: [id number] (04/29/16 1500) Treatment Phase: Final Radiation Tx (04/29/16 1500)                  Acuity: Level 1 (04/29/16 1500)         Time Spent with Patient: 30 (04/29/16 1500)

## 2016-04-30 ENCOUNTER — Ambulatory Visit: Payer: PPO

## 2016-05-01 ENCOUNTER — Ambulatory Visit: Payer: PPO

## 2016-05-04 ENCOUNTER — Ambulatory Visit: Payer: PPO

## 2016-05-07 ENCOUNTER — Encounter: Payer: Self-pay | Admitting: Radiation Oncology

## 2016-05-07 NOTE — Progress Notes (Signed)
  Radiation Oncology         [phone] ________________________________  Name: Shelley Lawrence MRN: [id number]  Date: 05/07/2016  DOB: [birth year]  End of Treatment Note  Diagnosis:   Malignant neoplasm of upper-inner quadrant of right female breast     Indication for treatment:  Curative      Radiation treatment dates:   03/16/16-04/29/16  Site/dose:   1) Right breast/ 50.4 Gy in 28 fractions   2) Right breast boost / 12 Gy in 6 fractions  Beams/energy:  1) 3D / 10X    2) Isodose plan / 10X, 6X  Narrative: The patient tolerated radiation treatment relatively well.  During treatment, patient reported pain in the nipple area, and peeling in the treatment area. She was treated with radiaPlex Neosporin and hydrogel pads  Plan: The patient has completed radiation treatment. The patient will return to radiation oncology clinic for routine followup in one month. I advised them to call or return sooner if they have any questions or concerns related to their recovery or treatment.  -----------------------------------  Blair Promise, PhD, MD  This document serves as a record of services personally performed by Gery Pray, MD. It was created on his behalf by Bethann Humble, a trained medical scribe. The creation of this record is based on the scribe's personal observations and the provider's statements to them. This document has been checked and approved by the attending provider.

## 2016-06-05 DIAGNOSIS — E119 Type 2 diabetes mellitus without complications: Secondary | ICD-10-CM | POA: Diagnosis not present

## 2016-06-05 DIAGNOSIS — H35342 Macular cyst, hole, or pseudohole, left eye: Secondary | ICD-10-CM | POA: Diagnosis not present

## 2016-06-05 DIAGNOSIS — H401132 Primary open-angle glaucoma, bilateral, moderate stage: Secondary | ICD-10-CM | POA: Diagnosis not present

## 2016-06-05 DIAGNOSIS — H04123 Dry eye syndrome of bilateral lacrimal glands: Secondary | ICD-10-CM | POA: Diagnosis not present

## 2016-06-11 ENCOUNTER — Encounter: Payer: Self-pay | Admitting: Oncology

## 2016-06-11 ENCOUNTER — Ambulatory Visit
Admission: RE | Admit: 2016-06-11 | Discharge: 2016-06-11 | Disposition: A | Discharge status: Home / Self Care | Payer: PPO | Source: Ambulatory Visit | Attending: Radiation Oncology | Admitting: Radiation Oncology

## 2016-06-11 DIAGNOSIS — Z7984 Long term (current) use of oral hypoglycemic drugs: Secondary | ICD-10-CM | POA: Diagnosis not present

## 2016-06-11 DIAGNOSIS — Z7982 Long term (current) use of aspirin: Secondary | ICD-10-CM | POA: Insufficient documentation

## 2016-06-11 DIAGNOSIS — Z79899 Other long term (current) drug therapy: Secondary | ICD-10-CM | POA: Diagnosis not present

## 2016-06-11 DIAGNOSIS — C50211 Malignant neoplasm of upper-inner quadrant of right female breast: Secondary | ICD-10-CM | POA: Diagnosis not present

## 2016-06-11 DIAGNOSIS — Z170 Estrogen receptor positive status [ER+]: Secondary | ICD-10-CM | POA: Insufficient documentation

## 2016-06-11 NOTE — Progress Notes (Signed)
Shelley Lawrence is here for follow up after treatment to her right breast.  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  She reports having fatigue.  She is taking Arimidex. The skin on her right breast has hyperpigmentation.  She has a scattered rash on her central chest and she said it from insect bites.  BP 138/85 (BP Location: Left Arm, Patient Position: Sitting)   Pulse 80   Temp 98.3 F (36.8 C) (Oral)   Ht 5\' 1"  (1.549 m)   Wt 212 lb (96.2 kg)   SpO2 100%   BMI 40.06 kg/m    Wt Readings from Last 3 Encounters:  06/11/16 212 lb (96.2 kg)  04/28/16 216 lb (98 kg)  04/27/16 218 lb 14.4 oz (99.3 kg)

## 2016-06-11 NOTE — Progress Notes (Signed)
Radiation Oncology         [phone] ________________________________  Name: Shelley Lawrence MRN: [id number]  Date: 06/11/2016  DOB: 09-Jul-1940  Follow-Up Visit Note  CC: Jani Gravel, MD  Jovita Kussmaul, MD    ICD-9-CM ICD-10-CM   1. 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 The patient states the pain has been present for the past 3 months and is inquiring if she could have a prescription for this. She reports having fatigue. She is taking Arimidex. The nurse notes the skin on her right breast has hyperpigmentation and a scattered rash on her central chest that the patient attributes to insect bites. Denies swelling of her arms.  ALLERGIES:  is allergic to horse-derived products.  Meds: Current Outpatient Prescriptions  Medication Sig Dispense Refill  . anastrozole (ARIMIDEX) 1 MG tablet Take 1 tablet (1 mg total) by mouth daily. 90 tablet 3  . aspirin 81 MG tablet Take 81 mg by mouth daily.    Marland Kitchen atorvastatin (LIPITOR) 40 MG tablet Take 40 mg by mouth daily.    Marland Kitchen b complex vitamins capsule Take 1 capsule by mouth daily.    . brimonidine-timolol (COMBIGAN) 0.2-0.5 % ophthalmic solution Place 1 drop into the left eye every 12 (twelve) hours.    . cycloSPORINE (RESTASIS) 0.05 % ophthalmic emulsion Place 1 drop into both eyes 2 (two) times daily.    Marland Kitchen esomeprazole (NEXIUM) 40 MG capsule Take 1 capsule (40 mg total) by mouth daily before breakfast. 30 capsule 1  . fexofenadine (ALLEGRA) 180 MG tablet Take 180 mg by mouth daily.     . fluticasone (VERAMYST) 27.5 MCG/SPRAY nasal spray Place 2 sprays into the nose daily.    Marland Kitchen ibuprofen (ADVIL,MOTRIN) 400 MG tablet Take 400 mg by mouth every 6 (six) hours as needed.    . latanoprost (XALATAN) 0.005 % ophthalmic solution Place 1 drop into both eyes at bedtime.    Marland Kitchen lisinopril-hydrochlorothiazide (PRINZIDE,ZESTORETIC) 20-12.5 MG per tablet Take 1 tablet by mouth daily.     . metFORMIN (GLUCOPHAGE-XR) 500 MG 24 hr tablet Take 500 mg by mouth daily with breakfast.     . Multiple Vitamins-Minerals (MULTIVITAMIN WITH MINERALS) tablet Take 1 tablet by mouth daily.    . non-metallic deodorant Jethro Poling) MISC Apply 1 application topically daily as needed.    Glory Rosebush VERIO test strip     . potassium chloride (K-DUR) 10 MEQ tablet Take 10 mEq by mouth daily.    Marland Kitchen Specialty Vitamins Products (MAGNESIUM, AMINO ACID CHELATE,) 133 MG tablet Take 1 tablet by mouth. '250mg'$     . bumetanide (BUMEX) 1 MG tablet TAKE ONE TABLET BY MOUTH ONCE DAILY (Patient not taking: Reported on 06/11/2016) 30 tablet 0  . guaiFENesin (MUCINEX) 600 MG 12 hr tablet Take by mouth 2 (two) times daily.    Marland Kitchen guaiFENesin (ROBITUSSIN) 100 MG/5ML liquid Take 200 mg by mouth 3 (three) times daily as needed for cough.    . hyaluronate sodium (RADIAPLEXRX) GEL Apply 1 application topically 2 (two)  times daily.    Marland Kitchen HYDROcodone-acetaminophen (NORCO) 5-325 MG tablet Take 1-2 tablets by mouth every 6 (six) hours as needed for moderate pain. (Patient not taking: Reported on 06/11/2016) 20 tablet 0  . HYDROcodone-acetaminophen (NORCO/VICODIN) 5-325 MG tablet Take 1-2 tablets by mouth every 4 (four) hours as needed for moderate pain or severe pain. (Patient not taking: Reported on 06/11/2016) 40 tablet 0  . lidocaine-prilocaine (EMLA) cream Apply to affected area once (Patient not taking: Reported on 06/11/2016) 30 g 3  . LORazepam (ATIVAN) 0.5 MG tablet Take 1 tablet (0.5 mg total) by mouth every 6 (six) hours as needed (Nausea or vomiting).  (Patient not taking: Reported on 06/11/2016) 30 tablet 0  . ondansetron (ZOFRAN) 8 MG tablet TAKE ONE TABLET BY MOUTH TWICE DAILY AS NEEDED FOR  REFRACTORY  NAUSEA/VOMITING.  START  ON  DAY  3  AFTER  CHEMO (Patient not taking: Reported on 06/11/2016) 30 tablet 0  . oxyCODONE-acetaminophen (ROXICET) 5-325 MG tablet Take 1-2 tablets by mouth every 4 (four) hours as needed. (Patient not taking: Reported on 06/11/2016) 30 tablet 0  . prochlorperazine (COMPAZINE) 10 MG tablet TAKE ONE TABLET BY MOUTH EVERY 6 HOURS AS NEEDED FOR NAUSEA OR VOMITING (Patient not taking: Reported on 06/11/2016) 30 tablet 0  . traMADol (ULTRAM) 50 MG tablet Take 50 mg by mouth every 8 (eight) hours as needed.      No current facility-administered medications for this encounter.     Physical Findings: The patient is in no acute distress. Patient is alert and oriented.  height is '5\' 1"'$  (1.549 m) and weight is 212 lb (96.2 kg). Her oral temperature is 98.3 F (36.8 C). Her blood pressure is 138/85 and her pulse is 80. Her oxygen saturation is 100%.   Presents in a wheelchair. Lungs are clear to auscultation bilaterally. Heart has regular rate and rhythm. No palpable cervical, supraclavicular, or axillary adenopathy. Left breast no palpable mass or nipple discharge. Right breast some mild hyperpigmentation changes, mild edema, no dominant mass appreciated, no nipple discharge or bleeding.  Lab Findings: Lab Results  Component Value Date   WBC 5.2 04/27/2016   HGB 11.0 (L) 04/27/2016   HCT 34.6 (L) 04/27/2016   MCV 76.6 (L) 04/27/2016   PLT 243 04/27/2016    Radiographic Findings: No results found.  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 The patient is healing well from radiation as part of her breast conservation therapy. 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  Plan: I advised the patient to speak with Dr. Lindi Adie about medication for her neuropathy if her symptoms persist and continue follow up with medical oncology regarding adjuvant hormonal therapy. The patient will return to radiation oncology on an PRN basis. ____________________________________ ----------------------------------- Buren Kos, PhD, MD  This document serves as a record of services personally performed by Gery Pray, MD. It was created on his behalf by Darcus Austin, a trained medical scribe. The creation of this record is based on the scribe's personal observations and the provider's statements to them. This document has been checked and approved by the attending provider.

## 2016-06-12 DIAGNOSIS — M1712 Unilateral primary osteoarthritis, left knee: Secondary | ICD-10-CM | POA: Diagnosis not present

## 2016-06-12 DIAGNOSIS — M79604 Pain in right leg: Secondary | ICD-10-CM | POA: Diagnosis not present

## 2016-06-12 DIAGNOSIS — R29898 Other symptoms and signs involving the musculoskeletal system: Secondary | ICD-10-CM | POA: Diagnosis not present

## 2016-06-12 DIAGNOSIS — M170 Bilateral primary osteoarthritis of knee: Secondary | ICD-10-CM | POA: Diagnosis not present

## 2016-06-12 DIAGNOSIS — M79605 Pain in left leg: Secondary | ICD-10-CM | POA: Diagnosis not present

## 2016-06-26 DIAGNOSIS — H26492 Other secondary cataract, left eye: Secondary | ICD-10-CM | POA: Diagnosis not present

## 2016-07-08 ENCOUNTER — Ambulatory Visit (HOSPITAL_BASED_OUTPATIENT_CLINIC_OR_DEPARTMENT_OTHER): Payer: PPO

## 2016-07-08 VITALS — BP 149/66 | HR 87 | Temp 98.3°F | Resp 18

## 2016-07-08 DIAGNOSIS — Z452 Encounter for adjustment and management of vascular access device: Secondary | ICD-10-CM

## 2016-07-08 DIAGNOSIS — C50211 Malignant neoplasm of upper-inner quadrant of right female breast: Secondary | ICD-10-CM

## 2016-07-08 DIAGNOSIS — Z95828 Presence of other vascular implants and grafts: Secondary | ICD-10-CM

## 2016-07-08 MED ORDER — HEPARIN SOD (PORK) LOCK FLUSH 100 UNIT/ML IV SOLN
500.0000 [IU] | Freq: Once | INTRAVENOUS | Status: AC | PRN
  Administered 2016-07-08: 500 [IU] via INTRAVENOUS
  Filled 2016-07-08: qty 5

## 2016-07-08 MED ORDER — SODIUM CHLORIDE 0.9 % IJ SOLN
10.0000 mL | INTRAMUSCULAR | Status: DC | PRN
  Administered 2016-07-08: 10 mL via INTRAVENOUS
  Filled 2016-07-08: qty 10

## 2016-08-19 DIAGNOSIS — M810 Age-related osteoporosis without current pathological fracture: Secondary | ICD-10-CM | POA: Diagnosis not present

## 2016-08-24 DIAGNOSIS — C50211 Malignant neoplasm of upper-inner quadrant of right female breast: Secondary | ICD-10-CM | POA: Diagnosis not present

## 2016-08-27 ENCOUNTER — Telehealth: Payer: Self-pay | Admitting: Hematology and Oncology

## 2016-08-27 NOTE — Telephone Encounter (Signed)
lvm to inform pt of [facility] appt in June per LOS

## 2016-09-06 IMAGING — MG MM RT PLC BREAST LOC 1ST LESION MAMMO
1 series · 1 of 1 positions shown · non-contrast
Comparison: Previous exams.

CLINICAL DATA: 74-year-old female for wire localization of biopsy
site demonstrating invasive ductal carcinoma

EXAM:
NEEDLE LOCALIZATION OF THE RIGHT BREAST WITH MAMMO GUIDANCE

[R ML]
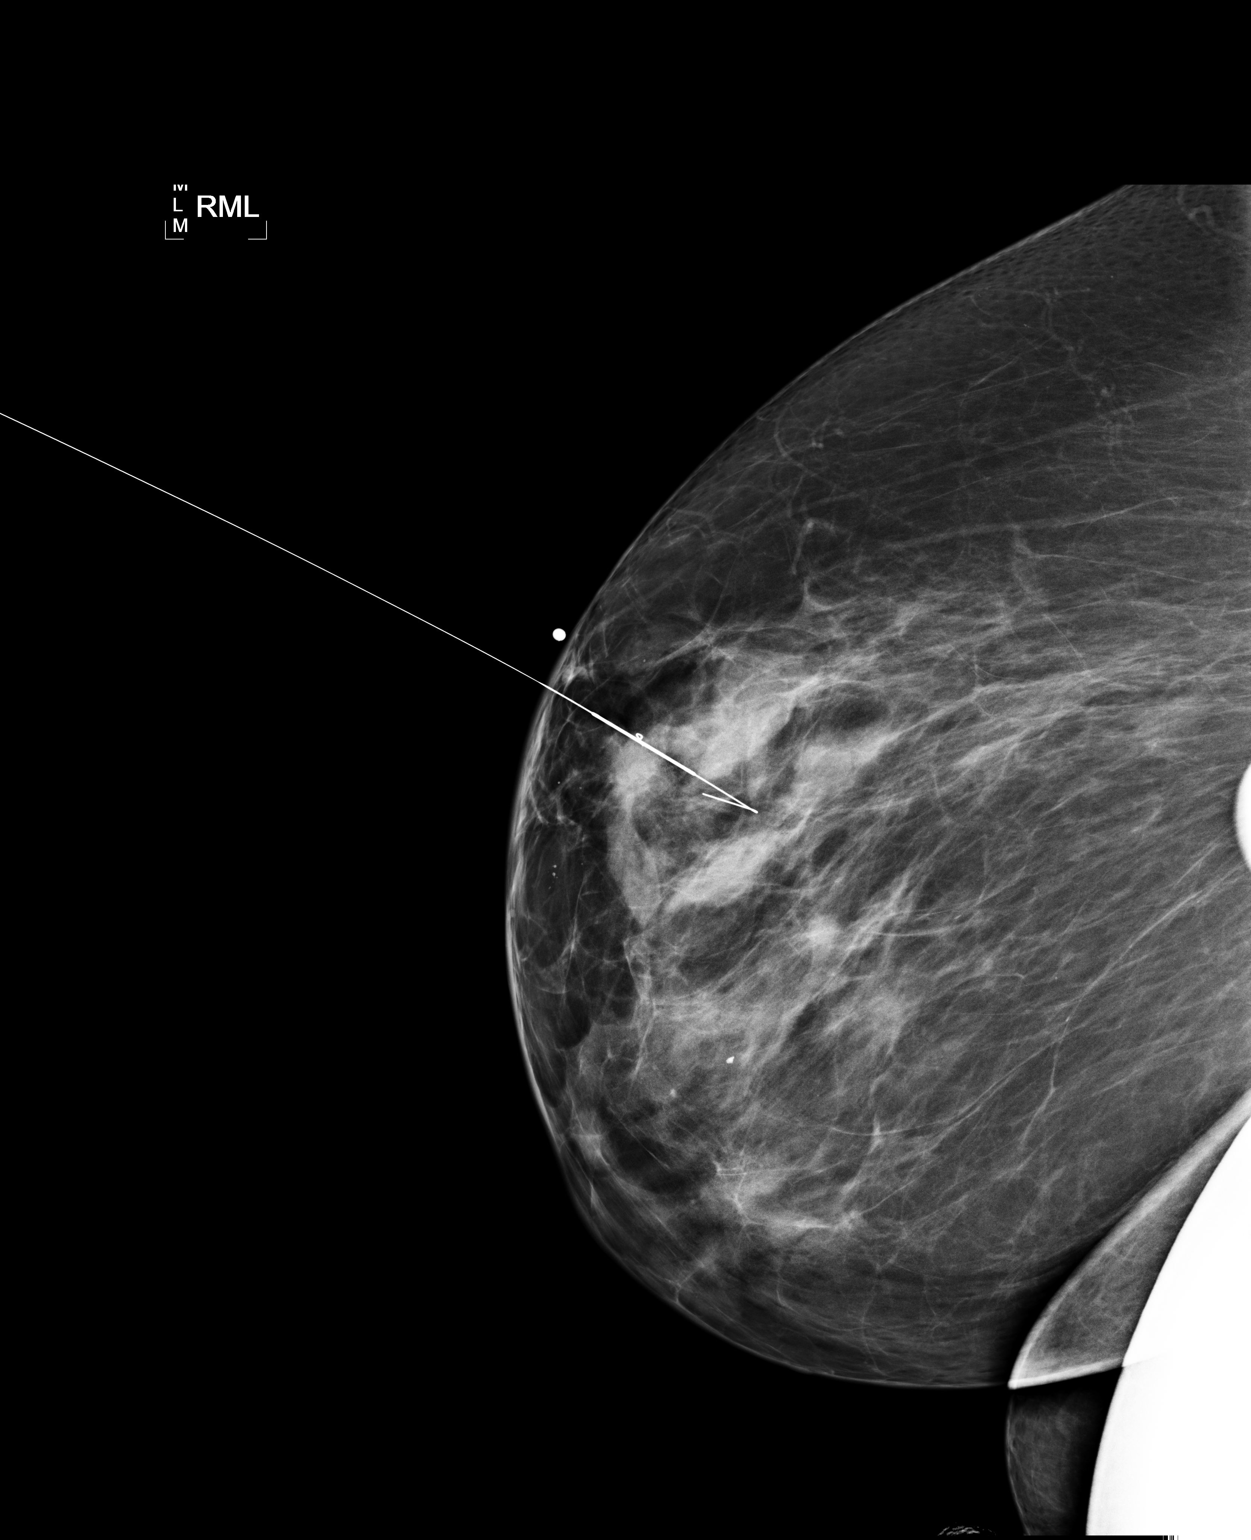

[1 of 1 positions shown; findings below may reference images not displayed]

FINDINGS: Patient presents for needle localization prior to right lumpectomy.
I met with the patient and we discussed the procedure of needle
localization including benefits and alternatives. We discussed the
high likelihood of a successful procedure. We discussed the risks of
the procedure, including infection, bleeding, tissue injury, and
further surgery. Informed, written consent was given. The usual
time-out protocol was performed immediately prior to the procedure.

Using mammographic guidance, sterile technique, 1% lidocaine and a 7
cm modified Kopans needle, the ribbon shaped biopsy marker and mass
within the upper, inner right breast were localized using a superior
to inferior approach. The images were marked for Dr. Orlandiito.
IMPRESSION: Needle localization of the right breast. No apparent complications.

## 2016-09-21 ENCOUNTER — Ambulatory Visit (HOSPITAL_BASED_OUTPATIENT_CLINIC_OR_DEPARTMENT_OTHER): Payer: PPO | Admitting: Hematology and Oncology

## 2016-09-21 ENCOUNTER — Encounter: Payer: Self-pay | Admitting: Hematology and Oncology

## 2016-09-21 ENCOUNTER — Ambulatory Visit (HOSPITAL_BASED_OUTPATIENT_CLINIC_OR_DEPARTMENT_OTHER): Payer: PPO

## 2016-09-21 ENCOUNTER — Encounter: Payer: Self-pay | Admitting: Pharmacist

## 2016-09-21 VITALS — BP 146/71 | HR 69 | Temp 98.0°F | Resp 18 | Ht 61.0 in | Wt 205.5 lb

## 2016-09-21 DIAGNOSIS — C50211 Malignant neoplasm of upper-inner quadrant of right female breast: Secondary | ICD-10-CM

## 2016-09-21 DIAGNOSIS — G629 Polyneuropathy, unspecified: Secondary | ICD-10-CM

## 2016-09-21 DIAGNOSIS — Z95828 Presence of other vascular implants and grafts: Secondary | ICD-10-CM

## 2016-09-21 DIAGNOSIS — Z170 Estrogen receptor positive status [ER+]: Secondary | ICD-10-CM

## 2016-09-21 MED ORDER — SODIUM CHLORIDE 0.9 % IJ SOLN
10.0000 mL | INTRAMUSCULAR | Status: DC | PRN
  Administered 2016-09-21: 10 mL via INTRAVENOUS
  Filled 2016-09-21: qty 10

## 2016-09-21 MED ORDER — HEPARIN SOD (PORK) LOCK FLUSH 100 UNIT/ML IV SOLN
500.0000 [IU] | Freq: Once | INTRAVENOUS | Status: AC | PRN
  Administered 2016-09-21: 500 [IU] via INTRAVENOUS
  Filled 2016-09-21: qty 5

## 2016-09-21 NOTE — Progress Notes (Signed)
Consent documentation  Study code: [id number]  Met with patient following the patients provider visit on 09/21/16 at 11:00. Provided an overview of the "Pharmacogenetic analysis of toxicities related to administration of taxanes in breast cancer patients" study. Patient was alone for this visit. Consent form was reviewed with the patient (reviewed the study purpose, patient's role, possible side effects, cost, risk, information protection) All of the patient's questions were answered and she agreed to participate in the study. A signed copy of the consent form was given to the patient. All eligibility criteria have been met and patient has been enrolled in the study.  Study sample was collected via buccal swab after consent was obtained. Patient was informed that the sample would be sent to the lab for processing after samples were collected from 25 patients and that it would take approximately 2 weeks for the lab to process that sample.   Met with the patient for 15 minutes.  Darl Pikes, PharmD, [place] Clinical Pharmacist- Oncology Pharmacy Resident

## 2016-09-21 NOTE — Patient Instructions (Signed)
Implanted Port Home Guide An implanted port is a type of central line that is placed under the skin. Central lines are used to provide IV access when treatment or nutrition needs to be given through a person's veins. Implanted ports are used for long-term IV access. An implanted port may be placed because:  You need IV medicine that would be irritating to the small veins in your hands or arms.  You need long-term IV medicines, such as antibiotics.  You need IV nutrition for a long period.  You need frequent blood draws for lab tests.  You need dialysis.  Implanted ports are usually placed in the chest area, but they can also be placed in the upper arm, the abdomen, or the leg. An implanted port has two main parts:  Reservoir. The reservoir is round and will appear as a small, raised area under your skin. The reservoir is the part where a needle is inserted to give medicines or draw blood.  Catheter. The catheter is a thin, flexible tube that extends from the reservoir. The catheter is placed into a large vein. Medicine that is inserted into the reservoir goes into the catheter and then into the vein.  How will I care for my incision site? Do not get the incision site wet. Bathe or shower as directed by your health care provider. How is my port accessed? Special steps must be taken to access the port:  Before the port is accessed, a numbing cream can be placed on the skin. This helps numb the skin over the port site.  Your health care provider uses a sterile technique to access the port. ? Your health care provider must put on a mask and sterile gloves. ? The skin over your port is cleaned carefully with an antiseptic and allowed to dry. ? The port is gently pinched between sterile gloves, and a needle is inserted into the port.  Only "non-coring" port needles should be used to access the port. Once the port is accessed, a blood return should be checked. This helps ensure that the port  is in the vein and is not clogged.  If your port needs to remain accessed for a constant infusion, a clear (transparent) bandage will be placed over the needle site. The bandage and needle will need to be changed every week, or as directed by your health care provider.  Keep the bandage covering the needle clean and dry. Do not get it wet. Follow your health care provider's instructions on how to take a shower or bath while the port is accessed.  If your port does not need to stay accessed, no bandage is needed over the port.  What is flushing? Flushing helps keep the port from getting clogged. Follow your health care provider's instructions on how and when to flush the port. Ports are usually flushed with saline solution or a medicine called heparin. The need for flushing will depend on how the port is used.  If the port is used for intermittent medicines or blood draws, the port will need to be flushed: ? After medicines have been given. ? After blood has been drawn. ? As part of routine maintenance.  If a constant infusion is running, the port may not need to be flushed.  How long will my port stay implanted? The port can stay in for as long as your health care provider thinks it is needed. When it is time for the port to come out, surgery will be   done to remove it. The procedure is similar to the one performed when the port was put in. When should I seek immediate medical care? When you have an implanted port, you should seek immediate medical care if:  You notice a bad smell coming from the incision site.  You have swelling, redness, or drainage at the incision site.  You have more swelling or pain at the port site or the surrounding area.  You have a fever that is not controlled with medicine.  This information is not intended to replace advice given to you by your health care provider. Make sure you discuss any questions you have with your health care provider. Document  Released: 05/25/2005 Document Revised: 10/31/2015 Document Reviewed: 01/30/2013 Elsevier Interactive Patient Education  2017 [employer]  

## 2016-09-21 NOTE — Assessment & Plan Note (Signed)
Right lumpectomy 10/30/2015: IDC grade 2, 1.4 cm, IG DCIS, margins negative, 0/2 lymph nodes negative, ER 100%, PR 0%, HER-2 negative, Ki-67 60 %, T1 cN0 stage IA Oncotype DX score 30, 20% risk of recurrence  Treatment plan: 1. Adjuvant chemotherapy With Taxotere and Cytoxan 4 started 11/25/2015 completed 01/27/2016 2. Followed by adjuvant radiation therapy completed 03/24/16 3. Followed by adjuvant antiestrogen therapy with Anastrozole 1 mg daily Started January 2018 ------------------------------------------------------------------------------------------------------------------- Anastrozole toxicities:  Lower extremity pain: Return to clinic in 6 months for follow-up

## 2016-09-21 NOTE — Progress Notes (Signed)
Patient Care Team: Jani Gravel, MD as PCP - General (Internal Medicine) Autumn Messing III, MD as Consulting Physician (General Surgery) Nicholas Lose, MD as Consulting Physician (Oncology) Gery Pray, MD as Consulting Physician (Radiation Oncology) Sylvan Cheese, NP as Nurse Practitioner (Hematology and Oncology)  DIAGNOSIS:  Encounter Diagnoses  Name Primary?  . Malignant neoplasm of upper-inner quadrant of right breast in female, estrogen receptor positive ([place])   . Neuropathy Yes    SUMMARY OF ONCOLOGIC HISTORY:   Breast cancer of upper-inner quadrant of right female breast ([place])   10/14/2015 Initial Diagnosis    Screening detected Right breast mass at 1:00:1.1 x 0.8 x 0.7 cm,grade 2 IDC plus DCIS, ER 100 present, PR 0%, HER-2 negative ratio 1.64, Ki-67 60%, T1cN0 stage IA      10/30/2015 Surgery    Right lumpectomy: IDC grade 2, 1.4 cm, IG DCIS, margins negative, 0/2 lymph nodes negative, ER 100%, PR 0%, HER-2 negative, Ki-67 60 %, T1 cN0 stage IA, Oncotype DX score 30, 20% risk of recurrence      11/25/2015 - 01/27/2016 Chemotherapy    Adjuvant chemotherapy with Taxotere and Cytoxan every 3 weeks 4 cycles      03/16/2016 - 04/24/2016 Radiation Therapy     Adj XRT      06/09/2016 -  Anti-estrogen oral therapy    Anastrozole 1 mg by mouth daily       CHIEF COMPLIANT: Follow-up on anastrozole therapy  INTERVAL HISTORY: Shelley Lawrence is a 76 year old with above-mentioned history of right breast cancer treated with lumpectomy and adjuvant chemotherapy with Taxotere and Cytoxan. 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 These symptoms have improved slowly over time. However she still takes lots of pain medication. She does not think tramadol is helping her. She does not want to take narcotics. She takes over-the-counter Motrin which appears to be helping her symptoms. However she takes up to 6 or 8 tablets daily. She is using  a walker to get around. She could not do live strong program because of her inability to participate in it actively  REVIEW OF SYSTEMS:   Constitutional: Denies fevers, chills or abnormal weight loss Eyes: Denies blurriness of vision Ears, nose, mouth, throat, and face: Denies mucositis or sore throat Respiratory: Denies cough, dyspnea or wheezes Cardiovascular: Denies palpitation, chest discomfort Gastrointestinal:  Denies nausea, heartburn or change in bowel habits Skin: Denies abnormal skin rashes Lymphatics: Denies new lymphadenopathy or easy bruising Neurological: Neuropathy in the feet, uses walker for  walking Behavioral/Psych: Mood is stable, no new changes  Extremities: No lower extremity edema Breast:  denies any pain or lumps or nodules in either breasts All other systems were reviewed with the patient and are negative.  I have reviewed the past medical history, past surgical history, social history and family history with the patient and they are unchanged from previous note.  ALLERGIES:  is allergic to horse-derived products.  MEDICATIONS:  Current Outpatient Prescriptions  Medication Sig Dispense Refill  . anastrozole (ARIMIDEX) 1 MG tablet Take 1 tablet (1 mg total) by mouth daily. 90 tablet 3  . aspirin 81 MG tablet Take 81 mg by mouth daily.    Marland Kitchen atorvastatin (LIPITOR) 40 MG tablet Take 40 mg by mouth daily.    Marland Kitchen b complex vitamins capsule Take 1 capsule by mouth daily.    . brimonidine-timolol (COMBIGAN) 0.2-0.5 % ophthalmic solution Place 1 drop into the left eye every 12 (twelve) hours.    Marland Kitchen  bumetanide (BUMEX) 1 MG tablet TAKE ONE TABLET BY MOUTH ONCE DAILY (Patient not taking: Reported on 06/11/2016) 30 tablet 0  . cycloSPORINE (RESTASIS) 0.05 % ophthalmic emulsion Place 1 drop into both eyes 2 (two) times daily.    Marland Kitchen esomeprazole (NEXIUM) 40 MG capsule Take 1 capsule (40 mg total) by mouth daily before breakfast. 30 capsule 1  . fexofenadine (ALLEGRA) 180 MG tablet  Take 180 mg by mouth daily.    . fluticasone (VERAMYST) 27.5 MCG/SPRAY nasal spray Place 2 sprays into the nose daily.    Marland Kitchen guaiFENesin (MUCINEX) 600 MG 12 hr tablet Take by mouth 2 (two) times daily.    Marland Kitchen guaiFENesin (ROBITUSSIN) 100 MG/5ML liquid Take 200 mg by mouth 3 (three) times daily as needed for cough.    . hyaluronate sodium (RADIAPLEXRX) GEL Apply 1 application topically 2 (two) times daily.    Marland Kitchen HYDROcodone-acetaminophen (NORCO) 5-325 MG tablet Take 1-2 tablets by mouth every 6 (six) hours as needed for moderate pain. (Patient not taking: Reported on 06/11/2016) 20 tablet 0  . HYDROcodone-acetaminophen (NORCO/VICODIN) 5-325 MG tablet Take 1-2 tablets by mouth every 4 (four) hours as needed for moderate pain or severe pain. (Patient not taking: Reported on 06/11/2016) 40 tablet 0  . ibuprofen (ADVIL,MOTRIN) 400 MG tablet Take 400 mg by mouth every 6 (six) hours as needed.    . latanoprost (XALATAN) 0.005 % ophthalmic solution Place 1 drop into both eyes at bedtime.    . lidocaine-prilocaine (EMLA) cream Apply to affected area once (Patient not taking: Reported on 06/11/2016) 30 g 3  . lisinopril-hydrochlorothiazide (PRINZIDE,ZESTORETIC) 20-12.5 MG per tablet Take 1 tablet by mouth daily.     Marland Kitchen LORazepam (ATIVAN) 0.5 MG tablet Take 1 tablet (0.5 mg total) by mouth every 6 (six) hours as needed (Nausea or vomiting). (Patient not taking: Reported on 06/11/2016) 30 tablet 0  . metFORMIN (GLUCOPHAGE-XR) 500 MG 24 hr tablet Take 500 mg by mouth daily with breakfast.     . Multiple Vitamins-Minerals (MULTIVITAMIN WITH MINERALS) tablet Take 1 tablet by mouth daily.    . non-metallic deodorant Jethro Poling) MISC Apply 1 application topically daily as needed.    . ondansetron (ZOFRAN) 8 MG tablet TAKE ONE TABLET BY MOUTH TWICE DAILY AS NEEDED FOR  REFRACTORY  NAUSEA/VOMITING.  START  ON  DAY  3  AFTER  CHEMO (Patient not taking: Reported on 06/11/2016) 30 tablet 0  . ONETOUCH VERIO test strip     .  oxyCODONE-acetaminophen (ROXICET) 5-325 MG tablet Take 1-2 tablets by mouth every 4 (four) hours as needed. (Patient not taking: Reported on 06/11/2016) 30 tablet 0  . potassium chloride (K-DUR) 10 MEQ tablet Take 10 mEq by mouth daily.    . prochlorperazine (COMPAZINE) 10 MG tablet TAKE ONE TABLET BY MOUTH EVERY 6 HOURS AS NEEDED FOR NAUSEA OR VOMITING (Patient not taking: Reported on 06/11/2016) 30 tablet 0  . Specialty Vitamins Products (MAGNESIUM, AMINO ACID CHELATE,) 133 MG tablet Take 1 tablet by mouth. '250mg'$     . traMADol (ULTRAM) 50 MG tablet Take 50 mg by mouth every 8 (eight) hours as needed.      No current facility-administered medications for this visit.     PHYSICAL EXAMINATION: ECOG PERFORMANCE STATUS: 2 - Symptomatic, <50% confined to bed  Vitals:   09/21/16 1111  BP: (!) 146/71  Pulse: 69  Resp: 18  Temp: 98 F (36.7 C)   Filed Weights   09/21/16 1111  Weight: 205 lb 8 oz (93.2  kg)    GENERAL:alert, no distress and comfortable SKIN: skin color, texture, turgor are normal, no rashes or significant lesions EYES: normal, Conjunctiva are pink and non-injected, sclera clear OROPHARYNX:no exudate, no erythema and lips, buccal mucosa, and tongue normal  NECK: supple, thyroid normal size, non-tender, without nodularity LYMPH:  no palpable lymphadenopathy in the cervical, axillary or inguinal LUNGS: clear to auscultation and percussion with normal breathing effort HEART: regular rate & rhythm and no murmurs and no lower extremity edema ABDOMEN:abdomen soft, non-tender and normal bowel sounds MUSCULOSKELETAL:no cyanosis of digits and no clubbing  NEURO: alert & oriented x 3 with fluent speech, neuropathy in the feet grade 2-3 EXTREMITIES: No lower extremity edema LABORATORY DATA:  I have reviewed the data as listed   Chemistry      Component Value Date/Time   NA 140 04/27/2016 1105   K 3.9 04/27/2016 1105   CO2 26 04/27/2016 1105   BUN 12.3 04/27/2016 1105    CREATININE 0.6 04/27/2016 1105      Component Value Date/Time   CALCIUM 9.5 04/27/2016 1105   ALKPHOS 92 04/27/2016 1105   AST 21 04/27/2016 1105   ALT 20 04/27/2016 1105   BILITOT 0.25 04/27/2016 1105       Lab Results  Component Value Date   WBC 5.2 04/27/2016   HGB 11.0 (L) 04/27/2016   HCT 34.6 (L) 04/27/2016   MCV 76.6 (L) 04/27/2016   PLT 243 04/27/2016   NEUTROABS 3.6 04/27/2016    ASSESSMENT & PLAN:  Breast cancer of upper-inner quadrant of right female breast ([place]) Right lumpectomy 10/30/2015: IDC grade 2, 1.4 cm, IG DCIS, margins negative, 0/2 lymph nodes negative, ER 100%, PR 0%, HER-2 negative, Ki-67 60 %, T1 cN0 stage IA Oncotype DX score 30, 20% risk of recurrence  Treatment plan: 1. Adjuvant chemotherapy With Taxotere and Cytoxan 4 started 11/25/2015 completed 01/27/2016 2. Followed by adjuvant radiation therapy completed 03/24/16 3. Followed by adjuvant antiestrogen therapy with Anastrozole 1 mg daily Started January 2018 ------------------------------------------------------------------------------------------------------------------- Anastrozole toxicities: Tolerating it fairly well. She has very occasional hot flash. 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 She was prescribed Lyrica but she did not take it.  Patient agreed to follow-up participate in pharmacogenomic study regarding neuropathy. Return to clinic in 6 months for follow-up   I spent 25 minutes talking to the patient of which more than half was spent in counseling and coordination of care.  Orders Placed This Encounter  Procedures  . Ambulatory referral to Physical Therapy    Referral Priority:   Routine    Referral Type:   Physical Medicine    Referral Reason:   Specialty Services Required    Requested Specialty:   Physical Therapy    Number of Visits Requested:   1   The patient has a good understanding of the overall plan. she agrees with it.  she will call with any problems that may develop before the next visit here.   Rulon Eisenmenger, MD 09/21/16

## 2016-10-30 DIAGNOSIS — C50911 Malignant neoplasm of unspecified site of right female breast: Secondary | ICD-10-CM | POA: Diagnosis not present

## 2016-11-11 NOTE — Progress Notes (Signed)
CLINIC:  Survivorship   REASON FOR VISIT:  Routine follow-up post-treatment for a recent history of breast cancer.  BRIEF ONCOLOGIC HISTORY:    Breast cancer of upper-inner quadrant of right female breast ([place])   10/14/2015 Initial Diagnosis    Screening detected Right breast mass at 1:00:1.1 x 0.8 x 0.7 cm,grade 2 IDC plus DCIS, ER 100 present, PR 0%, HER-2 negative ratio 1.64, Ki-67 60%, T1cN0 stage IA      10/30/2015 Surgery    Right lumpectomy: IDC grade 2, 1.4 cm, IG DCIS, margins negative, 0/2 lymph nodes negative, ER 100%, PR 0%, HER-2 negative, Ki-67 60 %, T1 cN0 stage IA, Oncotype DX score 30, 20% risk of recurrence      11/25/2015 - 01/27/2016 Chemotherapy    Adjuvant chemotherapy with Taxotere and Cytoxan every 3 weeks 4 cycles      03/16/2016 - 04/29/2016 Radiation Therapy     Adj XRT      06/09/2016 -  Anti-estrogen oral therapy    Anastrozole 1 mg by mouth daily       INTERVAL HISTORY:  Shelley Lawrence presents to the [facility] today for our initial meeting to review her survivorship care plan detailing her treatment course for breast cancer, as well as monitoring long-term side effects of that treatment, education regarding health maintenance, screening, and overall wellness and health promotion.     Overall, Shelley Lawrence reports feeling quite well.  She does have some numbness in her calves that is chronic in nauture.  She does have some mild urge incontinence that is manageable.  She is taking anastrozole daily and is tolerating it well with the exception of some mild hot flashes.  She does have a mild rash underneath her right breast that she wants me to look at.  It started in April, 2018.    REVIEW OF SYSTEMS:  Review of Systems  Constitutional: Negative for appetite change, chills, diaphoresis, fatigue, fever and unexpected weight change.  HENT:   Negative for hearing loss and lump/mass.   Eyes: Negative for eye problems and icterus.  Respiratory:  Negative for chest tightness, cough and shortness of breath.   Cardiovascular: Negative for chest pain, leg swelling and palpitations.  Gastrointestinal: Negative for abdominal distention and abdominal pain.  Endocrine: Positive for hot flashes.  Musculoskeletal: Positive for arthralgias (chronic knee arthritis).  Skin: Negative for itching and rash.  Neurological: Negative for dizziness, extremity weakness and headaches.  Hematological: Negative for adenopathy. Does not bruise/bleed easily.  Psychiatric/Behavioral: Negative for depression. The patient is not nervous/anxious.    Breast: Denies any new nodularity, masses, tenderness, nipple changes, or nipple discharge.    ONCOLOGY TREATMENT TEAM:  1. Surgeon:  Dr. Marlou Starks at [facility] 2. Medical Oncologist: Dr.Gudena 3. Radiation Oncologist: Dr. Sondra Come    PAST MEDICAL/SURGICAL HISTORY:  Past Medical History:  Diagnosis Date  . Allergy   . Anemia   . Arthritis    knees  . Broken foot   . Carpal tunnel syndrome   . Diabetes mellitus without complication ([place])   . GERD (gastroesophageal reflux disease)   . History of radiation therapy 03/16/16-04/29/16   Right breast/ 50.4 Gy in 28 fractions and right breast boost / 12 Gy in 6 fractions.  . Hot flashes   . Hypertension    Past Surgical History:  Procedure Laterality Date  . ABDOMINAL HYSTERECTOMY    . BACK SURGERY    . BREAST LUMPECTOMY WITH NEEDLE LOCALIZATION AND AXILLARY SENTINEL LYMPH NODE  BX Right 10/30/2015   Procedure: BREAST LUMPECTOMY WITH NEEDLE LOCALIZATION AND AXILLARY SENTINEL LYMPH NODE BX;  Surgeon: Autumn Messing III, MD;  Location: [facility];  Service: General;  Laterality: Right;  . CARPAL TUNNEL RELEASE Right   . KNEE SURGERY    . OVARY SURGERY     removal of ovarian tumor  . PORTACATH PLACEMENT Left 11/21/2015   Procedure: INSERTION PORT-A-CATH;  Surgeon: Autumn Messing III, MD;  Location: [facility];  Service: General;   Laterality: Left;  . TONSILECTOMY/ADENOIDECTOMY WITH MYRINGOTOMY    . TUBAL LIGATION       ALLERGIES:  Allergies  Allergen Reactions  . Horse-Derived Products     REACTION: swelling     CURRENT MEDICATIONS:  Outpatient Encounter Prescriptions as of 11/12/2016  Medication Sig Note  . anastrozole (ARIMIDEX) 1 MG tablet Take 1 tablet (1 mg total) by mouth daily.   Marland Kitchen aspirin 81 MG tablet Take 81 mg by mouth daily.   Marland Kitchen atorvastatin (LIPITOR) 40 MG tablet Take 40 mg by mouth daily.   Marland Kitchen b complex vitamins capsule Take 1 capsule by mouth daily.   . brimonidine-timolol (COMBIGAN) 0.2-0.5 % ophthalmic solution Place 1 drop into the left eye every 12 (twelve) hours.   . bumetanide (BUMEX) 1 MG tablet TAKE ONE TABLET BY MOUTH ONCE DAILY (Patient not taking: Reported on 06/11/2016)   . cycloSPORINE (RESTASIS) 0.05 % ophthalmic emulsion Place 1 drop into both eyes 2 (two) times daily.   Marland Kitchen esomeprazole (NEXIUM) 40 MG capsule Take 1 capsule (40 mg total) by mouth daily before breakfast.   . fexofenadine (ALLEGRA) 180 MG tablet Take 180 mg by mouth daily.   . fluticasone (VERAMYST) 27.5 MCG/SPRAY nasal spray Place 2 sprays into the nose daily.   Marland Kitchen guaiFENesin (MUCINEX) 600 MG 12 hr tablet Take by mouth 2 (two) times daily.   Marland Kitchen guaiFENesin (ROBITUSSIN) 100 MG/5ML liquid Take 200 mg by mouth 3 (three) times daily as needed for cough.   . hyaluronate sodium (RADIAPLEXRX) GEL Apply 1 application topically 2 (two) times daily. 03/24/2016: Refill given 03/24/16  . HYDROcodone-acetaminophen (NORCO) 5-325 MG tablet Take 1-2 tablets by mouth every 6 (six) hours as needed for moderate pain. (Patient not taking: Reported on 06/11/2016)   . HYDROcodone-acetaminophen (NORCO/VICODIN) 5-325 MG tablet Take 1-2 tablets by mouth every 4 (four) hours as needed for moderate pain or severe pain. (Patient not taking: Reported on 06/11/2016)   . ibuprofen (ADVIL,MOTRIN) 400 MG tablet Take 400 mg by mouth every 6 (six) hours as  needed.   . latanoprost (XALATAN) 0.005 % ophthalmic solution Place 1 drop into both eyes at bedtime.   . lidocaine-prilocaine (EMLA) cream Apply to affected area once (Patient not taking: Reported on 06/11/2016)   . lisinopril-hydrochlorothiazide (PRINZIDE,ZESTORETIC) 20-12.5 MG per tablet Take 1 tablet by mouth daily.    Marland Kitchen LORazepam (ATIVAN) 0.5 MG tablet Take 1 tablet (0.5 mg total) by mouth every 6 (six) hours as needed (Nausea or vomiting). (Patient not taking: Reported on 06/11/2016)   . metFORMIN (GLUCOPHAGE-XR) 500 MG 24 hr tablet Take 500 mg by mouth daily with breakfast.    . Multiple Vitamins-Minerals (MULTIVITAMIN WITH MINERALS) tablet Take 1 tablet by mouth daily.   . non-metallic deodorant Jethro Poling) MISC Apply 1 application topically daily as needed.   . ondansetron (ZOFRAN) 8 MG tablet TAKE ONE TABLET BY MOUTH TWICE DAILY AS NEEDED FOR  REFRACTORY  NAUSEA/VOMITING.  START  ON  DAY  3  AFTER  CHEMO (Patient not taking: Reported on 06/11/2016)   . ONETOUCH VERIO test strip  10/23/2015: Received from: External Pharmacy  . oxyCODONE-acetaminophen (ROXICET) 5-325 MG tablet Take 1-2 tablets by mouth every 4 (four) hours as needed. (Patient not taking: Reported on 06/11/2016)   . potassium chloride (K-DUR) 10 MEQ tablet Take 10 mEq by mouth daily.   . prochlorperazine (COMPAZINE) 10 MG tablet TAKE ONE TABLET BY MOUTH EVERY 6 HOURS AS NEEDED FOR NAUSEA OR VOMITING (Patient not taking: Reported on 06/11/2016)   . Specialty Vitamins Products (MAGNESIUM, AMINO ACID CHELATE,) 133 MG tablet Take 1 tablet by mouth. 260m   . traMADol (ULTRAM) 50 MG tablet Take 50 mg by mouth every 8 (eight) hours as needed.     No facility-administered encounter medications on file as of 11/12/2016.      ONCOLOGIC FAMILY HISTORY:  Non contributory   GENETIC COUNSELING/TESTING: Not indicated  SOCIAL HISTORY:  PARIELLE EBERis widowed [place]  She has no children.  She does  have a brother who lives in [place]  Shelley Lawrence currently retired.  She denies any current or history of tobacco, alcohol, or illicit drug use.     PHYSICAL EXAMINATION:  Vital Signs:   Vitals:   11/12/16 0952  BP: (!) 153/81  Pulse: 73  Resp: 18  Temp: 97.5 F (36.4 C)   Filed Weights   11/12/16 0952  Weight: 205 lb (93 kg)   General: Well-nourished, well-appearing female in no acute distress.  She is unaccompanied today.   HEENT: Head is normocephalic.  Pupils equal and reactive to light. Conjunctivae clear without exudate.  Sclerae anicteric. Oral mucosa is pink, moist.  Oropharynx is pink without lesions or erythema.  Lymph: No cervical, supraclavicular, or infraclavicular lymphadenopathy noted on palpation.  Cardiovascular: Regular rate and rhythm..Marland KitchenRespiratory: Clear to auscultation bilaterally. Chest expansion symmetric; breathing non-labored.  GI: Abdomen soft and round; non-tender, non-distended. Bowel sounds normoactive.  GU: Deferred.  Neuro: No focal deficits. Steady gait.  Psych: Mood and affect normal and appropriate for situation.  Extremities: No edema. Skin: Warm and dry.  LABORATORY DATA:  None for this visit.  DIAGNOSTIC IMAGING:  None for this visit.      ASSESSMENT AND PLAN:  Ms.. MMesmeris a pleasant 76y.o. female with Stage IA right breast invasive ductal carcinoma, ER+/PR-/HER2-, diagnosed in 10/2015, treated with lumpectomy, adjuvant chemotherapy, adjuvant radiation therapy, and anti-estrogen therapy with Anastrozole beginning in 06/2016.  She presents to the Survivorship Clinic for our initial meeting and routine follow-up post-completion of treatment for breast cancer.    1. Stage IA right breast cancer:  Ms. MFreinis continuing to recover from definitive treatment for breast cancer. She will follow-up with her medical oncologist, Dr. GLindi Adiein 03/2017 with history and physical exam per surveillance protocol.  She will continue her  anti-estrogen therapy with Anastrozole. Thus far, she is tolerating the Anastrozole well, with minimal side effects. She was instructed to make Dr. GLindi Adieor myself aware if she begins to experience any worsening side effects of the medication and I could see her back in clinic to help manage those side effects, as needed. Today, a comprehensive survivorship care plan and treatment summary was reviewed with the patient today detailing her breast cancer diagnosis, treatment course, potential late/long-term effects of treatment, appropriate follow-up care with recommendations for the future, and patient education resources.  A copy of this summary, along with a  letter will be sent to the patient's primary care provider via mail/fax/In Basket message after today's visit.    2. Bone health:  Given Shelley Lawrence's age/history of breast cancer and her current treatment regimen including anti-estrogen therapy with Anastrozole, she is at risk for bone demineralization.  Her last DEXA scan was recently, however she cannot recall the results and I cannot find a record of them.  She will look into giving [place] another copy of these.  In the meantime, she was encouraged to increase her consumption of foods rich in calcium, as well as increase her weight-bearing activities.  She was given education on specific activities to promote bone health.  3. Cancer screening:  Due to Shelley Lawrence's history and her age, she should receive screening for skin cancers, colon cancer.  The information and recommendations are listed on the patient's comprehensive care plan/treatment summary and were reviewed in detail with the patient.    4. Health maintenance and wellness promotion: Shelley Lawrence was encouraged to consume 5-7 servings of fruits and vegetables per day. We reviewed the "Nutrition Rainbow" handout, as well as the handout "Take Control of Your Health and Reduce Your Cancer Risk" from the [place].  She was also encouraged  to engage in moderate to vigorous exercise for 30 minutes per day most days of the week. We discussed the [employer] fitness program, which is designed for cancer survivors to help them become more physically fit after cancer treatments.  She was instructed to limit her alcohol consumption and continue to abstain from tobacco use.     5. Support services/counseling: It is not uncommon for this period of the patient's cancer care trajectory to be one of many emotions and stressors.  We discussed an opportunity for her to participate in the next session of [facility] ("Finding Your New Normal") support group series designed for patients after they have completed treatment.   Shelley Lawrence was encouraged to take advantage of our many other support services programs, support groups, and/or counseling in coping with her new life as a cancer survivor after completing anti-cancer treatment.  She was offered support today through active listening and expressive supportive counseling.  She was given information regarding our available services and encouraged to contact me with any questions or for help enrolling in any of our support group/programs.    Dispo:   -Return to cancer center 03/2017 for follow up with Dr. Lindi Adie -She is welcome to return back to the Survivorship Clinic at any time; no additional follow-up needed at this time.  -Consider referral back to survivorship as a long-term survivor for continued surveillance  A total of (30) minutes of face-to-face time was spent with this patient with greater than 50% of that time in counseling and care-coordination.   Gardenia Phlegm, [place] [phone]   Note: PRIMARY CARE PROVIDER Jani Gravel, [place] [phone]

## 2016-11-12 ENCOUNTER — Encounter: Payer: Self-pay | Admitting: Adult Health

## 2016-11-12 ENCOUNTER — Ambulatory Visit (HOSPITAL_BASED_OUTPATIENT_CLINIC_OR_DEPARTMENT_OTHER): Payer: PPO | Admitting: Adult Health

## 2016-11-12 VITALS — BP 153/81 | HR 73 | Temp 97.5°F | Resp 18 | Ht 61.0 in | Wt 205.0 lb

## 2016-11-12 DIAGNOSIS — Z170 Estrogen receptor positive status [ER+]: Secondary | ICD-10-CM | POA: Diagnosis not present

## 2016-11-12 DIAGNOSIS — C50211 Malignant neoplasm of upper-inner quadrant of right female breast: Secondary | ICD-10-CM

## 2016-11-12 MED ORDER — NYSTATIN 100000 UNIT/GM EX POWD: Freq: Two times a day (BID) | CUTANEOUS | 0 refills | Status: DC

## 2016-11-13 ENCOUNTER — Telehealth: Payer: Self-pay | Admitting: *Deleted

## 2016-11-13 NOTE — Telephone Encounter (Signed)
Patient had a questions regarding why she is incontinent, and has increased joint ache. Per Mendel Ryder, NP chemo can cause incontinence, as well as joint ache can be made worse by antiestrogen therapy. Also patient had a question as to why she has "increased fatigue, can anything be done?" Per Mendel Ryder, NP exercise can help combat fatigue, however if noted over time that exercise is not helping, consult with PCP for further evaluation. Patient verbalized understanding. Made patient aware that we are currently awaiting a call from [facility] for physical therapy referral. Patient verbalized understanding, and expressed appreciation for the call.

## 2016-11-16 ENCOUNTER — Encounter: Payer: Self-pay | Admitting: *Deleted

## 2016-11-16 DIAGNOSIS — M1712 Unilateral primary osteoarthritis, left knee: Secondary | ICD-10-CM | POA: Diagnosis not present

## 2016-11-16 DIAGNOSIS — Z9011 Acquired absence of right breast and nipple: Secondary | ICD-10-CM | POA: Diagnosis not present

## 2016-11-16 DIAGNOSIS — M170 Bilateral primary osteoarthritis of knee: Secondary | ICD-10-CM | POA: Diagnosis not present

## 2016-11-16 DIAGNOSIS — M1711 Unilateral primary osteoarthritis, right knee: Secondary | ICD-10-CM | POA: Diagnosis not present

## 2016-11-16 NOTE — Progress Notes (Signed)
Per Mendel Ryder, NP follow up on physical therapy referral order from 09/21/16 to [facility]. Notified [facility] of the referral order- patient to be notified by the [facility] for follow up.

## 2016-11-19 ENCOUNTER — Ambulatory Visit
Admission: RE | Admit: 2016-11-19 | Discharge: 2016-11-19 | Disposition: A | Discharge status: Home / Self Care | Payer: PPO | Source: Ambulatory Visit | Attending: Adult Health | Admitting: Adult Health

## 2016-11-19 DIAGNOSIS — R928 Other abnormal and inconclusive findings on diagnostic imaging of breast: Secondary | ICD-10-CM | POA: Diagnosis not present

## 2016-11-19 DIAGNOSIS — Z170 Estrogen receptor positive status [ER+]: Principal | ICD-10-CM

## 2016-11-19 DIAGNOSIS — C50211 Malignant neoplasm of upper-inner quadrant of right female breast: Secondary | ICD-10-CM

## 2016-11-19 HISTORY — DX: Malignant neoplasm of unspecified site of unspecified female breast: C50.919

## 2016-11-23 ENCOUNTER — Telehealth: Payer: Self-pay

## 2016-11-23 NOTE — Telephone Encounter (Signed)
Pt had a nose bleed on Saturday afternoon. She plugged it up and held pressure for about 40 minutes. She is not sure how long it bled b/c she kept it plugged. She used about 3 kleenex, she did have some blood in her mouth. It has not happened since. She did hold anastrozole and baby ASA Sunday. Instructed her it is probably not from anastrozole and to resume it and baby ASA. Use OTC saline nasal spray prn to keep tissues moist. If it happens again to contact her PCP. She did remember she was in her old church on Saturday quite a bit and this may have contributed to dry tissues. Last chemo 01/27/16 Next OV 03/23/17

## 2016-11-24 ENCOUNTER — Ambulatory Visit: Payer: PPO | Attending: Hematology and Oncology | Admitting: Physical Therapy

## 2016-11-24 DIAGNOSIS — R2689 Other abnormalities of gait and mobility: Secondary | ICD-10-CM | POA: Diagnosis not present

## 2016-11-24 DIAGNOSIS — R2681 Unsteadiness on feet: Secondary | ICD-10-CM | POA: Diagnosis not present

## 2016-11-24 DIAGNOSIS — Z9181 History of falling: Secondary | ICD-10-CM | POA: Diagnosis not present

## 2016-11-24 DIAGNOSIS — R293 Abnormal posture: Secondary | ICD-10-CM | POA: Diagnosis not present

## 2016-11-24 NOTE — Therapy (Signed)
[place], [place], [address] Phone: [phone]   Fax:  [phone]  Physical Therapy Evaluation  Patient Details  Name: Shelley Lawrence MRN: [id number] Date of Birth: 02/28/1941 Referring Provider: Dr. Nicholas Lose  Encounter Date: 11/24/2016      PT End of Session - 11/24/16 1929    Visit Number 1   Number of Visits 9   Date for PT Re-Evaluation 12/25/16   PT Start Time 1524   PT Stop Time 1607   PT Time Calculation (min) 43 min   Activity Tolerance Patient tolerated treatment well   Behavior During Therapy [facility] for tasks assessed/performed      Past Medical History:  Diagnosis Date  . Allergy   . Anemia   . Arthritis    knees  . Breast cancer ([place]) 10/14/2015   Right Breast  . Broken foot   . Carpal tunnel syndrome   . Diabetes mellitus without complication ([place])   . GERD (gastroesophageal reflux disease)   . History of radiation therapy 03/16/16-04/29/16   Right breast/ 50.4 Gy in 28 fractions and right breast boost / 12 Gy in 6 fractions.  . Hot flashes   . Hypertension     Past Surgical History:  Procedure Laterality Date  . ABDOMINAL HYSTERECTOMY    . BACK SURGERY    . BREAST BIOPSY Right 10/14/2015   U/S Core- Malignant  . BREAST LUMPECTOMY Right 10/30/2015  . BREAST LUMPECTOMY WITH NEEDLE LOCALIZATION AND AXILLARY SENTINEL LYMPH NODE BX Right 10/30/2015   Procedure: BREAST LUMPECTOMY WITH NEEDLE LOCALIZATION AND AXILLARY SENTINEL LYMPH NODE BX;  Surgeon: Autumn Messing III, MD;  Location: [place];  Service: General;  Laterality: Right;  . CARPAL TUNNEL RELEASE Right   . KNEE SURGERY    . OVARY SURGERY     removal of ovarian tumor  . PORTACATH PLACEMENT Left 11/21/2015   Procedure: INSERTION PORT-A-CATH;  Surgeon: Autumn Messing III, MD;  Location: [place];  Service: General;  Laterality: Left;  . TONSILECTOMY/ADENOIDECTOMY WITH MYRINGOTOMY    .  TUBAL LIGATION      There were no vitals filed for this visit.       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  I have arthritis in my knees anyway and that's worse now.  It's like the treatment has just made everything worse.  It just hurt. The neuropathy has gotten better with time.   Pertinent History Diagnosed with right breast cancer in spring 2017; had lumpectomy (2 nodes removed, both negative), chemo (completed 01/27/16) and radiation.  Completed radiation in mid-November 2017.  Arthritis in knees. HTN but lately it hasn't been high. Diabetes. Carpal tunnel syndrome in left hand (had surgery for right 20 years ago.   Patient Stated Goals to regain my balance and my sense of stability   Currently in Pain? Yes   Pain Score 8    Pain Location Knee   Pain Orientation Right;Left   Pain Descriptors / Indicators Dull;Throbbing   Pain Type Chronic pain   Aggravating Factors  weightbearing, other times   Pain Relieving Factors ibuprofen helps some            [facility] PT Assessment - 11/24/16 0001      Assessment   Medical Diagnosis right breast   Referring Provider Dr. Nicholas Lose  Onset Date/Surgical Date --  spring 2017   Hand Dominance Right     Precautions   Precautions Fall;Other (comment)   Precaution Comments cancer precautions     Restrictions   Weight Bearing Restrictions No     Balance Screen   Has the patient fallen in the past 6 months Yes  just last Saturday evening, tripped on side table   How many times? 1   Has the patient had a decrease in activity level because of a fear of falling?  Yes   Is the patient reluctant to leave their home because of a fear of falling?  No     Home Environment   Living Environment Private residence   Living Arrangements Alone   Type of [place] Access Level entry   [place] One level     Sensation    Light Touch Appears Intact   Additional Comments she reports numbness and tingling in lateral lower legs     Posture/Postural Control   Posture/Postural Control Postural limitations   Postural Limitations Rounded Shoulders;Forward head     Ambulation/Gait   Ambulation/Gait Yes   Ambulation/Gait Assistance 6: Modified independent (Device/Increase time)   Assistive device Rolling walker;Straight cane  uses this half the time at home; cane otherwise used   Gait Pattern --  antalgic vs. Trendelenberg?   Stairs Yes  she reports fear with stairs or curb; uses railing on stairs   Gait Comments uses cane in right hand; says she does stairs slowly     [employer]   Sit to Stand Able to stand  independently using hands   Standing Unsupported Able to stand safely 2 minutes   Sitting with Back Unsupported but Feet Supported on Floor or Stool Able to sit safely and securely 2 minutes   Stand to Sit Controls descent by using hands   Transfers Able to transfer safely, definite need of hands   Standing Unsupported with Eyes Closed Able to stand 10 seconds safely   Standing Ubsupported with Feet Together Able to place feet together independently and stand 1 minute safely   From Standing, Reach Forward with Outstretched Arm Can reach forward >12 cm safely (5")   From Standing Position, Pick up Object from Floor Able to pick up shoe, needs supervision   From Standing Position, Turn to Look Behind Over each Shoulder Turn sideways only but maintains balance   Turn 360 Degrees Able to turn 360 degrees safely but slowly   Standing Unsupported, Alternately Place Feet on Step/Stool Able to stand independently and safely and complete 8 steps in 20 seconds   Standing Unsupported, One Foot in Front Able to take small step independently and hold 30 seconds   Standing on One Leg Tries to lift leg/unable to hold 3 seconds but remains standing independently   Total Score 42            Objective  measurements completed on examination: See above findings.                          [facility] Goals - 11/24/16 1941      CC Long Term Goal  #1   Title Patient will demonstrate safe home exercise program to reduce fall risk and improve balance, to include balance re-ed and knee strengthening.   Time 4   Period Weeks   Status New     CC Long Term Goal  #  2   Title Patient will increase BERG balance score to >/= 50/56 to reduce fall risk   Baseline 42/56 on eval   Time 4   Period Weeks   Status New     CC Long Term Goal  #3   Title Patient will show improvement in safe gait speed with cane.   Time 4   Period Weeks   Status New     CC Long Term Goal  #4   Title Pt. will report at least 50% perceived improvement in her stability while walking.   Time 4   Period Weeks   Status New             Plan - 11/24/16 1930    Clinical Impression Statement This is a very pleasant woman s/p lumpectomy, SLNB, chemo and radiation for right breast cancer.  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  She scored 42/56 on the Berg balance test compared to 48/56 prior to treatment and at time of diagnosis.  She walks with a cane today, but uses the cane OR walker at home and elsewhere. She would like to improve her stability.    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   Clinical Presentation Evolving   Clinical Presentation due to: neuropathy that is changing in her legs   Clinical Decision Making Moderate   Rehab Potential Good   PT Frequency 2x / week   PT Duration 4 weeks   PT Treatment/Interventions ADLs/Self Care Home Management;Aquatic Therapy;Gait training;Stair training;Functional mobility training;Therapeutic exercise;Neuromuscular re-education;Cognitive remediation;Patient/family  education;Orthotic Fit/Training;Energy conservation   PT Next Visit Plan Do gait speed test and/or dynamic gait index and LE manual muscle test, especially quads and hamstrings.  Begin balance re-ed with standing on compliant surface, tandem stance, single leg stance, etc.  Give initial HEP.   Consulted and Agree with Plan of Care Patient      Patient will benefit from skilled therapeutic intervention in order to improve the following deficits and impairments:  Abnormal gait, Decreased balance, Decreased mobility  Visit Diagnosis: Unsteadiness on feet - Plan: PT plan of care cert/re-cert  Other abnormalities of gait and mobility - Plan: PT plan of care cert/re-cert  Abnormal posture - Plan: PT plan of care cert/re-cert  History of falling - Plan: PT plan of care cert/re-cert      G-Codes - 31/49/70 1946    Functional Assessment Tool Used (Outpatient Only) Berg balance test (42/56 at eval =75%)   Functional Limitation Mobility: Walking and moving around   Mobility: Walking and Moving Around Current Status [phone]) At least 20 percent but less than 40 percent impaired, limited or restricted   Mobility: Walking and Moving Around Goal Status [phone]) At least 1 percent but less than 20 percent impaired, limited or restricted       Problem List Patient Active Problem List   Diagnosis Date Noted  . Port catheter in place 01/06/2016  . Breast cancer of upper-inner quadrant of right female breast ([place]) 10/16/2015  . Onychomycosis 11/22/2012  . Pain in joint, ankle and foot 11/22/2012  . Hammer toe 11/22/2012  . DIABETES MELLITUS-TYPE II 07/04/2009  . ANEMIA-IRON DEFICIENCY 07/04/2009  . ESOPHAGEAL STRICTURE 07/04/2009  . GERD 07/04/2009  . 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    SALISBURY,DONNA 11/24/2016, 7:50 PM  [facility] [place], [place], [address] Phone: [phone]   Fax:   [phone]  Name: AKAYLAH LALLEY MRN: [id number] Date of Birth: 1940/12/30  [name], PT 11/24/16 7:50 PM

## 2016-12-14 DIAGNOSIS — E1122 Type 2 diabetes mellitus with diabetic chronic kidney disease: Secondary | ICD-10-CM | POA: Diagnosis not present

## 2016-12-14 DIAGNOSIS — E7800 Pure hypercholesterolemia, unspecified: Secondary | ICD-10-CM | POA: Diagnosis not present

## 2016-12-14 DIAGNOSIS — I10 Essential (primary) hypertension: Secondary | ICD-10-CM | POA: Diagnosis not present

## 2016-12-16 ENCOUNTER — Ambulatory Visit: Payer: PPO | Attending: Hematology and Oncology | Admitting: Physical Therapy

## 2016-12-16 DIAGNOSIS — Z9181 History of falling: Secondary | ICD-10-CM

## 2016-12-16 DIAGNOSIS — R293 Abnormal posture: Secondary | ICD-10-CM | POA: Insufficient documentation

## 2016-12-16 DIAGNOSIS — R2689 Other abnormalities of gait and mobility: Secondary | ICD-10-CM

## 2016-12-16 DIAGNOSIS — R2681 Unsteadiness on feet: Secondary | ICD-10-CM | POA: Insufficient documentation

## 2016-12-16 NOTE — Therapy (Signed)
[place], [place], [address] Phone: [phone]   Fax:  [phone]  Physical Therapy Treatment  Patient Details  Name: Shelley Lawrence MRN: [id number] Date of Birth: 1941-02-06 Referring Provider: Dr. Nicholas Lose  Encounter Date: 12/16/2016      PT End of Session - 12/16/16 1724    Visit Number 2   Number of Visits 9   Date for PT Re-Evaluation 12/25/16   PT Start Time 1440   PT Stop Time 1525   PT Time Calculation (min) 45 min   Activity Tolerance Patient tolerated treatment well   Behavior During Therapy [facility] for tasks assessed/performed      Past Medical History:  Diagnosis Date  . Allergy   . Anemia   . Arthritis    knees  . Breast cancer ([place]) 10/14/2015   Right Breast  . Broken foot   . Carpal tunnel syndrome   . Diabetes mellitus without complication ([place])   . GERD (gastroesophageal reflux disease)   . History of radiation therapy 03/16/16-04/29/16   Right breast/ 50.4 Gy in 28 fractions and right breast boost / 12 Gy in 6 fractions.  . Hot flashes   . Hypertension     Past Surgical History:  Procedure Laterality Date  . ABDOMINAL HYSTERECTOMY    . BACK SURGERY    . BREAST BIOPSY Right 10/14/2015   U/S Core- Malignant  . BREAST LUMPECTOMY Right 10/30/2015  . BREAST LUMPECTOMY WITH NEEDLE LOCALIZATION AND AXILLARY SENTINEL LYMPH NODE BX Right 10/30/2015   Procedure: BREAST LUMPECTOMY WITH NEEDLE LOCALIZATION AND AXILLARY SENTINEL LYMPH NODE BX;  Surgeon: Autumn Messing III, MD;  Location: [place];  Service: General;  Laterality: Right;  . CARPAL TUNNEL RELEASE Right   . KNEE SURGERY    . OVARY SURGERY     removal of ovarian tumor  . PORTACATH PLACEMENT Left 11/21/2015   Procedure: INSERTION PORT-A-CATH;  Surgeon: Autumn Messing III, MD;  Location: [place];  Service: General;  Laterality: Left;  . TONSILECTOMY/ADENOIDECTOMY WITH MYRINGOTOMY    .  TUBAL LIGATION      There were no vitals filed for this visit.      Subjective Assessment - 12/16/16 1445    Subjective "I'm changing blood pressure pills. I went to see my primary care physician on Monday. Everything's okay."   Currently in Pain? Yes   Pain Score 4    Pain Location Leg   Pain Orientation Left;Posterior   Aggravating Factors  doesn't know, but the amount of walking affects it, as does getting in and out of the car   Pain Relieving Factors less leg activity            [facility] PT Assessment - 12/16/16 0001      ROM / Strength   AROM / PROM / Strength Strength     Strength   Strength Assessment Site Knee   Right/Left Knee Right;Left   Right Knee Flexion 5/5  tested in sitting, not anti-gravity   Right Knee Extension 4+/5   Left Knee Flexion 3/5  in sitting, not anti-gravity: not able to take resistance   Left Knee Extension 3/5  pain at posterior knee     Standardized Balance Assessment   Standardized Balance Assessment Timed Up and Go Test     Timed Up and Go Test   Normal TUG (seconds) 16  no assistive device     Functional Gait  Assessment  Gait assessed  Yes                     [facility] Adult PT Treatment/Exercise - 12/16/16 0001      High Level Balance   High Level Balance Activities Head turns  stand in corner holding card, head turns x 10 each way (easy     Neuro Re-ed    Neuro Re-ed Details  stand in corner on foam pad with eyes open, eyes closed x 30 seconds x 2 each with close supervision; take small steps in place on foam pad x 5 each side; stand with one foot forward of the other, hold 30 seconds with each foot forward one time     Exercises   Exercises Knee/Hip     Knee/Hip Exercises: Standing   Heel Raises Both;1 second  2 lb. ankle weights   Hip ADduction Strengthening;Right;Left;10 reps  2 lb. weight on each ankle, hands on bike seat back   Hip Extension Stengthening;Right;Left;10 reps;Knee straight  2 lb. ankle  weights, hands on bike seat back     Knee/Hip Exercises: Seated   Other Seated Knee/Hip Exercises SLR in sitting with 2 lb. weights on ankle x10  left hurt                PT Education - 12/16/16 1724    Education provided Yes   Education Details standing SLR, heel raises   Person(s) Educated Patient   Methods Explanation;Demonstration;Verbal cues;Handout   Comprehension Verbalized understanding;Returned demonstration                [facility] Goals - 11/24/16 1941      CC Long Term Goal  #1   Title Patient will demonstrate safe home exercise program to reduce fall risk and improve balance, to include balance re-ed and knee strengthening.   Time 4   Period Weeks   Status New     CC Long Term Goal  #2   Title Patient will increase BERG balance score to >/= 50/56 to reduce fall risk   Baseline 42/56 on eval   Time 4   Period Weeks   Status New     CC Long Term Goal  #3   Title Patient will show improvement in safe gait speed with cane.   Time 4   Period Weeks   Status New     CC Long Term Goal  #4   Title Pt. will report at least 50% perceived improvement in her stability while walking.   Time 4   Period Weeks   Status New            Plan - 12/16/16 1725    Clinical Impression Statement Patient was challenged by exercises today, but rose to the challenge and was able to perform what was asked of her.    Rehab Potential Good   PT Frequency 2x / week   PT Duration 4 weeks   PT Treatment/Interventions ADLs/Self Care Home Management;Aquatic Therapy;Gait training;Stair training;Functional mobility training;Therapeutic exercise;Neuromuscular re-education;Cognitive remediation;Patient/family education;Orthotic Fit/Training;Energy conservation   PT Next Visit Plan Assess effects of first session.  Review HEP prn.  Continue to work on LE strengthening, balance, and gait.   PT Home Exercise Plan standing SLRs, heel raises   Consulted and Agree with  Plan of Care Patient      Patient will benefit from skilled therapeutic intervention in order to improve the following deficits and impairments:  Abnormal gait, Decreased balance, Decreased  mobility  Visit Diagnosis: Unsteadiness on feet  Other abnormalities of gait and mobility  History of falling     Problem List Patient Active Problem List   Diagnosis Date Noted  . Port catheter in place 01/06/2016  . Breast cancer of upper-inner quadrant of right female breast ([place]) 10/16/2015  . Onychomycosis 11/22/2012  . Pain in joint, ankle and foot 11/22/2012  . Hammer toe 11/22/2012  . DIABETES MELLITUS-TYPE II 07/04/2009  . ANEMIA-IRON DEFICIENCY 07/04/2009  . ESOPHAGEAL STRICTURE 07/04/2009  . GERD 07/04/2009  . 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    Shelley Lawrence 12/16/2016, 5:27 PM  [place], [place], [address] Phone: [phone]   Fax:  [phone]  Name: Shelley Lawrence MRN: [id number] Date of Birth: July 31, 1940  [name], PT 12/16/16 5:27 PM

## 2016-12-16 NOTE — Patient Instructions (Signed)
Knee High   Holding stable object, kick leg forward, KEEPING KNEE STRAIGHT (unlike in picture). Repeat with other knee. Complete __10_ repetitions. Do __2__ sessions per day.  ABDUCTION: Standing (Active)   Stand, feet flat. Lift right leg out to side. Use _0__ lbs. Complete __10_ repetitions. Perform __2_ sessions per day.  ADDUCTION: Standing - Stable (Active)   Stand, right leg out to side as far as possible. Draw leg in across midline. Use _0__ lbs. Complete 10_ repetitions. Perform _2__ sessions per day.       EXTENSION: Standing (Active)  Stand, both feet flat. Draw right leg behind body as far as possible. Use 0___ lbs. Complete 10 repetitions. Perform __2_ sessions per day.  Copyright  V[employer]. All rights reserved.   Standing with both feet on floor, raise up on your toes.  Do 30 reps, 1-2 sessions/day.

## 2016-12-17 ENCOUNTER — Other Ambulatory Visit: Payer: Self-pay

## 2016-12-17 MED ORDER — LETROZOLE 2.5 MG PO TABS: 2.5000 mg | ORAL_TABLET | Freq: Every day | ORAL | 0 refills | Status: DC

## 2016-12-17 NOTE — Progress Notes (Signed)
Called pt to let her know that Dr.Gudena is okay switching pt to letrozole daily instead of anastrozole. Told pt that she may pick it up at her pharmacy today. Pt instructed on possible side effects, similar to anastrozole. Pt verbalized understanding and will call with any issues or concerns.

## 2016-12-21 ENCOUNTER — Ambulatory Visit: Payer: PPO | Admitting: Physical Therapy

## 2016-12-21 DIAGNOSIS — R2681 Unsteadiness on feet: Secondary | ICD-10-CM | POA: Diagnosis not present

## 2016-12-21 DIAGNOSIS — R2689 Other abnormalities of gait and mobility: Secondary | ICD-10-CM

## 2016-12-21 DIAGNOSIS — Z9181 History of falling: Secondary | ICD-10-CM

## 2016-12-21 NOTE — Therapy (Signed)
[place], [place], [address] Phone: [phone]   Fax:  [phone]  Physical Therapy Treatment  Patient Details  Name: Shelley Lawrence MRN: [id number] Date of Birth: February 06, 1941 Referring Provider: Dr. Nicholas Lose  Encounter Date: 12/21/2016      PT End of Session - 12/21/16 1709    Visit Number 3   Number of Visits 9   Date for PT Re-Evaluation 12/25/16   PT Start Time 9381   PT Stop Time 8299   PT Time Calculation (min) 39 min   Activity Tolerance Patient tolerated treatment well   Behavior During Therapy [facility] for tasks assessed/performed      Past Medical History:  Diagnosis Date  . Allergy   . Anemia   . Arthritis    knees  . Breast cancer ([place]) 10/14/2015   Right Breast  . Broken foot   . Carpal tunnel syndrome   . Diabetes mellitus without complication ([place])   . GERD (gastroesophageal reflux disease)   . History of radiation therapy 03/16/16-04/29/16   Right breast/ 50.4 Gy in 28 fractions and right breast boost / 12 Gy in 6 fractions.  . Hot flashes   . Hypertension     Past Surgical History:  Procedure Laterality Date  . ABDOMINAL HYSTERECTOMY    . BACK SURGERY    . BREAST BIOPSY Right 10/14/2015   U/S Core- Malignant  . BREAST LUMPECTOMY Right 10/30/2015  . BREAST LUMPECTOMY WITH NEEDLE LOCALIZATION AND AXILLARY SENTINEL LYMPH NODE BX Right 10/30/2015   Procedure: BREAST LUMPECTOMY WITH NEEDLE LOCALIZATION AND AXILLARY SENTINEL LYMPH NODE BX;  Surgeon: Autumn Messing III, MD;  Location: [place];  Service: General;  Laterality: Right;  . CARPAL TUNNEL RELEASE Right   . KNEE SURGERY    . OVARY SURGERY     removal of ovarian tumor  . PORTACATH PLACEMENT Left 11/21/2015   Procedure: INSERTION PORT-A-CATH;  Surgeon: Autumn Messing III, MD;  Location: [place];  Service: General;  Laterality: Left;  . TONSILECTOMY/ADENOIDECTOMY WITH MYRINGOTOMY    .  TUBAL LIGATION      There were no vitals filed for this visit.      Subjective Assessment - 12/21/16 1535    Subjective Back is bothering her today.  A little bit of soreness after last visit here, at back of the left leg.  This past week was not a good week--I had all kinds of stuff going on, so only did the exercise once.   Pertinent History Diagnosed with right breast cancer in spring 2017; had lumpectomy (2 nodes removed, both negative), chemo (completed 01/27/16) and radiation.  Completed radiation in mid-November 2017.  Arthritis in knees. HTN but lately it hasn't been high. Diabetes. Carpal tunnel syndrome in left hand (had surgery for right 20 years ago.   Currently in Pain? Yes   Pain Score 6    Pain Location Back   Pain Orientation Lower   Pain Descriptors / Indicators Dull;Aching   Aggravating Factors  overdoing, standing too long   Pain Relieving Factors pain meds, but she chooses not to do too much of that                         [facility] Adult PT Treatment/Exercise - 12/21/16 0001      Ambulation/Gait   Ambulation Distance (Feet) 150 Feet  x 2, over to ortho gym   Assistive device Rolling  walker     Neuro Re-ed    Neuro Re-ed Details  standing on rebounder, first holding handles and then releasing UE support, small bounces to get a feel for the equipment; weight shift right to left, small bounces, then standing still but alternating arm flexion/extension motions.  Later, standing with back to corner, 1/2 kg. ball toss with therapist aiming ball to different heights to have patient reach while maintaining balance.     Knee/Hip Exercises: Standing   Heel Raises Both;10 reps;20 reps;1 second  2 lb. weights on ankles   Hip Flexion AROM;Right;Left;15 reps;Knee straight   Hip ADduction AROM;Right;Left;10 reps;5 reps  Last session this was ABduction, not aDduction   Hip Abduction AROM;Right;Left;10 reps;Knee straight;5 reps   Hip Extension AROM;Right;Left;10  reps;Knee straight;5 reps     Knee/Hip Exercises: Seated   Other Seated Knee/Hip Exercises SLR in sitting with 2 lb. weight on ankles x 10 right, with left knee bent x 10                        [facility] Goals - 11/24/16 1941      CC Long Term Goal  #1   Title Patient will demonstrate safe home exercise program to reduce fall risk and improve balance, to include balance re-ed and knee strengthening.   Time 4   Period Weeks   Status New     CC Long Term Goal  #2   Title Patient will increase BERG balance score to >/= 50/56 to reduce fall risk   Baseline 42/56 on eval   Time 4   Period Weeks   Status New     CC Long Term Goal  #3   Title Patient will show improvement in safe gait speed with cane.   Time 4   Period Weeks   Status New     CC Long Term Goal  #4   Title Pt. will report at least 50% perceived improvement in her stability while walking.   Time 4   Period Weeks   Status New            Plan - 12/21/16 1710    Clinical Impression Statement Patient seemed a little fearful of trying some of the exercises today, but did well.  She hadn't done the home exercise program but once, so that was reviewed.   Rehab Potential Good   PT Frequency 2x / week   PT Duration 4 weeks   PT Treatment/Interventions ADLs/Self Care Home Management;Aquatic Therapy;Gait training;Stair training;Functional mobility training;Therapeutic exercise;Neuromuscular re-education;Cognitive remediation;Patient/family education;Orthotic Fit/Training;Energy conservation   PT Next Visit Plan Try ball toss against rebounder.  Possibly tried lightly resisted walking.  Add some balance exercise to HEP.   Consulted and Agree with Plan of Care Patient      Patient will benefit from skilled therapeutic intervention in order to improve the following deficits and impairments:  Abnormal gait, Decreased balance, Decreased mobility  Visit Diagnosis: Unsteadiness on feet  Other  abnormalities of gait and mobility  History of falling     Problem List Patient Active Problem List   Diagnosis Date Noted  . Port catheter in place 01/06/2016  . Breast cancer of upper-inner quadrant of right female breast ([place]) 10/16/2015  . Onychomycosis 11/22/2012  . Pain in joint, ankle and foot 11/22/2012  . Hammer toe 11/22/2012  . DIABETES MELLITUS-TYPE II 07/04/2009  . ANEMIA-IRON DEFICIENCY 07/04/2009  . ESOPHAGEAL STRICTURE 07/04/2009  . GERD 07/04/2009  .  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    SALISBURY,DONNA 12/21/2016, 5:12 PM  [place], [place], [address] Phone: [phone]   Fax:  [phone]  Name: GEORGETTE HELMER MRN: [id number] Date of Birth: 10-18-1940  [name], PT 12/21/16 5:12 PM

## 2016-12-23 ENCOUNTER — Ambulatory Visit: Payer: PPO | Admitting: Physical Therapy

## 2016-12-23 DIAGNOSIS — R2689 Other abnormalities of gait and mobility: Secondary | ICD-10-CM

## 2016-12-23 DIAGNOSIS — R2681 Unsteadiness on feet: Secondary | ICD-10-CM | POA: Diagnosis not present

## 2016-12-23 DIAGNOSIS — Z9181 History of falling: Secondary | ICD-10-CM

## 2016-12-23 NOTE — Therapy (Signed)
[place], [place], [address] Phone: [phone]   Fax:  [phone]  Physical Therapy Treatment  Patient Details  Name: Shelley Lawrence MRN: [id number] Date of Birth: [birth year] Referring Provider: Dr. Nicholas Lose  Encounter Date: 12/23/2016      PT End of Session - 12/23/16 1625    Visit Number 4   Number of Visits 9   Date for PT Re-Evaluation 12/25/16   PT Start Time 7628   PT Stop Time 1433   PT Time Calculation (min) 48 min   Activity Tolerance Patient tolerated treatment well   Behavior During Therapy [facility] for tasks assessed/performed      Past Medical History:  Diagnosis Date  . Allergy   . Anemia   . Arthritis    knees  . Breast cancer ([place]) 10/14/2015   Right Breast  . Broken foot   . Carpal tunnel syndrome   . Diabetes mellitus without complication ([place])   . GERD (gastroesophageal reflux disease)   . History of radiation therapy 03/16/16-04/29/16   Right breast/ 50.4 Gy in 28 fractions and right breast boost / 12 Gy in 6 fractions.  . Hot flashes   . Hypertension     Past Surgical History:  Procedure Laterality Date  . ABDOMINAL HYSTERECTOMY    . BACK SURGERY    . BREAST BIOPSY Right 10/14/2015   U/S Core- Malignant  . BREAST LUMPECTOMY Right 10/30/2015  . BREAST LUMPECTOMY WITH NEEDLE LOCALIZATION AND AXILLARY SENTINEL LYMPH NODE BX Right 10/30/2015   Procedure: BREAST LUMPECTOMY WITH NEEDLE LOCALIZATION AND AXILLARY SENTINEL LYMPH NODE BX;  Surgeon: Autumn Messing III, MD;  Location: [place];  Service: General;  Laterality: Right;  . CARPAL TUNNEL RELEASE Right   . KNEE SURGERY    . OVARY SURGERY     removal of ovarian tumor  . PORTACATH PLACEMENT Left 11/21/2015   Procedure: INSERTION PORT-A-CATH;  Surgeon: Autumn Messing III, MD;  Location: [place];  Service: General;  Laterality: Left;  . TONSILECTOMY/ADENOIDECTOMY WITH MYRINGOTOMY    .  TUBAL LIGATION      There were no vitals filed for this visit.      Subjective Assessment - 12/23/16 1527    Subjective I did do the exercise once yesterday and again this morning.   Currently in Pain? Yes   Pain Score 6    Pain Location Calf   Pain Orientation Left;Posterior   Pain Descriptors / Indicators Aching   Aggravating Factors  being on her feet a lot   Pain Relieving Factors meds (ibuprofen)                         [facility] Adult PT Treatment/Exercise - 12/23/16 0001      High Level Balance   High Level Balance Activities Side stepping   High Level Balance Comments 25 feet each way; used cane a little     Neuro Re-ed    Neuro Re-ed Details  standing with back in corner:  eyes closed x 40 seconds; SLS on right foot x 3, on left foot x 2 (but left leg painful); then on foam pad with eyes open, then eyes closed x 40 seconds, then alternating arm swings up and down, then pumping both arms up overhead, then small marching steps in place, then trunk rotation; then row vs. red Theraband while standing on foam pad x 10 with eyes open and  eyes closed. Standing on floor:  catch and throw with ball tossed at varying heights, then standing with one foot forward and one back (doing each foot forward).     Knee/Hip Exercises: Standing   Walking with Sports Cord on treadmill just to have handrails, resistance with therapist holding green theraband, pt. going forward, backward, and to each side x 5 each way                        [place] [facility] Goals - 11/24/16 1941      CC Long Term Goal  #1   Title Patient will demonstrate safe home exercise program to reduce fall risk and improve balance, to include balance re-ed and knee strengthening.   Time 4   Period Weeks   Status New     CC Long Term Goal  #2   Title Patient will increase BERG balance score to >/= 50/56 to reduce fall risk   Baseline 42/56 on eval   Time 4   Period Weeks   Status New      CC Long Term Goal  #3   Title Patient will show improvement in safe gait speed with cane.   Time 4   Period Weeks   Status New     CC Long Term Goal  #4   Title Pt. will report at least 50% perceived improvement in her stability while walking.   Time 4   Period Weeks   Status New            Plan - 12/23/16 1625    Clinical Impression Statement It's somewhat difficult to find exercises that will challenge this patient in ways that she needs it while not aggravating her left knee pain.  She says she will stop if left knee hurts.     Rehab Potential Good   PT Frequency 2x / week   PT Duration 4 weeks   PT Treatment/Interventions ADLs/Self Care Home Management;Aquatic Therapy;Gait training;Stair training;Functional mobility training;Therapeutic exercise;Neuromuscular re-education;Cognitive remediation;Patient/family education;Orthotic Fit/Training;Energy conservation   PT Next Visit Plan Reassess goals.  Will need renewal. Try ball toss against rebounder.  Try more lightly resisted walking.  Add some balance exercise to HEP.   PT Home Exercise Plan standing SLRs, heel raises   Consulted and Agree with Plan of Care Patient      Patient will benefit from skilled therapeutic intervention in order to improve the following deficits and impairments:  Abnormal gait, Decreased balance, Decreased mobility  Visit Diagnosis: Unsteadiness on feet  Other abnormalities of gait and mobility  History of falling     Problem List Patient Active Problem List   Diagnosis Date Noted  . Port catheter in place 01/06/2016  . Breast cancer of upper-inner quadrant of right female breast ([place]) 10/16/2015  . Onychomycosis 11/22/2012  . Pain in joint, ankle and foot 11/22/2012  . Hammer toe 11/22/2012  . DIABETES MELLITUS-TYPE II 07/04/2009  . ANEMIA-IRON DEFICIENCY 07/04/2009  . ESOPHAGEAL STRICTURE 07/04/2009  . GERD 07/04/2009  . 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     SALISBURY,DONNA 12/23/2016, 4:31 PM  [place], [place], [address] Phone: [phone]   Fax:  [phone]  Name: Shelley Lawrence MRN: [id number] Date of Birth: [birth year]  [name], PT 12/23/16 4:31 PM

## 2016-12-28 ENCOUNTER — Ambulatory Visit: Payer: PPO | Admitting: Physical Therapy

## 2016-12-28 DIAGNOSIS — Z9181 History of falling: Secondary | ICD-10-CM

## 2016-12-28 DIAGNOSIS — R2689 Other abnormalities of gait and mobility: Secondary | ICD-10-CM

## 2016-12-28 DIAGNOSIS — R2681 Unsteadiness on feet: Secondary | ICD-10-CM | POA: Diagnosis not present

## 2016-12-28 NOTE — Therapy (Signed)
[place], [place], [address] Phone: [phone]   Fax:  [phone]  Physical Therapy Treatment  Patient Details  Name: Shelley Lawrence MRN: [id number] Date of Birth: [birth year] Referring Provider: Dr. Nicholas Lose  Encounter Date: 12/28/2016      PT End of Session - 12/28/16 1706    Visit Number 5   Number of Visits 13   Date for PT Re-Evaluation 02/12/17   PT Start Time 1601   PT Stop Time 0630   PT Time Calculation (min) 57 min   Activity Tolerance Patient tolerated treatment well   Behavior During Therapy [facility] for tasks assessed/performed      Past Medical History:  Diagnosis Date  . Allergy   . Anemia   . Arthritis    knees  . Breast cancer ([place]) 10/14/2015   Right Breast  . Broken foot   . Carpal tunnel syndrome   . Diabetes mellitus without complication ([place])   . GERD (gastroesophageal reflux disease)   . History of radiation therapy 03/16/16-04/29/16   Right breast/ 50.4 Gy in 28 fractions and right breast boost / 12 Gy in 6 fractions.  . Hot flashes   . Hypertension     Past Surgical History:  Procedure Laterality Date  . ABDOMINAL HYSTERECTOMY    . BACK SURGERY    . BREAST BIOPSY Right 10/14/2015   U/S Core- Malignant  . BREAST LUMPECTOMY Right 10/30/2015  . BREAST LUMPECTOMY WITH NEEDLE LOCALIZATION AND AXILLARY SENTINEL LYMPH NODE BX Right 10/30/2015   Procedure: BREAST LUMPECTOMY WITH NEEDLE LOCALIZATION AND AXILLARY SENTINEL LYMPH NODE BX;  Surgeon: Autumn Messing III, MD;  Location: [place];  Service: General;  Laterality: Right;  . CARPAL TUNNEL RELEASE Right   . KNEE SURGERY    . OVARY SURGERY     removal of ovarian tumor  . PORTACATH PLACEMENT Left 11/21/2015   Procedure: INSERTION PORT-A-CATH;  Surgeon: Autumn Messing III, MD;  Location: [place];  Service: General;  Laterality: Left;  . TONSILECTOMY/ADENOIDECTOMY WITH MYRINGOTOMY    .  TUBAL LIGATION      There were no vitals filed for this visit.      Subjective Assessment - 12/28/16 1633    Subjective Thumb is hurting on left hand. She notices improvement in stability with gait in that she will get up and not automatically reach for the cane each time.   Currently in Pain? Yes   Pain Score 7    Pain Location Finger (Comment which one)  1st   Pain Orientation Left   Aggravating Factors  moving it around and it pops, which hurts   Pain Relieving Factors wearing a splint            [facility] PT Assessment - 12/28/16 0001      Timed Up and Go Test   Normal TUG (seconds) 13                     [facility] Adult PT Treatment/Exercise - 12/28/16 0001      Self-Care   Self-Care Other Self-Care Comments     Neuro Re-ed    Neuro Re-ed Details  ball toss and catch against rebounder with 0.5 kg. ball and 1 kg. ball; stand on rocker board on treadmill to use handrails for UE support and rock forward-back, side to side; stand on BOSU ball on treadmill to use handrails for support and try to balance, then  try to weight shift; then have right foot on BOSU ball and left foot behind on treadmill and work to weight shift forward onto right and back onto left  side to side weight shift on BOSU was painful in left knee     Knee/Hip Exercises: Standing   Walking with Sports Cord at Free motion machine, tried 3,7, 10, and 17 lbs. resistance forward, then did 20 lbs. x 2 reps forward, backward, and to each side   Other Standing Knee Exercises standing with back in corner, foot taps on 6 inch step x 10 each, then 8 inch step x 10 each                        Long Term Clinic Goals - 12/28/16 1634      CC Long Term Goal  #1   Title Patient will demonstrate safe home exercise program to reduce fall risk and improve balance, to include balance re-ed and knee strengthening.   Status On-going     CC Long Term Goal  #2   Title Patient will increase BERG  balance score to >/= 50/56 to reduce fall risk   Status On-going     CC Long Term Goal  #4   Title Pt. will report at least 50% perceived improvement in her stability while walking.   Baseline reports 15% improvement as of 12/28/16   Status On-going            Plan - 12/28/16 1707    Clinical Impression Statement Patient remains skeptical that she will be able to perform new exercises that she is asked to do, but typically is able to manage.  She was challenged by resisted walking and BOSU ball especially today.  Pt. Has now been seen for 4 treatment sessions.  She has partially met goals. She does feel steadier on her feet than prior to therapy, so feels it is benefiting her.   Rehab Potential Good   PT Frequency 2x / week   PT Duration 6 weeks   PT Treatment/Interventions ADLs/Self Care Home Management;Aquatic Therapy;Gait training;Stair training;Functional mobility training;Therapeutic exercise;Neuromuscular re-education;Cognitive remediation;Patient/family education;Orthotic Fit/Training;Energy conservation   PT Next Visit Plan Add to home exercise program. Continue neuromuscular re-ed and LE strengthening.   PT Home Exercise Plan standing SLRs, heel raises   Consulted and Agree with Plan of Care Patient      Patient will benefit from skilled therapeutic intervention in order to improve the following deficits and impairments:  Abnormal gait, Decreased balance, Decreased mobility  Visit Diagnosis: Unsteadiness on feet - Plan: PT plan of care cert/re-cert  Other abnormalities of gait and mobility - Plan: PT plan of care cert/re-cert  History of falling - Plan: PT plan of care cert/re-cert     Problem List Patient Active Problem List   Diagnosis Date Noted  . Port catheter in place 01/06/2016  . Breast cancer of upper-inner quadrant of right female breast ([facility]) 10/16/2015  . Onychomycosis 11/22/2012  . Pain in joint, ankle and foot 11/22/2012  . Hammer toe 11/22/2012  .  DIABETES MELLITUS-TYPE II 07/04/2009  . ANEMIA-IRON DEFICIENCY 07/04/2009  . ESOPHAGEAL STRICTURE 07/04/2009  . GERD 07/04/2009  . 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    SALISBURY,DONNA 12/28/2016, 5:14 PM  [facility] [address] [place], [place], [address] Phone: [phone]   Fax:  [phone]  Name: Shelley Lawrence MRN: [id number] Date of Birth: 02-06-1941  Lupita Leash  [place], [place] 12/28/16 5:14 PM

## 2016-12-30 ENCOUNTER — Ambulatory Visit: Payer: PPO | Admitting: Physical Therapy

## 2016-12-30 DIAGNOSIS — R2681 Unsteadiness on feet: Secondary | ICD-10-CM | POA: Diagnosis not present

## 2016-12-30 DIAGNOSIS — R293 Abnormal posture: Secondary | ICD-10-CM

## 2016-12-30 DIAGNOSIS — R2689 Other abnormalities of gait and mobility: Secondary | ICD-10-CM

## 2016-12-30 DIAGNOSIS — Z9181 History of falling: Secondary | ICD-10-CM

## 2016-12-30 NOTE — Therapy (Signed)
[place], [place], [address] Phone: [phone]   Fax:  [phone]  Physical Therapy Treatment  Patient Details  Name: Shelley Lawrence MRN: [id number] Date of Birth: [birth year] Referring Provider: Dr. Nicholas Lose  Encounter Date: 12/30/2016      PT End of Session - 12/30/16 1713    Visit Number 6   Number of Visits 13   Date for PT Re-Evaluation 02/12/17   PT Start Time 1497   PT Stop Time 1434   PT Time Calculation (min) 45 min   Activity Tolerance Patient tolerated treatment well   Behavior During Therapy [facility] for tasks assessed/performed      Past Medical History:  Diagnosis Date  . Allergy   . Anemia   . Arthritis    knees  . Breast cancer ([place]) 10/14/2015   Right Breast  . Broken foot   . Carpal tunnel syndrome   . Diabetes mellitus without complication ([place])   . GERD (gastroesophageal reflux disease)   . History of radiation therapy 03/16/16-04/29/16   Right breast/ 50.4 Gy in 28 fractions and right breast boost / 12 Gy in 6 fractions.  . Hot flashes   . Hypertension     Past Surgical History:  Procedure Laterality Date  . ABDOMINAL HYSTERECTOMY    . BACK SURGERY    . BREAST BIOPSY Right 10/14/2015   U/S Core- Malignant  . BREAST LUMPECTOMY Right 10/30/2015  . BREAST LUMPECTOMY WITH NEEDLE LOCALIZATION AND AXILLARY SENTINEL LYMPH NODE BX Right 10/30/2015   Procedure: BREAST LUMPECTOMY WITH NEEDLE LOCALIZATION AND AXILLARY SENTINEL LYMPH NODE BX;  Surgeon: Autumn Messing III, MD;  Location: [place];  Service: General;  Laterality: Right;  . CARPAL TUNNEL RELEASE Right   . KNEE SURGERY    . OVARY SURGERY     removal of ovarian tumor  . PORTACATH PLACEMENT Left 11/21/2015   Procedure: INSERTION PORT-A-CATH;  Surgeon: Autumn Messing III, MD;  Location: [place];  Service: General;  Laterality: Left;  . TONSILECTOMY/ADENOIDECTOMY WITH MYRINGOTOMY    .  TUBAL LIGATION      There were no vitals filed for this visit.      Subjective Assessment - 12/30/16 1351    Subjective Wearing the splint on the left hand just to keep from doing something stupid.   Currently in Pain? Yes   Pain Score 7    Pain Location Finger (Comment which one)   Pain Orientation Left  1st   Pain Descriptors / Indicators Aching   Aggravating Factors  using the hand more   Pain Relieving Factors wearing the splint                         [facility] Adult PT Treatment/Exercise - 12/30/16 0001      Ambulation/Gait   Gait Comments walking with narrow base of support 20 feet forward and 20 feet back; sidestepping to right with mini squats x 16 feet, all without assistive device and with close supervision     Neuro Re-ed    Neuro Re-ed Details  standing with back to corner on foam pad:  stand still with eyes open, then alternating shoulder flexion, then small steps in place, then trunk turns, then hold index card with "A" written on it for head turns with static gaze; stand with back in corner and play ball toss     Knee/Hip Exercises: Seated   Other  Seated Knee/Hip Exercises SLR in sitting with 2 lb. weight on right ankle x 10 right, no weight left with left knee bent x 10, 2 sets each     Shoulder Exercises: Standing   Horizontal ABduction Strengthening;Both;10 reps;Theraband   Theraband Level (Shoulder Horizontal ABduction) Level 2 (Red)   Flexion Strengthening;Both;10 reps;Theraband   Theraband Level (Shoulder Flexion) Level 2 (Red)  wide and narrow grips x 10 each   Other Standing Exercises active D2 each side with red Theraband                PT Education - 12/30/16 1713    Education provided Yes   Education Details balance exercises, standing scapular strengthening vs. red Theraband   Person(s) Educated Patient   Methods Explanation;Demonstration;Verbal cues;Handout   Comprehension Verbalized understanding;Returned demonstration                 [facility] Goals - 12/30/16 1717      CC Long Term Goal  #3   Title Patient will show improvement in safe gait speed with cane.   Status On-going            Plan - 12/30/16 1714    Clinical Impression Statement Patient was given additions to her home exercise program today with focus on balance and standing UE strengthening.  She definitely feels therapy is helping her.  She will not have therapy next week and then in mid August will be away for a while, but plan is to continue therapy.  She would like to be less reliant on her cane.   Rehab Potential Good   PT Frequency 2x / week   PT Duration 6 weeks   PT Treatment/Interventions ADLs/Self Care Home Management;Aquatic Therapy;Gait training;Stair training;Functional mobility training;Therapeutic exercise;Neuromuscular re-education;Cognitive remediation;Patient/family education;Orthotic Fit/Training;Energy conservation   PT Next Visit Plan Continue neuromuscular re-ed and LE strengthening. Recheck Berg soon.   PT Home Exercise Plan standing SLRs, heel raises, balance exercises, standing scapular strengthening vs. red Theraband   Consulted and Agree with Plan of Care Patient      Patient will benefit from skilled therapeutic intervention in order to improve the following deficits and impairments:  Abnormal gait, Decreased balance, Decreased mobility  Visit Diagnosis: Unsteadiness on feet  Other abnormalities of gait and mobility  History of falling  Abnormal posture     Problem List Patient Active Problem List   Diagnosis Date Noted  . Port catheter in place 01/06/2016  . Breast cancer of upper-inner quadrant of right female breast ([place]) 10/16/2015  . Onychomycosis 11/22/2012  . Pain in joint, ankle and foot 11/22/2012  . Hammer toe 11/22/2012  . DIABETES MELLITUS-TYPE II 07/04/2009  . ANEMIA-IRON DEFICIENCY 07/04/2009  . ESOPHAGEAL STRICTURE 07/04/2009  . GERD 07/04/2009  . PERSONAL  HISTORY OF PEPTIC ULCER DISEASE 07/04/2009    Brandis Wixted 12/30/2016, 5:18 PM  [place], [place], [address] Phone: [phone]   Fax:  [phone]  Name: HENSLEY AZIZ MRN: [id number] Date of Birth: Sep 30, 1940  [name], PT 12/30/16 5:19 PM

## 2016-12-30 NOTE — Patient Instructions (Addendum)
Do once a day:  Stand on a pillow with your back in a corner.  (Put a non-slip rug pad under the pillow so it's not slippery.)  1) Just stand still for about a minute, keeping your balance.  2) Take small marching steps in place for about a minute.  3) Raise one arm forward and up, then bring that arm down as you're raising the opposite arm up.  Do 10 raises on each side.  4) Keep your feet planted, but turn your trunk as far as you can as if you're trying to look behind you.  5) Hold the card with the letter "A" on it out at arm's length.  Keep your gaze on that letter, but turn your head side to side, 10 times each way left and right.     DO THE FOLLOWING STANDING UP WITH YOUR BACK TO A WALL:  Over Head Pull: Narrow and Wide Grip   Cancer Rehab [phone]   On back, knees bent, feet flat, band across thighs, elbows straight but relaxed. Pull hands apart (start). Keeping elbows straight, bring arms up and over head, hands toward floor. Keep pull steady on band. Hold momentarily. Return slowly, keeping pull steady, back to start. Then do same with a wider grip on the band (past shoulder width) Repeat _5-10__ times. Band color __red____   Side Pull: Double Arm   On back, knees bent, feet flat. Arms perpendicular to body, shoulder level, elbows straight but relaxed. Pull arms out to sides, elbows straight. Resistance band comes across collarbones, hands toward floor. Hold momentarily. Slowly return to starting position. Repeat _5-10__ times. Band color _red____   Sword   On back, knees bent, feet flat, left hand on left hip, right hand above left. Pull right arm DIAGONALLY (hip to shoulder) across chest. Bring right arm along head toward floor. Hold momentarily. Slowly return to starting position. Repeat _5-10__ times. Do with left arm. Band color _red_____

## 2017-01-06 DIAGNOSIS — M25561 Pain in right knee: Secondary | ICD-10-CM | POA: Diagnosis not present

## 2017-01-06 DIAGNOSIS — M25562 Pain in left knee: Secondary | ICD-10-CM | POA: Diagnosis not present

## 2017-01-06 DIAGNOSIS — M170 Bilateral primary osteoarthritis of knee: Secondary | ICD-10-CM | POA: Diagnosis not present

## 2017-01-06 DIAGNOSIS — M1712 Unilateral primary osteoarthritis, left knee: Secondary | ICD-10-CM | POA: Diagnosis not present

## 2017-01-06 DIAGNOSIS — M1711 Unilateral primary osteoarthritis, right knee: Secondary | ICD-10-CM | POA: Diagnosis not present

## 2017-01-11 ENCOUNTER — Ambulatory Visit: Payer: PPO | Attending: Hematology and Oncology | Admitting: Physical Therapy

## 2017-01-11 DIAGNOSIS — Z9181 History of falling: Secondary | ICD-10-CM | POA: Insufficient documentation

## 2017-01-11 DIAGNOSIS — R2689 Other abnormalities of gait and mobility: Secondary | ICD-10-CM | POA: Insufficient documentation

## 2017-01-11 DIAGNOSIS — Z79899 Other long term (current) drug therapy: Secondary | ICD-10-CM | POA: Diagnosis not present

## 2017-01-11 DIAGNOSIS — E1122 Type 2 diabetes mellitus with diabetic chronic kidney disease: Secondary | ICD-10-CM | POA: Diagnosis not present

## 2017-01-11 DIAGNOSIS — R2681 Unsteadiness on feet: Secondary | ICD-10-CM | POA: Diagnosis not present

## 2017-01-11 DIAGNOSIS — I10 Essential (primary) hypertension: Secondary | ICD-10-CM | POA: Diagnosis not present

## 2017-01-11 DIAGNOSIS — E7800 Pure hypercholesterolemia, unspecified: Secondary | ICD-10-CM | POA: Diagnosis not present

## 2017-01-11 NOTE — Therapy (Signed)
[place], [place], [address] Phone: [phone]   Fax:  [phone]  Physical Therapy Treatment  Patient Details  Name: Shelley Lawrence MRN: [id number] Date of Birth: 06-Nov-1940 Referring Provider: Dr. Nicholas Lose  Encounter Date: 01/11/2017      PT End of Session - 01/11/17 1439    Visit Number 7   Number of Visits 13   Date for PT Re-Evaluation 02/12/17   PT Start Time 7425   PT Stop Time 1435   PT Time Calculation (min) 48 min   Activity Tolerance Patient tolerated treatment well   Behavior During Therapy [facility] for tasks assessed/performed      Past Medical History:  Diagnosis Date  . Allergy   . Anemia   . Arthritis    knees  . Breast cancer ([place]) 10/14/2015   Right Breast  . Broken foot   . Carpal tunnel syndrome   . Diabetes mellitus without complication ([place])   . GERD (gastroesophageal reflux disease)   . History of radiation therapy 03/16/16-04/29/16   Right breast/ 50.4 Gy in 28 fractions and right breast boost / 12 Gy in 6 fractions.  . Hot flashes   . Hypertension     Past Surgical History:  Procedure Laterality Date  . ABDOMINAL HYSTERECTOMY    . BACK SURGERY    . BREAST BIOPSY Right 10/14/2015   U/S Core- Malignant  . BREAST LUMPECTOMY Right 10/30/2015  . BREAST LUMPECTOMY WITH NEEDLE LOCALIZATION AND AXILLARY SENTINEL LYMPH NODE BX Right 10/30/2015   Procedure: BREAST LUMPECTOMY WITH NEEDLE LOCALIZATION AND AXILLARY SENTINEL LYMPH NODE BX;  Surgeon: Autumn Messing III, MD;  Location: [place];  Service: General;  Laterality: Right;  . CARPAL TUNNEL RELEASE Right   . KNEE SURGERY    . OVARY SURGERY     removal of ovarian tumor  . PORTACATH PLACEMENT Left 11/21/2015   Procedure: INSERTION PORT-A-CATH;  Surgeon: Autumn Messing III, MD;  Location: [place];  Service: General;  Laterality: Left;  . TONSILECTOMY/ADENOIDECTOMY WITH MYRINGOTOMY    .  TUBAL LIGATION      There were no vitals filed for this visit.      Subjective Assessment - 01/11/17 1348    Subjective "I've been a bad girl--life got busy, so I didn't get the exercises done." Says she got some exercise just with all her appointments and family activity. Saw the orthopedist who still thinks she needs to have knee surgery (TKA), but she is not ready for that.    Currently in Pain? Yes   Pain Score 6    Pain Location Finger (Comment which one)  1st   Pain Orientation Left   Pain Descriptors / Indicators Aching   Aggravating Factors  using the hand more   Pain Relieving Factors wearing the splint   Multiple Pain Sites Yes   Pain Score 5   Pain Location Knee   Pain Orientation Left;Posterior   Pain Descriptors / Indicators Aching;Other (Comment)  just there   Aggravating Factors  walking on it   Pain Relieving Factors rest                         [facility] Adult PT Treatment/Exercise - 01/11/17 0001      Neuro Re-ed    Neuro Re-ed Details  seated on physioball with therapist contact guard assist:  weight shift side to side and forward/back; alternating shoulder flexion/extension;  small marches; active bilat. shoulder abduction; hands across chest with slight backward leans x10; small bounces.  In standing in corner on foam pad: eyes closed x 30 seconds, small marching stops, trunk turns.  Standing on level floor, ball toss and catch.     Knee/Hip Exercises: Standing   Other Standing Knee Exercises side step with mini dips x 8 feet each way  painful to Baker's cyst left knee   Other Standing Knee Exercises standing alternating toe touch on 6 iinch step x 10 each side     Knee/Hip Exercises: Seated   Other Seated Knee/Hip Exercises SLR in sitting with 2 lb. weight on right ankle x 10 right, no weight left with left knee bent x 10, 2 sets each     Shoulder Exercises: Standing   Horizontal ABduction Strengthening;Both;10 reps;Theraband   Theraband  Level (Shoulder Horizontal ABduction) Level 2 (Red)   Flexion Strengthening;Both;10 reps;Theraband   Theraband Level (Shoulder Flexion) Level 2 (Red)  wide and narrow grips x 10 each   Other Standing Exercises active D2 each side with red Theraband  10 reps                        [facility] Goals - 12/30/16 1717      CC Long Term Goal  #3   Title Patient will show improvement in safe gait speed with cane.   Status On-going            Plan - 01/11/17 1538    Clinical Impression Statement Pt. hasn't done her exercises much since her last visit over a week ago, so Theraband UE exercises were reviewed. She tolerated other balance exercises well today.     Rehab Potential Good   PT Frequency 2x / week   PT Duration 4 weeks   PT Treatment/Interventions ADLs/Self Care Home Management;Aquatic Therapy;Gait training;Stair training;Functional mobility training;Therapeutic exercise;Neuromuscular re-education;Cognitive remediation;Patient/family education;Orthotic Fit/Training;Energy conservation   PT Next Visit Plan Continue neuromuscular re-ed and LE strengthening. Recheck Berg soon.   PT Home Exercise Plan standing SLRs, heel raises, balance exercises, standing scapular strengthening vs. red Theraband   Consulted and Agree with Plan of Care Patient      Patient will benefit from skilled therapeutic intervention in order to improve the following deficits and impairments:  Abnormal gait, Decreased balance, Decreased mobility  Visit Diagnosis: Unsteadiness on feet  Other abnormalities of gait and mobility  History of falling     Problem List Patient Active Problem List   Diagnosis Date Noted  . Port catheter in place 01/06/2016  . Breast cancer of upper-inner quadrant of right female breast ([place]) 10/16/2015  . Onychomycosis 11/22/2012  . Pain in joint, ankle and foot 11/22/2012  . Hammer toe 11/22/2012  . DIABETES MELLITUS-TYPE II 07/04/2009  .  ANEMIA-IRON DEFICIENCY 07/04/2009  . ESOPHAGEAL STRICTURE 07/04/2009  . GERD 07/04/2009  . 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    SALISBURY,DONNA 01/11/2017, 3:42 PM  [place], [place], [address] Phone: [phone]   Fax:  [phone]  Name: LENDY DITTRICH MRN: [id number] Date of Birth: 06-Apr-1941  [name], PT 01/11/17 3:43 PM

## 2017-01-13 ENCOUNTER — Ambulatory Visit: Payer: PPO | Admitting: Physical Therapy

## 2017-01-13 DIAGNOSIS — R2689 Other abnormalities of gait and mobility: Secondary | ICD-10-CM

## 2017-01-13 DIAGNOSIS — Z9181 History of falling: Secondary | ICD-10-CM

## 2017-01-13 DIAGNOSIS — R2681 Unsteadiness on feet: Secondary | ICD-10-CM

## 2017-01-13 NOTE — Therapy (Signed)
[place], [place], [address] Phone: [phone]   Fax:  [phone]  Physical Therapy Treatment  Patient Details  Name: Shelley Lawrence MRN: [id number] Date of Birth: Apr 21, 1941 Referring Provider: Dr. Nicholas Lose  Encounter Date: 01/13/2017      PT End of Session - 01/13/17 1157    Visit Number 8   Number of Visits 13   Date for PT Re-Evaluation 02/12/17   PT Start Time 1106   PT Stop Time 1152   PT Time Calculation (min) 46 min   Activity Tolerance Patient tolerated treatment well   Behavior During Therapy [facility] for tasks assessed/performed      Past Medical History:  Diagnosis Date  . Allergy   . Anemia   . Arthritis    knees  . Breast cancer ([place]) 10/14/2015   Right Breast  . Broken foot   . Carpal tunnel syndrome   . Diabetes mellitus without complication ([place])   . GERD (gastroesophageal reflux disease)   . History of radiation therapy 03/16/16-04/29/16   Right breast/ 50.4 Gy in 28 fractions and right breast boost / 12 Gy in 6 fractions.  . Hot flashes   . Hypertension     Past Surgical History:  Procedure Laterality Date  . ABDOMINAL HYSTERECTOMY    . BACK SURGERY    . BREAST BIOPSY Right 10/14/2015   U/S Core- Malignant  . BREAST LUMPECTOMY Right 10/30/2015  . BREAST LUMPECTOMY WITH NEEDLE LOCALIZATION AND AXILLARY SENTINEL LYMPH NODE BX Right 10/30/2015   Procedure: BREAST LUMPECTOMY WITH NEEDLE LOCALIZATION AND AXILLARY SENTINEL LYMPH NODE BX;  Surgeon: Autumn Messing III, MD;  Location: [place];  Service: General;  Laterality: Right;  . CARPAL TUNNEL RELEASE Right   . KNEE SURGERY    . OVARY SURGERY     removal of ovarian tumor  . PORTACATH PLACEMENT Left 11/21/2015   Procedure: INSERTION PORT-A-CATH;  Surgeon: Autumn Messing III, MD;  Location: [place];  Service: General;  Laterality: Left;  . TONSILECTOMY/ADENOIDECTOMY WITH MYRINGOTOMY    .  TUBAL LIGATION      There were no vitals filed for this visit.      Subjective Assessment - 01/13/17 1109    Subjective I did some exercise yesterday.   Pertinent History Diagnosed with right breast cancer in spring 2017; had lumpectomy (2 nodes removed, both negative), chemo (completed 01/27/16) and radiation.  Completed radiation in mid-November 2017.  Arthritis in knees. HTN but lately it hasn't been high. Diabetes. Carpal tunnel syndrome in left hand (had surgery for right 20 years ago.   Currently in Pain? Yes   Pain Score 6    Pain Location Finger (Comment which one)  1st, and into wrist   Pain Orientation Left   Pain Descriptors / Indicators Aching   Aggravating Factors  using the hand more   Pain Relieving Factors wearing the splint   Pain Score 5   Pain Location Knee   Pain Orientation Left;Posterior                         [facility] Adult PT Treatment/Exercise - 01/13/17 0001      High Level Balance   High Level Balance Activities Braiding;Tandem walking;Side stepping;Head turns;Backward walking;Marching forwards  10 feet each way, then one foot in front only 10' Rt, Lt     Neuro Re-ed    Neuro Re-ed Details  standing on  [employer] foam pad in corner:  eyes closed x 30 seconds, then eyes open and small marches, head turns sideways, head turns up and down, trunk twists, reach forward as far as possible     Knee/Hip Exercises: Standing   Other Standing Knee Exercises standing alternating toe touch on 8 iinch step x 10 each side     Knee/Hip Exercises: Seated   Sit to Sand 10 reps;without UE support  mat at 20 inches, not sitting fully each time     Knee/Hip Exercises: Supine   Straight Leg Raises AROM;Right;10 reps;Left     Knee/Hip Exercises: Sidelying   Hip ABduction AROM;Left;Right;10 reps  right much easier than left                        [facility] Goals - 01/13/17 1159      CC Long Term Goal  #1   Status On-going     CC  Long Term Goal  #2   Title Patient will increase BERG balance score to >/= 50/56 to reduce fall risk   Status On-going     CC Long Term Goal  #3   Title Patient will show improvement in safe gait speed with cane.   Status On-going     CC Long Term Goal  #4   Title Pt. will report at least 50% perceived improvement in her stability while walking.   Status On-going            Plan - 01/13/17 1157    Clinical Impression Statement Pt. did her exercises yesterday at home, so that was better.  She was challenged today with mainly balance and gait activities. She continues to report benefit of therapy. She has a little less need for her walker or cane.   Rehab Potential Good   PT Frequency 2x / week   PT Duration 4 weeks   PT Treatment/Interventions ADLs/Self Care Home Management;Aquatic Therapy;Gait training;Stair training;Functional mobility training;Therapeutic exercise;Neuromuscular re-education;Cognitive remediation;Patient/family education;Orthotic Fit/Training;Energy conservation   PT Next Visit Plan Continue neuromuscular re-ed and LE strengthening. Recheck Berg soon.   PT Home Exercise Plan standing SLRs, heel raises, balance exercises, standing scapular strengthening vs. red Theraband      Patient will benefit from skilled therapeutic intervention in order to improve the following deficits and impairments:  Abnormal gait, Decreased balance, Decreased mobility  Visit Diagnosis: Unsteadiness on feet  Other abnormalities of gait and mobility  History of falling     Problem List Patient Active Problem List   Diagnosis Date Noted  . Port catheter in place 01/06/2016  . Breast cancer of upper-inner quadrant of right female breast ([place]) 10/16/2015  . Onychomycosis 11/22/2012  . Pain in joint, ankle and foot 11/22/2012  . Hammer toe 11/22/2012  . DIABETES MELLITUS-TYPE II 07/04/2009  . ANEMIA-IRON DEFICIENCY 07/04/2009  . ESOPHAGEAL STRICTURE 07/04/2009  . GERD  07/04/2009  . 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    Shelley Lawrence 01/13/2017, 12:01 PM  [place], [place], [address] Phone: [phone]   Fax:  [phone]  Name: Shelley Lawrence MRN: [id number] Date of Birth: 06/02/1941  [name], PT 01/13/17 12:01 PM

## 2017-01-18 ENCOUNTER — Ambulatory Visit: Payer: PPO | Admitting: Physical Therapy

## 2017-01-18 DIAGNOSIS — R2681 Unsteadiness on feet: Secondary | ICD-10-CM | POA: Diagnosis not present

## 2017-01-18 DIAGNOSIS — Z9181 History of falling: Secondary | ICD-10-CM

## 2017-01-18 DIAGNOSIS — R2689 Other abnormalities of gait and mobility: Secondary | ICD-10-CM

## 2017-01-18 NOTE — Therapy (Signed)
[place], [place], [address] Phone: [phone]   Fax:  [phone]  Physical Therapy Treatment  Patient Details  Name: Shelley Lawrence MRN: [id number] Date of Birth: 03-26-1941 Referring Provider: Dr. Nicholas Lose  Encounter Date: 01/18/2017      PT End of Session - 01/18/17 1437    Visit Number 9   Number of Visits 13   Date for PT Re-Evaluation 02/12/17   PT Start Time 1347   PT Stop Time 1431   PT Time Calculation (min) 44 min   Activity Tolerance Patient tolerated treatment well   Behavior During Therapy [facility] for tasks assessed/performed      Past Medical History:  Diagnosis Date  . Allergy   . Anemia   . Arthritis    knees  . Breast cancer ([place]) 10/14/2015   Right Breast  . Broken foot   . Carpal tunnel syndrome   . Diabetes mellitus without complication ([place])   . GERD (gastroesophageal reflux disease)   . History of radiation therapy 03/16/16-04/29/16   Right breast/ 50.4 Gy in 28 fractions and right breast boost / 12 Gy in 6 fractions.  . Hot flashes   . Hypertension     Past Surgical History:  Procedure Laterality Date  . ABDOMINAL HYSTERECTOMY    . BACK SURGERY    . BREAST BIOPSY Right 10/14/2015   U/S Core- Malignant  . BREAST LUMPECTOMY Right 10/30/2015  . BREAST LUMPECTOMY WITH NEEDLE LOCALIZATION AND AXILLARY SENTINEL LYMPH NODE BX Right 10/30/2015   Procedure: BREAST LUMPECTOMY WITH NEEDLE LOCALIZATION AND AXILLARY SENTINEL LYMPH NODE BX;  Surgeon: Autumn Messing III, MD;  Location: [place];  Service: General;  Laterality: Right;  . CARPAL TUNNEL RELEASE Right   . KNEE SURGERY    . OVARY SURGERY     removal of ovarian tumor  . PORTACATH PLACEMENT Left 11/21/2015   Procedure: INSERTION PORT-A-CATH;  Surgeon: Autumn Messing III, MD;  Location: [place];  Service: General;  Laterality: Left;  . TONSILECTOMY/ADENOIDECTOMY WITH MYRINGOTOMY    .  TUBAL LIGATION      There were no vitals filed for this visit.      Subjective Assessment - 01/18/17 1351    Subjective Did a little bit of exercise over the weekend and had to go up steps two days (up to another floor).     Currently in Pain? Yes   Pain Score 7    Pain Location Finger (Comment which one)  1st and down to wrist   Pain Orientation Left   Aggravating Factors  using the hand more   Pain Relieving Factors wearing the splint                         [facility] Adult PT Treatment/Exercise - 01/18/17 0001      Neuro Re-ed    Neuro Re-ed Details  standing on mini tramp:  get balance without UE support, bounce slightly, weight shift side to side, weight shift forward and back each way, move arms into abduction and down, move arms into flexion and extension.     Knee/Hip Exercises: Standing   Heel Raises Both;10 reps;20 reps;1 second  2 lb. weights on ankles   Knee Flexion Strengthening;Right;Left;10 reps  2 lb. weight on each ankle   Hip Flexion Stengthening;Right;Left;10 reps;Knee straight  2 lb. weights   Hip Abduction Stengthening;Right;Left;10 reps;Knee straight  2 lbs.  Hip Extension Stengthening;Right;Left;10 reps;Knee straight  2 lb. weights   Other Standing Knee Exercises At machine on ortho side, resisted walking at 20 lbs.: 1x backward and back, 5x forward and back, 2x to each side and back with close supervision     Knee/Hip Exercises: Seated   Other Seated Knee/Hip Exercises SLR in sitting with 2 lb. weight on right ankle x 10 right, no weight left with left knee bent x 10, 2 sets each                        [facility] Goals - 01/13/17 1159      CC Long Term Goal  #1   Status On-going     CC Long Term Goal  #2   Title Patient will increase BERG balance score to >/= 50/56 to reduce fall risk   Status On-going     CC Long Term Goal  #3   Title Patient will show improvement in safe gait speed with cane.   Status  On-going     CC Long Term Goal  #4   Title Pt. will report at least 50% perceived improvement in her stability while walking.   Status On-going            Plan - 01/18/17 1438    Clinical Impression Statement Pt. did well and was again challenged today both with strengthening and with balance activities, not a hard but a medium challenge   Rehab Potential Good   PT Frequency 2x / week   PT Duration 4 weeks   PT Treatment/Interventions ADLs/Self Care Home Management;Aquatic Therapy;Gait training;Stair training;Functional mobility training;Therapeutic exercise;Neuromuscular re-education;Cognitive remediation;Patient/family education;Orthotic Fit/Training;Energy conservation   PT Next Visit Plan Continue neuromuscular re-ed and LE strengthening. Recheck Berg soon.   PT Home Exercise Plan standing SLRs, heel raises, balance exercises, standing scapular strengthening vs. red Theraband   Consulted and Agree with Plan of Care Patient      Patient will benefit from skilled therapeutic intervention in order to improve the following deficits and impairments:  Abnormal gait, Decreased balance, Decreased mobility  Visit Diagnosis: Unsteadiness on feet  Other abnormalities of gait and mobility  History of falling     Problem List Patient Active Problem List   Diagnosis Date Noted  . Port catheter in place 01/06/2016  . Breast cancer of upper-inner quadrant of right female breast ([place]) 10/16/2015  . Onychomycosis 11/22/2012  . Pain in joint, ankle and foot 11/22/2012  . Hammer toe 11/22/2012  . DIABETES MELLITUS-TYPE II 07/04/2009  . ANEMIA-IRON DEFICIENCY 07/04/2009  . ESOPHAGEAL STRICTURE 07/04/2009  . GERD 07/04/2009  . 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    Akiva Josey 01/18/2017, 2:39 PM  [place], [place], [address] Phone: [phone]   Fax:  [phone]  Name: BRITTANI PURDUM MRN: [id number] Date of Birth: 28-Aug-1940  [name], PT 01/18/17 2:39 PM

## 2017-01-20 ENCOUNTER — Ambulatory Visit: Payer: PPO | Admitting: Physical Therapy

## 2017-01-20 DIAGNOSIS — R2681 Unsteadiness on feet: Secondary | ICD-10-CM

## 2017-01-20 DIAGNOSIS — Z9181 History of falling: Secondary | ICD-10-CM

## 2017-01-20 DIAGNOSIS — R2689 Other abnormalities of gait and mobility: Secondary | ICD-10-CM

## 2017-01-20 NOTE — Therapy (Signed)
[place], [place], [address] Phone: [phone]   Fax:  [phone]  Physical Therapy Treatment  Patient Details  Name: Shelley Lawrence MRN: [id number] Date of Birth: 21-Nov-1940 Referring Provider: Dr. Nicholas Lose  Encounter Date: 01/20/2017      PT End of Session - 01/20/17 1434    Visit Number 10   Number of Visits 13   Date for PT Re-Evaluation 02/12/17   PT Start Time 1347   PT Stop Time 1430   PT Time Calculation (min) 43 min   Activity Tolerance Patient tolerated treatment well   Behavior During Therapy [facility] for tasks assessed/performed      Past Medical History:  Diagnosis Date  . Allergy   . Anemia   . Arthritis    knees  . Breast cancer ([place]) 10/14/2015   Right Breast  . Broken foot   . Carpal tunnel syndrome   . Diabetes mellitus without complication ([place])   . GERD (gastroesophageal reflux disease)   . History of radiation therapy 03/16/16-04/29/16   Right breast/ 50.4 Gy in 28 fractions and right breast boost / 12 Gy in 6 fractions.  . Hot flashes   . Hypertension     Past Surgical History:  Procedure Laterality Date  . ABDOMINAL HYSTERECTOMY    . BACK SURGERY    . BREAST BIOPSY Right 10/14/2015   U/S Core- Malignant  . BREAST LUMPECTOMY Right 10/30/2015  . BREAST LUMPECTOMY WITH NEEDLE LOCALIZATION AND AXILLARY SENTINEL LYMPH NODE BX Right 10/30/2015   Procedure: BREAST LUMPECTOMY WITH NEEDLE LOCALIZATION AND AXILLARY SENTINEL LYMPH NODE BX;  Surgeon: Autumn Messing III, MD;  Location: [place];  Service: General;  Laterality: Right;  . CARPAL TUNNEL RELEASE Right   . KNEE SURGERY    . OVARY SURGERY     removal of ovarian tumor  . PORTACATH PLACEMENT Left 11/21/2015   Procedure: INSERTION PORT-A-CATH;  Surgeon: Autumn Messing III, MD;  Location: [place];  Service: General;  Laterality: Left;  . TONSILECTOMY/ADENOIDECTOMY WITH MYRINGOTOMY    .  TUBAL LIGATION      There were no vitals filed for this visit.      Subjective Assessment - 01/20/17 1349    Subjective I'm tired already today. Has been busy with doing reports that she needs to do.   Currently in Pain? Yes   Pain Score 7    Pain Location Finger (Comment which one)  1st   Pain Orientation Left            [facility] PT Assessment - 01/20/17 0001      Berg Balance Test   Sit to Stand Able to stand without using hands and stabilize independently   Standing Unsupported Able to stand safely 2 minutes   Sitting with Back Unsupported but Feet Supported on Floor or Stool Able to sit safely and securely 2 minutes   Stand to Sit Sits safely with minimal use of hands   Transfers Able to transfer safely, minor use of hands   Standing Unsupported with Eyes Closed Able to stand 10 seconds safely   Standing Ubsupported with Feet Together Able to place feet together independently and stand 1 minute safely   From Standing, Reach Forward with Outstretched Arm Can reach confidently >25 cm (10")   From Standing Position, Pick up Object from Floor Able to pick up shoe safely and easily   From Standing Position, Turn to Look Behind Over  each Shoulder Looks behind from both sides and weight shifts well   Turn 360 Degrees Able to turn 360 degrees safely in 4 seconds or less   Standing Unsupported, Alternately Place Feet on Step/Stool Able to stand independently and safely and complete 8 steps in 20 seconds   Standing Unsupported, One Foot in Front Able to plae foot ahead of the other independently and hold 30 seconds   Standing on One Leg Able to lift leg independently and hold equal to or more than 3 seconds   Total Score 53                     [facility] Adult PT Treatment/Exercise - Jan 21, 2017 0001      Neuro Re-ed    Neuro Re-ed Details  single leg stance with back to corner, on right leg 5x, on left 2x (left uncomfortable); stand in corner for ball toss and catch      Knee/Hip Exercises: Standing   Other Standing Knee Exercises At machine on ortho side, resisted walking at 20 lbs.: 2x backward and back, 5x forward and back, 2x to each side and back with close supervision     Knee/Hip Exercises: Seated   Ball Squeeze 5 counts x 10 reps   Abduction/Adduction  Strengthening;Both;10 reps;2 sets  [facility] Goals - 01/13/17 1159      CC Long Term Goal  #1   Status On-going     CC Long Term Goal  #2   Title Patient will increase BERG balance score to >/= 50/56 to reduce fall risk   Status On-going     CC Long Term Goal  #3   Title Patient will show improvement in safe gait speed with cane.   Status On-going     CC Long Term Goal  #4   Title Pt. will report at least 50% perceived improvement in her stability while walking.   Status On-going            Plan - 21-Jan-2017 1434    Clinical Impression Statement Pt. has made excellent gains on Berg balance test, improving from 42 at initial evaluation to 53/56 today.  She continues to be challenged by different exercises in therapy and will continue for another 4 weeks.   Rehab Potential Good   PT Frequency 2x / week   PT Duration 4 weeks   PT Treatment/Interventions ADLs/Self Care Home Management;Aquatic Therapy;Gait training;Stair training;Functional mobility training;Therapeutic exercise;Neuromuscular re-education;Cognitive remediation;Patient/family education;Orthotic Fit/Training;Energy conservation   PT Next Visit Plan Continue neuromuscular re-ed and LE strengthening. Reassess goals.   PT Home Exercise Plan standing SLRs, heel raises, balance exercises, standing scapular strengthening vs. red Theraband   Consulted and Agree with Plan of Care Patient      Patient will benefit from skilled therapeutic intervention in order to improve the following deficits and impairments:  Abnormal gait, Decreased balance, Decreased mobility  Visit  Diagnosis: Unsteadiness on feet  Other abnormalities of gait and mobility  History of falling       G-Codes - 01-21-2017 1436    Functional Assessment Tool Used (Outpatient Only) Berg balance test (42/56 at eval =75%), 53/56 on 01-21-17 = 95% or 5% impairment   Functional Limitation Mobility: Walking and moving around   Mobility: Walking and Moving Around Current Status ([id number]) At least 1 percent but less  than 20 percent impaired, limited or restricted   Mobility: Walking and Moving Around Goal Status [phone]) At least 1 percent but less than 20 percent impaired, limited or restricted      Problem List Patient Active Problem List   Diagnosis Date Noted  . Port catheter in place 01/06/2016  . Breast cancer of upper-inner quadrant of right female breast ([place]) 10/16/2015  . Onychomycosis 11/22/2012  . Pain in joint, ankle and foot 11/22/2012  . Hammer toe 11/22/2012  . DIABETES MELLITUS-TYPE II 07/04/2009  . ANEMIA-IRON DEFICIENCY 07/04/2009  . ESOPHAGEAL STRICTURE 07/04/2009  . GERD 07/04/2009  . 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    SALISBURY,DONNA 01/20/2017, 2:39 PM  [place], [place], [address] Phone: [phone]   Fax:  [phone]  Name: Shelley Lawrence MRN: [id number] Date of Birth: 05-Jul-1940  [name], PT 01/20/17 2:39 PM

## 2017-01-21 ENCOUNTER — Telehealth: Payer: Self-pay | Admitting: Pharmacist

## 2017-01-21 NOTE — Telephone Encounter (Signed)
Telephone documentation  Study code: [id number]  Spoke with patient over the phone today and reviewed with her the pharmacogenetic information listed below for the four genes of interest of the "Pharmacogenetic analysis of toxicities related to administration of taxanes in breast cancer patients" study. The information below was reviewed with the patient. Offered the patient the option of speaking with a [facility] genetic counselor and she was not interested in making an appointment. Provide my office number to the patient if she had additional questions.  **The buccal swab testing was NOT conducted at a CLIA validated lab. The patient was reminded that the information given was for informational proposes only and should NOT be used to make clinical decisions.   Gene Phenotype  CYP3A4 Normal metabolizer   CYP3A5 Normal metabolizer   SLCO1B1 Normal function  ABCB1 Normal function    Maryama Kuriakose N. Aleksis Jiggetts, PharmD, BCPS Hematology/Oncology Clinical Pharmacist [facility] [phone]  

## 2017-02-01 ENCOUNTER — Ambulatory Visit: Payer: PPO | Admitting: Physical Therapy

## 2017-02-01 DIAGNOSIS — R2681 Unsteadiness on feet: Secondary | ICD-10-CM

## 2017-02-01 DIAGNOSIS — R2689 Other abnormalities of gait and mobility: Secondary | ICD-10-CM

## 2017-02-01 DIAGNOSIS — Z9181 History of falling: Secondary | ICD-10-CM

## 2017-02-01 NOTE — Therapy (Signed)
[place], [place], [address] Phone: [phone]   Fax:  [phone]  Physical Therapy Treatment  Patient Details  Name: Shelley Lawrence MRN: [id number] Date of Birth: December 23, 1940 Referring Provider: Dr. Nicholas Lose  Encounter Date: 02/01/2017      PT End of Session - 02/01/17 1530    Visit Number 11   Number of Visits 13   Date for PT Re-Evaluation 02/12/17   PT Start Time 1435   PT Stop Time 1518   PT Time Calculation (min) 43 min   Activity Tolerance Patient tolerated treatment well   Behavior During Therapy [facility] for tasks assessed/performed      Past Medical History:  Diagnosis Date  . Allergy   . Anemia   . Arthritis    knees  . Breast cancer ([place]) 10/14/2015   Right Breast  . Broken foot   . Carpal tunnel syndrome   . Diabetes mellitus without complication ([place])   . GERD (gastroesophageal reflux disease)   . History of radiation therapy 03/16/16-04/29/16   Right breast/ 50.4 Gy in 28 fractions and right breast boost / 12 Gy in 6 fractions.  . Hot flashes   . Hypertension     Past Surgical History:  Procedure Laterality Date  . ABDOMINAL HYSTERECTOMY    . BACK SURGERY    . BREAST BIOPSY Right 10/14/2015   U/S Core- Malignant  . BREAST LUMPECTOMY Right 10/30/2015  . BREAST LUMPECTOMY WITH NEEDLE LOCALIZATION AND AXILLARY SENTINEL LYMPH NODE BX Right 10/30/2015   Procedure: BREAST LUMPECTOMY WITH NEEDLE LOCALIZATION AND AXILLARY SENTINEL LYMPH NODE BX;  Surgeon: Autumn Messing III, MD;  Location: [place];  Service: General;  Laterality: Right;  . CARPAL TUNNEL RELEASE Right   . KNEE SURGERY    . OVARY SURGERY     removal of ovarian tumor  . PORTACATH PLACEMENT Left 11/21/2015   Procedure: INSERTION PORT-A-CATH;  Surgeon: Autumn Messing III, MD;  Location: [place];  Service: General;  Laterality: Left;  . TONSILECTOMY/ADENOIDECTOMY WITH MYRINGOTOMY    .  TUBAL LIGATION      There were no vitals filed for this visit.      Subjective Assessment - 02/01/17 1435    Subjective I'm worse than before, from all the walking and everything.  My legs hurt. Having allergy problems. "I was able to use the toilet in the hotel! "Walked two long blocks to [employer] in [place].   Currently in Pain? Yes   Pain Score 6    Pain Location Wrist   Pain Orientation Left   Pain Descriptors / Indicators Aching   Aggravating Factors  using the hand more, picking up things, etc.   Pain Relieving Factors splint   Pain Score 7   Pain Location Leg   Pain Orientation Right;Left   Aggravating Factors  having walked more   Pain Relieving Factors rest            [facility] PT Assessment - 02/01/17 0001      Ambulation/Gait   Gait Comments Pt. came in today using her new rollator walker.                     [place] Adult PT Treatment/Exercise - 02/01/17 0001      Self-Care   Self-Care Other Self-Care Comments   Other Self-Care Comments  adjusted patient's new rollator walker up one inch at her request  Neuro Re-ed    Neuro Re-ed Details  single leg stance with back in corner x 4 each foot; seated on physioball, alternating arm swings forward/back x 10, sideways up x 10; the hands across chest and slight backward lean and then pull lup x 10     Elbow Exercises   Elbow Flexion Strengthening;Both;10 reps;Seated   Bar Weights/Barbell (Elbow Flexion) 3 lbs  difficult   Elbow Extension Strengthening;Both;10 reps   Bar Weights/Barbell (Elbow Extension) 2 lbs  one weight held with both hands, weight overhead   Other elbow exercises In chair, physioball held out in front by PT, patient pushes away x 10 against resistance     Knee/Hip Exercises: Standing   Other Standing Knee Exercises Holding physioball out front, mini-squat, then raise ball up overrheadand engage abdominals x 10.     Shoulder Exercises: Seated   Row Both;20  reps;Theraband   Theraband Level (Shoulder Row) Level 2 (Red)   Other Seated Exercises 3-way arm raises with 1 lb. weights x 10 each way; then overhead press 1 lb. each hand x 10     Shoulder Exercises: Standing   Other Standing Exercises wall pushups x 15                        [facility] Goals - 01/13/17 1159      CC Long Term Goal  #1   Status On-going     CC Long Term Goal  #2   Title Patient will increase BERG balance score to >/= 50/56 to reduce fall risk   Status On-going     CC Long Term Goal  #3   Title Patient will show improvement in safe gait speed with cane.   Status On-going     CC Long Term Goal  #4   Title Pt. will report at least 50% perceived improvement in her stability while walking.   Status On-going            Plan - 02/01/17 1531    Clinical Impression Statement Pt. is tired and sore after traveling to [place] recently and doing a lot of walking there.  Legs and wrists were sore.  She was able to do a variety of exercises including some in sitting for her UE strength, as well as some balance and core exercises.   Rehab Potential Good   PT Frequency 2x / week   PT Duration 4 weeks   PT Treatment/Interventions ADLs/Self Care Home Management;Aquatic Therapy;Gait training;Stair training;Functional mobility training;Therapeutic exercise;Neuromuscular re-education;Cognitive remediation;Patient/family education;Orthotic Fit/Training;Energy conservation   PT Next Visit Plan Continue neuromuscular re-ed and LE strengthening. Reassess goals.   PT Home Exercise Plan standing SLRs, heel raises, balance exercises, standing scapular strengthening vs. red Theraband   Consulted and Agree with Plan of Care Patient      Patient will benefit from skilled therapeutic intervention in order to improve the following deficits and impairments:  Abnormal gait, Decreased balance, Decreased mobility  Visit Diagnosis: Unsteadiness on feet  Other  abnormalities of gait and mobility  History of falling     Problem List Patient Active Problem List   Diagnosis Date Noted  . Port catheter in place 01/06/2016  . Breast cancer of upper-inner quadrant of right female breast ([place]) 10/16/2015  . Onychomycosis 11/22/2012  . Pain in joint, ankle and foot 11/22/2012  . Hammer toe 11/22/2012  . DIABETES MELLITUS-TYPE II 07/04/2009  . ANEMIA-IRON DEFICIENCY 07/04/2009  . ESOPHAGEAL STRICTURE 07/04/2009  .  GERD 07/04/2009  . 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    SALISBURY,DONNA 02/01/2017, 3:33 PM  [place], [place], [address] Phone: [phone]   Fax:  [phone]  Name: Shelley Lawrence MRN: [id number] Date of Birth: 29-Oct-1940  [name], PT 02/01/17 3:33 PM

## 2017-02-03 ENCOUNTER — Ambulatory Visit: Payer: PPO | Admitting: Physical Therapy

## 2017-02-03 DIAGNOSIS — R2681 Unsteadiness on feet: Secondary | ICD-10-CM

## 2017-02-03 DIAGNOSIS — R2689 Other abnormalities of gait and mobility: Secondary | ICD-10-CM

## 2017-02-03 NOTE — Therapy (Signed)
[place], [place], [address] Phone: [phone]   Fax:  [phone]  Physical Therapy Treatment  Patient Details  Name: Shelley Lawrence MRN: [id number] Date of Birth: 12-21-1940 Referring Provider: Dr. Nicholas Lose  Encounter Date: 02/03/2017      PT End of Session - 02/03/17 1153    Visit Number 12   Number of Visits 13   Date for PT Re-Evaluation 02/12/17   PT Start Time 1103   PT Stop Time 1147   PT Time Calculation (min) 44 min   Behavior During Therapy [facility] for tasks assessed/performed      Past Medical History:  Diagnosis Date  . Allergy   . Anemia   . Arthritis    knees  . Breast cancer ([place]) 10/14/2015   Right Breast  . Broken foot   . Carpal tunnel syndrome   . Diabetes mellitus without complication ([place])   . GERD (gastroesophageal reflux disease)   . History of radiation therapy 03/16/16-04/29/16   Right breast/ 50.4 Gy in 28 fractions and right breast boost / 12 Gy in 6 fractions.  . Hot flashes   . Hypertension     Past Surgical History:  Procedure Laterality Date  . ABDOMINAL HYSTERECTOMY    . BACK SURGERY    . BREAST BIOPSY Right 10/14/2015   U/S Core- Malignant  . BREAST LUMPECTOMY Right 10/30/2015  . BREAST LUMPECTOMY WITH NEEDLE LOCALIZATION AND AXILLARY SENTINEL LYMPH NODE BX Right 10/30/2015   Procedure: BREAST LUMPECTOMY WITH NEEDLE LOCALIZATION AND AXILLARY SENTINEL LYMPH NODE BX;  Surgeon: Autumn Messing III, MD;  Location: [place];  Service: General;  Laterality: Right;  . CARPAL TUNNEL RELEASE Right   . KNEE SURGERY    . OVARY SURGERY     removal of ovarian tumor  . PORTACATH PLACEMENT Left 11/21/2015   Procedure: INSERTION PORT-A-CATH;  Surgeon: Autumn Messing III, MD;  Location: [place];  Service: General;  Laterality: Left;  . TONSILECTOMY/ADENOIDECTOMY WITH MYRINGOTOMY    . TUBAL LIGATION      There were no vitals filed for  this visit.      Subjective Assessment - 02/03/17 1104    Subjective I do feel better than last time, though legs aren't as good as when I left.   Currently in Pain? Yes   Pain Score 5    Pain Location Wrist   Pain Orientation Left   Pain Descriptors / Indicators Aching   Aggravating Factors  using the hand more, picking things up   Pain Relieving Factors splint                         [facility] Adult PT Treatment/Exercise - 02/03/17 0001      Neuro Re-ed    Neuro Re-ed Details  seated on physioball: move hips in different directions; cross arms on chest and lean back slightly x 10; arms out to one side, then cross over to other side with slight back ward lean; arms start both at one hip and lift up and out to other side, going approx. 8 times each way; small marches; long arc quads x 5 each side. Standing with back to corner, ball toss and catch; standing on Airex foam pad with back to corner, standing with eyes open, eyes closed; on foam pad, head turns sideways and up/down; on foam pad, marching x 30 steps; on foam pad with one foot forward  and the other back, weight shift forward/back going each way;      Elbow Exercises   Elbow Flexion Strengthening;Both;10 reps;Seated   Bar Weights/Barbell (Elbow Flexion) 3 lbs  difficult   Elbow Extension Strengthening;Both;10 reps   Bar Weights/Barbell (Elbow Extension) 2 lbs  one weight held with both hands, weight overhead   Other elbow exercises In chair, physioball held out in front by PT, patient pushes away x 15 against resistance     Shoulder Exercises: Seated   Row Both;20 reps;Theraband   Theraband Level (Shoulder Row) Level 2 (Red)   Other Seated Exercises 3-way arm raises with 1 lb. weights x 10 each way; then overhead press 1 lb. each hand x 10                        Long Term Clinic Goals - 02/03/17 1105      CC Long Term Goal  #1   Title Patient will demonstrate safe home exercise program to  reduce fall risk and improve balance, to include balance re-ed and knee strengthening.   Status On-going     CC Long Term Goal  #2   Title Patient will increase BERG balance score to >/= 50/56 to reduce fall risk   Baseline 42/56 on eval   Status Achieved     CC Long Term Goal  #3   Title Patient will show improvement in safe gait speed with cane.   Status On-going     CC Long Term Goal  #4   Title Pt. will report at least 50% perceived improvement in her stability while walking.   Baseline reports 15% improvement as of 12/28/16; maybe 50% as of 02/03/17   Status Partially Met            Plan - 02/03/17 1154    Clinical Impression Statement Pt. a little less tired today and seemed to do better as a result.  Continues to find strengthening and balance exercises challenging, but feels about 50% improved in her stability with walking, so that goal met or nearly met.   Rehab Potential Good   PT Frequency 2x / week   PT Duration 4 weeks   PT Treatment/Interventions ADLs/Self Care Home Management;Aquatic Therapy;Gait training;Stair training;Functional mobility training;Therapeutic exercise;Neuromuscular re-education;Cognitive remediation;Patient/family education;Orthotic Fit/Training;Energy conservation   PT Next Visit Plan Continue neuromuscular re-ed and LE strengthening. Check gait speed.   PT Home Exercise Plan standing SLRs, heel raises, balance exercises, standing scapular strengthening vs. red Theraband      Patient will benefit from skilled therapeutic intervention in order to improve the following deficits and impairments:  Abnormal gait, Decreased balance, Decreased mobility  Visit Diagnosis: Unsteadiness on feet  Other abnormalities of gait and mobility     Problem List Patient Active Problem List   Diagnosis Date Noted  . Port catheter in place 01/06/2016  . Breast cancer of upper-inner quadrant of right female breast ([place]) 10/16/2015  . Onychomycosis 11/22/2012   . Pain in joint, ankle and foot 11/22/2012  . Hammer toe 11/22/2012  . DIABETES MELLITUS-TYPE II 07/04/2009  . ANEMIA-IRON DEFICIENCY 07/04/2009  . ESOPHAGEAL STRICTURE 07/04/2009  . GERD 07/04/2009  . 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    Shelley Lawrence 02/03/2017, 11:56 AM  [place], [place], [address] Phone: [phone]   Fax:  [phone]  Name: Shelley Lawrence MRN: [id number] Date of Birth: 08-22-1940  [name], PT 02/03/17  11:56 AM

## 2017-02-09 ENCOUNTER — Ambulatory Visit: Payer: PPO | Admitting: Physical Therapy

## 2017-02-12 ENCOUNTER — Ambulatory Visit: Payer: PPO | Attending: Hematology and Oncology | Admitting: Physical Therapy

## 2017-02-12 DIAGNOSIS — Z9181 History of falling: Secondary | ICD-10-CM | POA: Diagnosis not present

## 2017-02-12 DIAGNOSIS — R2681 Unsteadiness on feet: Secondary | ICD-10-CM | POA: Insufficient documentation

## 2017-02-12 DIAGNOSIS — R293 Abnormal posture: Secondary | ICD-10-CM | POA: Diagnosis not present

## 2017-02-12 DIAGNOSIS — R2689 Other abnormalities of gait and mobility: Secondary | ICD-10-CM | POA: Insufficient documentation

## 2017-02-12 NOTE — Therapy (Signed)
[place], [place], [address] Phone: [phone]   Fax:  [phone]  Physical Therapy Treatment  Patient Details  Name: Shelley Lawrence MRN: [id number] Date of Birth: 08-31-1940 Referring Provider: Dr. Nicholas Lawrence  Encounter Date: 02/12/2017      PT End of Session - 02/12/17 1158    Visit Number 13   Number of Visits 29   Date for PT Re-Evaluation 04/07/17   PT Start Time 1105   PT Stop Time 1150   PT Time Calculation (min) 45 min   Activity Tolerance Patient tolerated treatment well   Behavior During Therapy [facility] for tasks assessed/performed      Past Medical History:  Diagnosis Date  . Allergy   . Anemia   . Arthritis    knees  . Breast cancer ([place]) 10/14/2015   Right Breast  . Broken foot   . Carpal tunnel syndrome   . Diabetes mellitus without complication ([place])   . GERD (gastroesophageal reflux disease)   . History of radiation therapy 03/16/16-04/29/16   Right breast/ 50.4 Gy in 28 fractions and right breast boost / 12 Gy in 6 fractions.  . Hot flashes   . Hypertension     Past Surgical History:  Procedure Laterality Date  . ABDOMINAL HYSTERECTOMY    . BACK SURGERY    . BREAST BIOPSY Right 10/14/2015   U/S Core- Malignant  . BREAST LUMPECTOMY Right 10/30/2015  . BREAST LUMPECTOMY WITH NEEDLE LOCALIZATION AND AXILLARY SENTINEL LYMPH NODE BX Right 10/30/2015   Procedure: BREAST LUMPECTOMY WITH NEEDLE LOCALIZATION AND AXILLARY SENTINEL LYMPH NODE BX;  Surgeon: Autumn Messing III, MD;  Location: [place];  Service: General;  Laterality: Right;  . CARPAL TUNNEL RELEASE Right   . KNEE SURGERY    . OVARY SURGERY     removal of ovarian tumor  . PORTACATH PLACEMENT Left 11/21/2015   Procedure: INSERTION PORT-A-CATH;  Surgeon: Autumn Messing III, MD;  Location: [place];  Service: General;  Laterality: Left;  . TONSILECTOMY/ADENOIDECTOMY WITH MYRINGOTOMY    .  TUBAL LIGATION      There were no vitals filed for this visit.      Subjective Assessment - 02/12/17 1107    Subjective "It was definitely the anastrazole that was causing my nose to bleed." It's a very small percentage, but that get that. Now feels she has to decide whether to take it or not; hasn't taken it since she was here three days ago and had to skip treatment because of the nosebleed. Will talk to Dr. Lindi Lawrence soon about this, at her next visit. Nosebleed didn't start again once it finally stopped.  Blood pressure was not elevated.   Currently in Pain? Yes   Pain Score 4    Pain Location Wrist   Pain Orientation Left   Aggravating Factors  using the hand more, picking things up   Pain Relieving Factors splint helps                         [facility] Adult PT Treatment/Exercise - 02/12/17 0001      High Level Balance   High Level Balance Activities Tandem walking   High Level Balance Comments forward and back 20 feet x 2 each; sidestepping to left and right x 20 feet each     Neuro Re-ed    Neuro Re-ed Details  standing with back in corner on tile  floor: one foot forward of the other, raise arms up in "W" x 10 with each foot forward; then single leg stance x 5 each side     Knee/Hip Exercises: Standing   Other Standing Knee Exercises standing with back in corner, foot taps on 8 inch step x 10 each   Other Standing Knee Exercises holding physioball out front, mini squat then lift ball overhead x 10; trunk rotations x 16 total with handing ball to therapist for rotation     Knee/Hip Exercises: Seated   Ball Squeeze 5 counts x 10 reps   Other Seated Knee/Hip Exercises SLR in sitting with 2 lb. weight on right ankle x 10 right, no weight left with left knee bent x 10, 2 sets each   Abduction/Adduction  Strengthening;Both;10 reps;2 sets  green Theraband     Shoulder Exercises: Seated   Row Both;20 reps;Theraband   Theraband Level (Shoulder Row) Level 2 (Red)    Other Seated Exercises 3-way arm raises with 1 lb. weights x 10 each way; then overhead press 1 lb. each hand x 10                        Long Term Clinic Goals - 02/12/17 1202      CC Long Term Goal  #1   Title Patient will demonstrate safe home exercise program to reduce fall risk and improve balance, to include balance re-ed and knee strengthening.   Status On-going     CC Long Term Goal  #2   Title Patient will increase BERG balance score to >/= 50/56 to reduce fall risk   Status Achieved     CC Long Term Goal  #3   Title Patient will show improvement in safe gait speed with cane.   Status On-going     CC Long Term Goal  #4   Title Pt. will report at least 50% perceived improvement in her stability while walking.   Baseline reports 15% improvement as of 12/28/16; maybe 50% as of 02/03/17   Status Partially Met            Plan - 02/12/17 1200    Clinical Impression Statement Pt. had to cancel visit earlier this week because of a lasting nosebleed.  Today she was again challenged by both balance and strengthening exercises.  She will benefit from continued work on these things, with strengthening including UEs, LEs, and trunk.   Rehab Potential Good   PT Frequency 2x / week   PT Duration 8 weeks   PT Treatment/Interventions ADLs/Self Care Home Management;Aquatic Therapy;Gait training;Stair training;Functional mobility training;Therapeutic exercise;Neuromuscular re-education;Cognitive remediation;Patient/family education;Orthotic Fit/Training;Energy conservation   PT Next Visit Plan Continue neuromuscular re-ed; UE, trunk and LE strengthening. Check gait speed. Check on whether HEP needs additions.   PT Home Exercise Plan standing SLRs, heel raises, balance exercises, standing scapular strengthening vs. red Theraband   Consulted and Agree with Plan of Care Patient      Patient will benefit from skilled therapeutic intervention in order to improve the following  deficits and impairments:  Abnormal gait, Decreased balance, Decreased mobility  Visit Diagnosis: Unsteadiness on feet - Plan: PT plan of care cert/re-cert  Other abnormalities of gait and mobility - Plan: PT plan of care cert/re-cert  History of falling - Plan: PT plan of care cert/re-cert     Problem List Patient Active Problem List   Diagnosis Date Noted  . Port catheter in place 01/06/2016  .  Breast cancer of upper-inner quadrant of right female breast ([place]) 10/16/2015  . Onychomycosis 11/22/2012  . Pain in joint, ankle and foot 11/22/2012  . Hammer toe 11/22/2012  . DIABETES MELLITUS-TYPE II 07/04/2009  . ANEMIA-IRON DEFICIENCY 07/04/2009  . ESOPHAGEAL STRICTURE 07/04/2009  . GERD 07/04/2009  . 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    S[place]IS[place],DONNA 02/12/2017, 12:07 PM  [place], [place], [address] Phone: [phone]   Fax:  [phone]  Name: Shelley Lawrence MRN: [id number] Date of Birth: 06-10-1940  [name], PT 02/12/17 12:07 PM

## 2017-02-15 ENCOUNTER — Ambulatory Visit: Payer: PPO | Admitting: Physical Therapy

## 2017-02-15 DIAGNOSIS — R2681 Unsteadiness on feet: Secondary | ICD-10-CM

## 2017-02-15 DIAGNOSIS — R2689 Other abnormalities of gait and mobility: Secondary | ICD-10-CM

## 2017-02-15 NOTE — Therapy (Signed)
[place], [place], [address] Phone: [phone]   Fax:  [phone]  Physical Therapy Treatment  Patient Details  Name: Shelley Lawrence MRN: [id number] Date of Birth: 1940-09-10 Referring Provider: Dr. Nicholas Lose  Encounter Date: 02/15/2017      PT End of Session - 02/15/17 2029    Visit Number 14   Number of Visits 29   Date for PT Re-Evaluation 04/07/17   PT Start Time 1436   PT Stop Time 1519   PT Time Calculation (min) 43 min   Activity Tolerance Patient tolerated treatment well   Behavior During Therapy [facility] for tasks assessed/performed      Past Medical History:  Diagnosis Date  . Allergy   . Anemia   . Arthritis    knees  . Breast cancer ([place]) 10/14/2015   Right Breast  . Broken foot   . Carpal tunnel syndrome   . Diabetes mellitus without complication ([place])   . GERD (gastroesophageal reflux disease)   . History of radiation therapy 03/16/16-04/29/16   Right breast/ 50.4 Gy in 28 fractions and right breast boost / 12 Gy in 6 fractions.  . Hot flashes   . Hypertension     Past Surgical History:  Procedure Laterality Date  . ABDOMINAL HYSTERECTOMY    . BACK SURGERY    . BREAST BIOPSY Right 10/14/2015   U/S Core- Malignant  . BREAST LUMPECTOMY Right 10/30/2015  . BREAST LUMPECTOMY WITH NEEDLE LOCALIZATION AND AXILLARY SENTINEL LYMPH NODE BX Right 10/30/2015   Procedure: BREAST LUMPECTOMY WITH NEEDLE LOCALIZATION AND AXILLARY SENTINEL LYMPH NODE BX;  Surgeon: Autumn Messing III, MD;  Location: [place];  Service: General;  Laterality: Right;  . CARPAL TUNNEL RELEASE Right   . KNEE SURGERY    . OVARY SURGERY     removal of ovarian tumor  . PORTACATH PLACEMENT Left 11/21/2015   Procedure: INSERTION PORT-A-CATH;  Surgeon: Autumn Messing III, MD;  Location: [place];  Service: General;  Laterality: Left;  . TONSILECTOMY/ADENOIDECTOMY WITH MYRINGOTOMY    .  TUBAL LIGATION      There were no vitals filed for this visit.      Subjective Assessment - 02/15/17 1439    Subjective This weather's making me hurt all over.  Everthing's hurting a little bit worse.  Didn't wear the brace yesterday so probably just overused it. Was a little sore after last session.   Currently in Pain? Yes   Pain Score 6    Pain Location Wrist  and knees   Pain Orientation Left   Pain Descriptors / Indicators --  just hurting   Aggravating Factors  weather, overuse   Pain Relieving Factors ibuprofen and tramadol                         [facility] Adult PT Treatment/Exercise - 02/15/17 0001      Neuro Re-ed    Neuro Re-ed Details  In treadmill to use handrails for support, stand on BOSU ball and balance with, then without UE support, then with mild arm sway; this done for several minutes.  Seated on physioball: alternate arm swings x 10; opposite arm raise/knee extension x 5 each; small circles with pelvis; small bounces with arms abducted to 90 degrees; left arm out straight and right arm bent and pointing left, slight lean back, and move arms to opposite side. Standing on Airex foam pad with  eyes closed x approx. 60 seconds; then small steps standing on Airex pads (eyes open).     Elbow Exercises   Elbow Flexion Strengthening;Both;20 reps;Seated   Bar Weights/Barbell (Elbow Flexion) 3 lbs  in each hand, 10 reps x 2   Other elbow exercises In chair, physioball held out in front by PT, patient pushes away x 10 against resistance     Shoulder Exercises: Seated   Other Seated Exercises 3-way arm raises with 1 lb. weights x 10 each way; then overhead press 1 lb. each hand x 10  alternating right/left today                        Long Term Clinic Goals - 02/12/17 1202      CC Long Term Goal  #1   Title Patient will demonstrate safe home exercise program to reduce fall risk and improve balance, to include balance re-ed and knee  strengthening.   Status On-going     CC Long Term Goal  #2   Title Patient will increase BERG balance score to >/= 50/56 to reduce fall risk   Status Achieved     CC Long Term Goal  #3   Title Patient will show improvement in safe gait speed with cane.   Status On-going     CC Long Term Goal  #4   Title Pt. will report at least 50% perceived improvement in her stability while walking.   Baseline reports 15% improvement as of 12/28/16; maybe 50% as of 02/03/17   Status Partially Met            Plan - 02/15/17 2029    Clinical Impression Statement Pt. continues to find therapy exercises she is given challenging, but makes a very good effort and is successful.   Rehab Potential Good   PT Frequency 2x / week   PT Duration 8 weeks   PT Treatment/Interventions ADLs/Self Care Home Management;Aquatic Therapy;Gait training;Stair training;Functional mobility training;Therapeutic exercise;Neuromuscular re-education;Cognitive remediation;Patient/family education;Orthotic Fit/Training;Energy conservation   PT Next Visit Plan Check goals. Continue neuromuscular re-ed; UE, trunk and LE strengthening. Check gait speed. Check on whether HEP needs additions.   PT Home Exercise Plan standing SLRs, heel raises, balance exercises, standing scapular strengthening vs. red Theraband   Consulted and Agree with Plan of Care Patient      Patient will benefit from skilled therapeutic intervention in order to improve the following deficits and impairments:  Abnormal gait, Decreased balance, Decreased mobility  Visit Diagnosis: Unsteadiness on feet  Other abnormalities of gait and mobility     Problem List Patient Active Problem List   Diagnosis Date Noted  . Port catheter in place 01/06/2016  . Breast cancer of upper-inner quadrant of right female breast ([place]) 10/16/2015  . Onychomycosis 11/22/2012  . Pain in joint, ankle and foot 11/22/2012  . Hammer toe 11/22/2012  . DIABETES MELLITUS-TYPE II  07/04/2009  . ANEMIA-IRON DEFICIENCY 07/04/2009  . ESOPHAGEAL STRICTURE 07/04/2009  . GERD 07/04/2009  . 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    [name] 02/15/2017, 8:31 PM  [place], [place], [address] Phone: [phone]   Fax:  [phone]  Name: Shelley Lawrence MRN: [id number] Date of Birth: [birth year]  [name], PT 02/15/17 8:32 PM

## 2017-02-23 ENCOUNTER — Ambulatory Visit: Payer: PPO | Admitting: Physical Therapy

## 2017-02-25 DIAGNOSIS — C50211 Malignant neoplasm of upper-inner quadrant of right female breast: Secondary | ICD-10-CM | POA: Diagnosis not present

## 2017-02-26 ENCOUNTER — Ambulatory Visit: Payer: PPO | Admitting: Physical Therapy

## 2017-02-26 DIAGNOSIS — R2681 Unsteadiness on feet: Secondary | ICD-10-CM

## 2017-02-26 DIAGNOSIS — R2689 Other abnormalities of gait and mobility: Secondary | ICD-10-CM

## 2017-02-26 DIAGNOSIS — R293 Abnormal posture: Secondary | ICD-10-CM

## 2017-02-26 DIAGNOSIS — Z9181 History of falling: Secondary | ICD-10-CM
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
## 2017-03-01 ENCOUNTER — Ambulatory Visit: Payer: PPO | Admitting: Physical Therapy

## 2017-03-01 DIAGNOSIS — Z9181 History of falling: Secondary | ICD-10-CM

## 2017-03-01 DIAGNOSIS — R2681 Unsteadiness on feet: Secondary | ICD-10-CM | POA: Diagnosis not present

## 2017-03-01 DIAGNOSIS — R2689 Other abnormalities of gait and mobility: Secondary | ICD-10-CM
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
## 2017-03-03 ENCOUNTER — Ambulatory Visit: Payer: PPO | Admitting: Physical Therapy

## 2017-03-03 DIAGNOSIS — R2681 Unsteadiness on feet: Secondary | ICD-10-CM | POA: Diagnosis not present

## 2017-03-03 DIAGNOSIS — R293 Abnormal posture: Secondary | ICD-10-CM

## 2017-03-03 NOTE — Therapy (Addendum)
[place], [place], [address] Phone: [phone]   Fax:  [phone]  Physical Therapy Treatment  Patient Details  Name: Shelley Lawrence MRN: [id number] Date of Birth: July 01, 1940 Referring Provider: Dr. Nicholas Lose  Encounter Date: 03/03/2017      PT End of Session - 03/03/17 1556    Visit Number 17  kx   Number of Visits 29   Date for PT Re-Evaluation 04/07/17   PT Start Time 1520   PT Stop Time 1553  shortened session today   PT Time Calculation (min) 33 min   Activity Tolerance Patient tolerated treatment well   Behavior During Therapy [facility] for tasks assessed/performed      Past Medical History:  Diagnosis Date  . Allergy   . Anemia   . Arthritis    knees  . Breast cancer ([place]) 10/14/2015   Right Breast  . Broken foot   . Carpal tunnel syndrome   . Diabetes mellitus without complication ([place])   . GERD (gastroesophageal reflux disease)   . History of radiation therapy 03/16/16-04/29/16   Right breast/ 50.4 Gy in 28 fractions and right breast boost / 12 Gy in 6 fractions.  . Hot flashes   . Hypertension     Past Surgical History:  Procedure Laterality Date  . ABDOMINAL HYSTERECTOMY    . BACK SURGERY    . BREAST BIOPSY Right 10/14/2015   U/S Core- Malignant  . BREAST LUMPECTOMY Right 10/30/2015  . BREAST LUMPECTOMY WITH NEEDLE LOCALIZATION AND AXILLARY SENTINEL LYMPH NODE BX Right 10/30/2015   Procedure: BREAST LUMPECTOMY WITH NEEDLE LOCALIZATION AND AXILLARY SENTINEL LYMPH NODE BX;  Surgeon: Autumn Messing III, MD;  Location: [place];  Service: General;  Laterality: Right;  . CARPAL TUNNEL RELEASE Right   . KNEE SURGERY    . OVARY SURGERY     removal of ovarian tumor  . PORTACATH PLACEMENT Left 11/21/2015   Procedure: INSERTION PORT-A-CATH;  Surgeon: Autumn Messing III, MD;  Location: [place];  Service: General;  Laterality: Left;  .  TONSILECTOMY/ADENOIDECTOMY WITH MYRINGOTOMY    . TUBAL LIGATION      There were no vitals filed for this visit.      Subjective Assessment - 03/03/17 1523    Subjective Left lateral leg across knee joint and at lower leg were hurting a lot after last session. Didn't get much sleep that night.   Currently in Pain? Yes   Pain Score 9    Pain Location Leg   Pain Orientation Left;Lower;Lateral   Pain Descriptors / Indicators Aching   Aggravating Factors  possibly something we did at last therapy session and/or weather   Pain Relieving Factors heating pad, patches                         [facility] Adult PT Treatment/Exercise - 03/03/17 0001      Elbow Exercises   Elbow Flexion Strengthening;Both;10 reps;Seated;Bar weights/barbell   Bar Weights/Barbell (Elbow Flexion) 2 lbs   Elbow Extension Strengthening;Right;Left;10 reps;Supine;Bar weights/barbell   Bar Weights/Barbell (Elbow Extension) 2 lbs     Neck Exercises: Supine   Neck Retraction 5 reps;3 secs     Shoulder Exercises: Supine   Horizontal ABduction Strengthening;Both;10 reps;Theraband   Theraband Level (Shoulder Horizontal ABduction) Level 2 (Red)   External Rotation Strengthening;Both;10 reps;Theraband   Theraband Level (Shoulder External Rotation) Level 2 (Red)   Flexion Strengthening;Both;10 reps;Theraband  narrow  and wide grip with tension on the band   Theraband Level (Shoulder Flexion) Level 2 (Red)   Other Supine Exercises active D2 vs. red Theraband x 10 each side   Other Supine Exercises chest flies 2 lbs. each hand x 10; shoulder extension, 3 counts x 5 ([place] exercise)     Shoulder Exercises: Seated   Other Seated Exercises 3-way arm raises with 1 lb. each hand x 10 each way to shoulder height                        Long Term Clinic Goals - 03/01/17 1522      CC Long Term Goal  #1   Title Patient will demonstrate safe home exercise program to reduce fall risk and improve  balance, to include balance re-ed and knee strengthening.   Status On-going     CC Long Term Goal  #2   Title Patient will increase BERG balance score to >/= 50/56 to reduce fall risk   Status Achieved     CC Long Term Goal  #4   Title Pt. will report at least 50% perceived improvement in her stability while walking.   Baseline reports 15% improvement as of 12/28/16; maybe 50% as of 02/03/17   Status Partially Met            Plan - 03/03/17 1557    Clinical Impression Statement Pt. not feeling well today in that her left leg is hurting a lot and has been since after last session.  Therefore, focus was on UE exercise and staying off her feet.  She tolerated this well but leg continued to hurt.    Rehab Potential Good   PT Frequency 2x / week   PT Duration 8 weeks   PT Treatment/Interventions ADLs/Self Care Home Management;Aquatic Therapy;Gait training;Stair training;Functional mobility training;Therapeutic exercise;Neuromuscular re-education;Cognitive remediation;Patient/family education;Orthotic Fit/Training;Energy conservation   PT Next Visit Plan Continue neuromuscular re-ed; UE, trunk and LE strengthening. Check gait speed. Check on whether HEP needs additions.   PT Home Exercise Plan standing SLRs, heel raises, balance exercises, standing scapular strengthening vs. red Theraband   Consulted and Agree with Plan of Care Patient      Patient will benefit from skilled therapeutic intervention in order to improve the following deficits and impairments:  Abnormal gait, Decreased balance, Decreased mobility  Visit Diagnosis: Abnormal posture     Problem List Patient Active Problem List   Diagnosis Date Noted  . Port catheter in place 01/06/2016  . Breast cancer of upper-inner quadrant of right female breast ([place]) 10/16/2015  . Onychomycosis 11/22/2012  . Pain in joint, ankle and foot 11/22/2012  . Hammer toe 11/22/2012  . DIABETES MELLITUS-TYPE II 07/04/2009  . ANEMIA-IRON  DEFICIENCY 07/04/2009  . ESOPHAGEAL STRICTURE 07/04/2009  . GERD 07/04/2009  . 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    SALISBURY,DONNA 03/03/2017, 3:59 PM  [place], [place], [address] Phone: [phone]   Fax:  [phone]  Name: RAGINA FENTER MRN: [id number] Date of Birth: [birth year]  [name], PT 03/03/17 4:00 PM  PHYSICAL THERAPY DISCHARGE SUMMARY  Visits from Start of Care: 17  Current functional level related to goals / functional outcomes: Goals partially met as noted above. Pt. Had planned to continue therapy but chose not to because of financial concerns.   Remaining deficits: Unknown, as patient stopped coming after this date.   Education / Equipment: Home exercise  program for balance and strengthening. Plan: Patient agrees to discharge.  Patient goals were partially met. Patient is being discharged due to financial reasons.  ?????    [name], PT 09/23/17 2:40 PM

## 2017-03-05 ENCOUNTER — Telehealth: Payer: Self-pay | Admitting: Hematology and Oncology

## 2017-03-05 NOTE — Telephone Encounter (Signed)
Patient called in wanting to add a flush appointment on 10/16

## 2017-03-08 ENCOUNTER — Ambulatory Visit: Payer: PPO | Admitting: Physical Therapy

## 2017-03-10 ENCOUNTER — Ambulatory Visit: Payer: PPO | Admitting: Physical Therapy

## 2017-03-12 DIAGNOSIS — Z79899 Other long term (current) drug therapy: Secondary | ICD-10-CM | POA: Diagnosis not present

## 2017-03-12 DIAGNOSIS — E1122 Type 2 diabetes mellitus with diabetic chronic kidney disease: Secondary | ICD-10-CM | POA: Diagnosis not present

## 2017-03-12 DIAGNOSIS — Z5181 Encounter for therapeutic drug level monitoring: Secondary | ICD-10-CM | POA: Diagnosis not present

## 2017-03-15 ENCOUNTER — Ambulatory Visit: Payer: PPO | Admitting: Physical Therapy

## 2017-03-17 ENCOUNTER — Encounter: Payer: PPO | Admitting: Physical Therapy

## 2017-03-17 DIAGNOSIS — I10 Essential (primary) hypertension: Secondary | ICD-10-CM | POA: Diagnosis not present

## 2017-03-17 DIAGNOSIS — E7800 Pure hypercholesterolemia, unspecified: Secondary | ICD-10-CM | POA: Diagnosis not present

## 2017-03-17 DIAGNOSIS — E1122 Type 2 diabetes mellitus with diabetic chronic kidney disease: Secondary | ICD-10-CM | POA: Diagnosis not present

## 2017-03-22 ENCOUNTER — Encounter: Payer: PPO | Admitting: Physical Therapy

## 2017-03-22 ENCOUNTER — Other Ambulatory Visit: Payer: Self-pay

## 2017-03-22 DIAGNOSIS — C50211 Malignant neoplasm of upper-inner quadrant of right female breast: Secondary | ICD-10-CM

## 2017-03-22 DIAGNOSIS — Z170 Estrogen receptor positive status [ER+]: Principal | ICD-10-CM

## 2017-03-23 ENCOUNTER — Ambulatory Visit (HOSPITAL_BASED_OUTPATIENT_CLINIC_OR_DEPARTMENT_OTHER): Payer: PPO

## 2017-03-23 ENCOUNTER — Other Ambulatory Visit (HOSPITAL_BASED_OUTPATIENT_CLINIC_OR_DEPARTMENT_OTHER): Payer: PPO

## 2017-03-23 ENCOUNTER — Telehealth: Payer: Self-pay | Admitting: Hematology and Oncology

## 2017-03-23 ENCOUNTER — Other Ambulatory Visit: Payer: PPO

## 2017-03-23 ENCOUNTER — Ambulatory Visit (HOSPITAL_BASED_OUTPATIENT_CLINIC_OR_DEPARTMENT_OTHER): Payer: PPO | Admitting: Hematology and Oncology

## 2017-03-23 DIAGNOSIS — C50211 Malignant neoplasm of upper-inner quadrant of right female breast: Secondary | ICD-10-CM

## 2017-03-23 DIAGNOSIS — Z170 Estrogen receptor positive status [ER+]: Principal | ICD-10-CM

## 2017-03-23 DIAGNOSIS — Z95828 Presence of other vascular implants and grafts: Secondary | ICD-10-CM

## 2017-03-23 LAB — COMPREHENSIVE METABOLIC PANEL
ALT: 17 U/L (ref 0–55)
AST: 20 U/L (ref 5–34)
Albumin: 3.6 g/dL (ref 3.5–5.0)
Alkaline Phosphatase: 100 U/L (ref 40–150)
Anion Gap: 8 mEq/L (ref 3–11)
BUN: 14.1 mg/dL (ref 7.0–26.0)
CO2: 28 meq/L (ref 22–29)
Calcium: 9.6 mg/dL (ref 8.4–10.4)
Chloride: 106 mEq/L (ref 98–109)
Creatinine: 0.7 mg/dL (ref 0.6–1.1)
GLUCOSE: 116 mg/dL (ref 70–140)
POTASSIUM: 4.1 meq/L (ref 3.5–5.1)
SODIUM: 141 meq/L (ref 136–145)
TOTAL PROTEIN: 6.6 g/dL (ref 6.4–8.3)
Total Bilirubin: 0.33 mg/dL (ref 0.20–1.20)

## 2017-03-23 LAB — CBC WITH DIFFERENTIAL/PLATELET
BASO%: 0.6 % (ref 0.0–2.0)
Basophils Absolute: 0 10*3/uL (ref 0.0–0.1)
EOS ABS: 0.1 10*3/uL (ref 0.0–0.5)
EOS%: 2.1 % (ref 0.0–7.0)
HCT: 33.1 % — ABNORMAL LOW (ref 34.8–46.6)
HEMOGLOBIN: 10.9 g/dL — AB (ref 11.6–15.9)
LYMPH%: 20.2 % (ref 14.0–49.7)
MCH: 25.8 pg (ref 25.1–34.0)
MCHC: 32.9 g/dL (ref 31.5–36.0)
MCV: 78.5 fL — AB (ref 79.5–101.0)
MONO#: 0.6 10*3/uL (ref 0.1–0.9)
MONO%: 9 % (ref 0.0–14.0)
NEUT%: 68.1 % (ref 38.4–76.8)
NEUTROS ABS: 4.2 10*3/uL (ref 1.5–6.5)
Platelets: 247 10*3/uL (ref 145–400)
RBC: 4.22 10*6/uL (ref 3.70–5.45)
RDW: 14.5 % (ref 11.2–14.5)
WBC: 6.1 10*3/uL (ref 3.9–10.3)
lymph#: 1.2 10*3/uL (ref 0.9–3.3)

## 2017-03-23 MED ORDER — SODIUM CHLORIDE 0.9 % IJ SOLN
10.0000 mL | INTRAMUSCULAR | Status: DC | PRN
  Administered 2017-03-23: 10 mL via INTRAVENOUS
  Filled 2017-03-23: qty 10

## 2017-03-23 MED ORDER — EXEMESTANE 25 MG PO TABS: 25.0000 mg | ORAL_TABLET | Freq: Every day | ORAL | 0 refills | Status: DC

## 2017-03-23 MED ORDER — HEPARIN SOD (PORK) LOCK FLUSH 100 UNIT/ML IV SOLN
500.0000 [IU] | Freq: Once | INTRAVENOUS | Status: AC | PRN
  Administered 2017-03-23: 500 [IU] via INTRAVENOUS
  Filled 2017-03-23: qty 5

## 2017-03-23 NOTE — Telephone Encounter (Signed)
Gave patient avs and calendar with appts per 10/16 los °

## 2017-03-23 NOTE — Assessment & Plan Note (Signed)
Right lumpectomy 10/30/2015: IDC grade 2, 1.4 cm, IG DCIS, margins negative, 0/2 lymph nodes negative, ER 100%, PR 0%, HER-2 negative, Ki-67 60 %, T1 cN0 stage IA Oncotype DX score 30, 20% risk of recurrence  Treatment plan: 1. Adjuvant chemotherapy With Taxotere and Cytoxan 4 started 11/25/2015 completed 01/27/2016 2. Followed by adjuvant radiation therapy completed 03/24/16 3. Followed by adjuvant antiestrogen therapy with Anastrozole 1 mg daily Started January 2018 ------------------------------------------------------------------------------------------------------------------- Anastrozole toxicities: Tolerating it fairly well. Shelley Lawrence has very occasional hot flash. 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 Shelley Lawrence was prescribed Lyrica but Shelley Lawrence did not take it.  Return to clinic in 1 yr for follow-up

## 2017-03-23 NOTE — Progress Notes (Signed)
Patient Care Team: Jani Gravel, MD as PCP - General (Internal Medicine) Jovita Kussmaul, MD as Consulting Physician (General Surgery) Nicholas Lose, MD as Consulting Physician (Oncology) Gery Pray, MD as Consulting Physician (Radiation Oncology) Delice Bison Charlestine Massed, NP as Nurse Practitioner (Hematology and Oncology)  DIAGNOSIS:  Encounter Diagnosis  Name Primary?  . Malignant neoplasm of upper-inner quadrant of right breast in female, estrogen receptor positive ([place])     SUMMARY OF ONCOLOGIC HISTORY:   Breast cancer of upper-inner quadrant of right female breast ([place])   10/14/2015 Initial Diagnosis    Screening detected Right breast mass at 1:00:1.1 x 0.8 x 0.7 cm,grade 2 IDC plus DCIS, ER 100 present, PR 0%, HER-2 negative ratio 1.64, Ki-67 60%, T1cN0 stage IA      10/30/2015 Surgery    Right lumpectomy: IDC grade 2, 1.4 cm, IG DCIS, margins negative, 0/2 lymph nodes negative, ER 100%, PR 0%, HER-2 negative, Ki-67 60 %, T1 cN0 stage IA, Oncotype DX score 30, 20% risk of recurrence      11/25/2015 - 01/27/2016 Chemotherapy    Adjuvant chemotherapy with Taxotere and Cytoxan every 3 weeks 4 cycles      03/16/2016 - 04/29/2016 Radiation Therapy     Adj XRT      06/09/2016 -  Anti-estrogen oral therapy    Anastrozole 1 mg by mouth daily       CHIEF COMPLIANT: inability to tolerate both letrozole anastrozole therapy  INTERVAL HISTORY: Shelley Lawrence is a 76 year old with above-mentioned history of right breast cancer treated with lumpectomy followed by adjuvant chemotherapy and radiationand was put on oral antiestrogen therapy. She was initially on letrozole at that was switched to anastrozole. In spite of that she continues to have problems from it. She complains of severe hot flashes as well as severe arthralgias and myalgias.she has not been taking any antiestrogen medication for the past 2 weeks.she feels that the hot flashes have improved and the muscle aches and pains  have also improved slightly.  REVIEW OF SYSTEMS:   Constitutional: Denies fevers, chills or abnormal weight loss Eyes: Denies blurriness of vision Ears, nose, mouth, throat, and face: Denies mucositis or sore throat Respiratory: Denies cough, dyspnea or wheezes Cardiovascular: Denies palpitation, chest discomfort Gastrointestinal:  Denies nausea, heartburn or change in bowel habits Skin: Denies abnormal skin rashes Lymphatics: Denies new lymphadenopathy or easy bruising Neurological:Denies numbness, tingling or new weaknesses Behavioral/Psych: Mood is stable, no new changes  Extremities: No lower extremity edema Breast: bilateral lower extremity pain All other systems were reviewed with the patient and are negative.  I have reviewed the past medical history, past surgical history, social history and family history with the patient and they are unchanged from previous note.  ALLERGIES:  is allergic to horse-derived products.  MEDICATIONS:  Current Outpatient Prescriptions  Medication Sig Dispense Refill  . aspirin 81 MG tablet Take 81 mg by mouth daily.    Marland Kitchen atorvastatin (LIPITOR) 40 MG tablet Take 40 mg by mouth daily.    Marland Kitchen b complex vitamins capsule Take 1 capsule by mouth daily.    . brimonidine-timolol (COMBIGAN) 0.2-0.5 % ophthalmic solution Place 1 drop into the left eye every 12 (twelve) hours.    . cycloSPORINE (RESTASIS) 0.05 % ophthalmic emulsion Place 1 drop into both eyes 2 (two) times daily.    Marland Kitchen esomeprazole (NEXIUM) 40 MG capsule Take 1 capsule (40 mg total) by mouth daily before breakfast. 30 capsule 1  . exemestane (AROMASIN) 25 MG tablet  Take 1 tablet (25 mg total) by mouth daily after breakfast. 30 tablet 0  . fexofenadine (ALLEGRA) 180 MG tablet Take 180 mg by mouth daily.    . fluticasone (VERAMYST) 27.5 MCG/SPRAY nasal spray Place 2 sprays into the nose daily.    Marland Kitchen guaiFENesin (MUCINEX) 600 MG 12 hr tablet Take by mouth 2 (two) times daily.    Marland Kitchen guaiFENesin  (ROBITUSSIN) 100 MG/5ML liquid Take 200 mg by mouth 3 (three) times daily as needed for cough.    Marland Kitchen ibuprofen (ADVIL,MOTRIN) 400 MG tablet Take 400 mg by mouth every 6 (six) hours as needed.    . latanoprost (XALATAN) 0.005 % ophthalmic solution Place 1 drop into both eyes at bedtime.    Marland Kitchen losartan (COZAAR) 25 MG tablet Take 25 mg by mouth daily.    . metFORMIN (GLUCOPHAGE-XR) 500 MG 24 hr tablet Take 500 mg by mouth daily with breakfast.     . Multiple Vitamins-Minerals (MULTIVITAMIN WITH MINERALS) tablet Take 1 tablet by mouth daily.    Marland Kitchen nystatin (MYCOSTATIN/NYSTOP) powder Apply topically 2 (two) times daily. 15 g 0  . ondansetron (ZOFRAN) 8 MG tablet TAKE ONE TABLET BY MOUTH TWICE DAILY AS NEEDED FOR  REFRACTORY  NAUSEA/VOMITING.  START  ON  DAY  3  AFTER  CHEMO (Patient not taking: Reported on 06/11/2016) 30 tablet 0  . ONETOUCH VERIO test strip     . Specialty Vitamins Products (MAGNESIUM, AMINO ACID CHELATE,) 133 MG tablet Take 1 tablet by mouth. '250mg'$     . traMADol (ULTRAM) 50 MG tablet Take 50 mg by mouth every 8 (eight) hours as needed.      No current facility-administered medications for this visit.     PHYSICAL EXAMINATION: ECOG PERFORMANCE STATUS: 2 - Symptomatic, <50% confined to bed  Vitals:   03/23/17 1038  BP: (!) 150/73  Pulse: 71  Resp: 20  Temp: 98.2 F (36.8 C)  SpO2: 100%   Filed Weights   03/23/17 1038  Weight: 213 lb 6.4 oz (96.8 kg)    GENERAL:alert, no distress and comfortable SKIN: skin color, texture, turgor are normal, no rashes or significant lesions EYES: normal, Conjunctiva are pink and non-injected, sclera clear OROPHARYNX:no exudate, no erythema and lips, buccal mucosa, and tongue normal  NECK: supple, thyroid normal size, non-tender, without nodularity LYMPH:  no palpable lymphadenopathy in the cervical, axillary or inguinal LUNGS: clear to auscultation and percussion with normal breathing effort HEART: regular rate & rhythm and no murmurs and  no lower extremity edema ABDOMEN:abdomen soft, non-tender and normal bowel sounds MUSCULOSKELETAL:no cyanosis of digits and no clubbing  NEURO: alert & oriented x 3 with fluent speech, no focal motor/sensory deficits EXTREMITIES: No lower extremity edema  LABORATORY DATA:  I have reviewed the data as listed   Chemistry      Component Value Date/Time   NA 141 03/23/2017 0958   K 4.1 03/23/2017 0958   CO2 28 03/23/2017 0958   BUN 14.1 03/23/2017 0958   CREATININE 0.7 03/23/2017 0958      Component Value Date/Time   CALCIUM 9.6 03/23/2017 0958   ALKPHOS 100 03/23/2017 0958   AST 20 03/23/2017 0958   ALT 17 03/23/2017 0958   BILITOT 0.33 03/23/2017 0958       Lab Results  Component Value Date   WBC 6.1 03/23/2017   HGB 10.9 (L) 03/23/2017   HCT 33.1 (L) 03/23/2017   MCV 78.5 (L) 03/23/2017   PLT 247 03/23/2017   NEUTROABS 4.2 03/23/2017  ASSESSMENT & PLAN:  Breast cancer of upper-inner quadrant of right female breast ([place]) Right lumpectomy 10/30/2015: IDC grade 2, 1.4 cm, IG DCIS, margins negative, 0/2 lymph nodes negative, ER 100%, PR 0%, HER-2 negative, Ki-67 60 %, T1 cN0 stage IA Oncotype DX score 30, 20% risk of recurrence  Treatment plan: 1. Adjuvant chemotherapy With Taxotere and Cytoxan 4 started 11/25/2015 completed 01/27/2016 2. Followed by adjuvant radiation therapy completed 03/24/16 3. Followed by adjuvant antiestrogen therapy with Anastrozole 1 mg daily Started January 2018, switched to letrozole, switch to exemestane 03/23/2017 ------------------------------------------------------------------------------------------------------------------- Anastrozole toxicities: Inability to tolerate any antiestrogen therapy with anastrozole and letrozole. We will try exemestane as her own family member is currently taking it and doing well.. 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 She was prescribed Lyrica but she did not take  it.  Return to clinic in 1 yr for follow-up  I spent 25 minutes talking to the patient of which more than half was spent in counseling and coordination of care.  No orders of the defined types were placed in this encounter.  The patient has a good understanding of the overall plan. she agrees with it. she will call with any problems that may develop before the next visit here.   Rulon Eisenmenger, MD 03/23/17

## 2017-03-24 ENCOUNTER — Encounter: Payer: PPO | Admitting: Physical Therapy

## 2017-03-29 ENCOUNTER — Encounter: Payer: PPO | Admitting: Physical Therapy

## 2017-03-31 ENCOUNTER — Encounter: Payer: PPO | Admitting: Physical Therapy

## 2017-04-05 ENCOUNTER — Encounter: Payer: PPO | Admitting: Physical Therapy

## 2017-04-07 ENCOUNTER — Encounter: Payer: PPO | Admitting: Physical Therapy

## 2017-04-16 DIAGNOSIS — G8929 Other chronic pain: Secondary | ICD-10-CM | POA: Diagnosis not present

## 2017-04-16 DIAGNOSIS — M25562 Pain in left knee: Secondary | ICD-10-CM | POA: Diagnosis not present

## 2017-04-16 DIAGNOSIS — M170 Bilateral primary osteoarthritis of knee: Secondary | ICD-10-CM | POA: Diagnosis not present

## 2017-04-16 DIAGNOSIS — M25561 Pain in right knee: Secondary | ICD-10-CM | POA: Diagnosis not present

## 2017-04-16 DIAGNOSIS — M1711 Unilateral primary osteoarthritis, right knee: Secondary | ICD-10-CM | POA: Diagnosis not present

## 2017-05-05 DIAGNOSIS — C50211 Malignant neoplasm of upper-inner quadrant of right female breast: Secondary | ICD-10-CM | POA: Diagnosis not present

## 2017-05-21 ENCOUNTER — Other Ambulatory Visit: Payer: Self-pay | Admitting: General Surgery

## 2017-05-21 DIAGNOSIS — N63 Unspecified lump in unspecified breast: Secondary | ICD-10-CM

## 2017-05-28 ENCOUNTER — Ambulatory Visit
Admission: RE | Admit: 2017-05-28 | Discharge: 2017-05-28 | Disposition: A | Discharge status: Home / Self Care | Payer: PPO | Source: Ambulatory Visit | Attending: General Surgery | Admitting: General Surgery

## 2017-05-28 DIAGNOSIS — N63 Unspecified lump in unspecified breast: Secondary | ICD-10-CM

## 2017-05-28 DIAGNOSIS — N6489 Other specified disorders of breast: Secondary | ICD-10-CM | POA: Diagnosis not present

## 2017-05-28 DIAGNOSIS — R928 Other abnormal and inconclusive findings on diagnostic imaging of breast: Secondary | ICD-10-CM | POA: Diagnosis not present

## 2017-06-28 NOTE — Assessment & Plan Note (Signed)
Right lumpectomy 10/30/2015: IDC grade 2, 1.4 cm, IG DCIS, margins negative, 0/2 lymph nodes negative, ER 100%, PR 0%, HER-2 negative, Ki-67 60 %, T1 cN0 stage IA Oncotype DX score 30, 20% risk of recurrence  Treatment plan: 1. Adjuvant chemotherapy With Taxotere and Cytoxan 4 started 11/25/2015 completed 01/27/2016 2. Followed by adjuvant radiation therapy completed 03/24/16 3. Followed by adjuvant antiestrogen therapy with Anastrozole 1 mg daily Started January 2018, switched to letrozole, switch to exemestane 03/23/2017 ------------------------------------------------------------------------------------------------------------------- Exemestanetoxicities: Inability to tolerate any antiestrogen therapy with anastrozole and letrozole.    Lower extremity pain:Related to neuropathy much improving slowly over time. She was prescribed Lyrica but she did not take it.  Return to clinic in 1 yr for follow-up

## 2017-06-29 ENCOUNTER — Inpatient Hospital Stay: Payer: PPO | Attending: Hematology and Oncology | Admitting: Hematology and Oncology

## 2017-06-29 ENCOUNTER — Inpatient Hospital Stay: Payer: PPO

## 2017-06-29 ENCOUNTER — Telehealth: Payer: Self-pay | Admitting: Hematology and Oncology

## 2017-06-29 DIAGNOSIS — Z853 Personal history of malignant neoplasm of breast: Secondary | ICD-10-CM | POA: Diagnosis not present

## 2017-06-29 DIAGNOSIS — Z170 Estrogen receptor positive status [ER+]: Secondary | ICD-10-CM | POA: Diagnosis not present

## 2017-06-29 DIAGNOSIS — Z9221 Personal history of antineoplastic chemotherapy: Secondary | ICD-10-CM | POA: Insufficient documentation

## 2017-06-29 DIAGNOSIS — Z923 Personal history of irradiation: Secondary | ICD-10-CM | POA: Diagnosis not present

## 2017-06-29 DIAGNOSIS — C50211 Malignant neoplasm of upper-inner quadrant of right female breast: Secondary | ICD-10-CM

## 2017-06-29 DIAGNOSIS — Z95828 Presence of other vascular implants and grafts: Secondary | ICD-10-CM

## 2017-06-29 MED ORDER — SODIUM CHLORIDE 0.9 % IJ SOLN
10.0000 mL | INTRAMUSCULAR | Status: DC | PRN
  Administered 2017-06-29: 10 mL via INTRAVENOUS
  Filled 2017-06-29: qty 10

## 2017-06-29 MED ORDER — HEPARIN SOD (PORK) LOCK FLUSH 100 UNIT/ML IV SOLN
500.0000 [IU] | Freq: Once | INTRAVENOUS | Status: AC | PRN
  Administered 2017-06-29: 500 [IU] via INTRAVENOUS
  Filled 2017-06-29: qty 5

## 2017-06-29 NOTE — Telephone Encounter (Signed)
Gave patient AVs and calendar of upcoming January 2020 appointments.  °

## 2017-06-29 NOTE — Progress Notes (Signed)
Patient Care Team: Jani Gravel, MD as PCP - General (Internal Medicine) Jovita Kussmaul, MD as Consulting Physician (General Surgery) Nicholas Lose, MD as Consulting Physician (Oncology) Gery Pray, MD as Consulting Physician (Radiation Oncology) Delice Bison Charlestine Massed, NP as Nurse Practitioner (Hematology and Oncology)  DIAGNOSIS:  Encounter Diagnosis  Name Primary?  . Malignant neoplasm of upper-inner quadrant of right breast in female, estrogen receptor positive ([place])     SUMMARY OF ONCOLOGIC HISTORY:   Breast cancer of upper-inner quadrant of right female breast ([place])   10/14/2015 Initial Diagnosis    Screening detected Right breast mass at 1:00:1.1 x 0.8 x 0.7 cm,grade 2 IDC plus DCIS, ER 100 present, PR 0%, HER-2 negative ratio 1.64, Ki-67 60%, T1cN0 stage IA      10/30/2015 Surgery    Right lumpectomy: IDC grade 2, 1.4 cm, IG DCIS, margins negative, 0/2 lymph nodes negative, ER 100%, PR 0%, HER-2 negative, Ki-67 60 %, T1 cN0 stage IA, Oncotype DX score 30, 20% risk of recurrence      11/25/2015 - 01/27/2016 Chemotherapy    Adjuvant chemotherapy with Taxotere and Cytoxan every 3 weeks 4 cycles      03/16/2016 - 04/29/2016 Radiation Therapy     Adj XRT      06/09/2016 -  Anti-estrogen oral therapy    Anastrozole 1 mg by mouth daily       CHIEF COMPLIANT: Follow-up on exemestane therapy  INTERVAL HISTORY: Shelley Lawrence is a 77 year old with above-mentioned history of right breast cancer treated with lumpectomy followed by adjuvant chemotherapy and radiation is currently on exemestane therapy.  She could not tolerate either anastrozole or letrozole.  She was prescribed exemestane but she did not take it because she read the package insert and did not want to deal with the side effects anymore.  She denies any lumps or nodules in the breast.  She had a recent mammogram and a biopsy showing fat necrosis  REVIEW OF SYSTEMS:   Constitutional: Denies fevers, chills or  abnormal weight loss Eyes: Denies blurriness of vision Ears, nose, mouth, throat, and face: Denies mucositis or sore throat Respiratory: Denies cough, dyspnea or wheezes Cardiovascular: Denies palpitation, chest discomfort Gastrointestinal:  Denies nausea, heartburn or change in bowel habits Skin: Denies abnormal skin rashes Lymphatics: Denies new lymphadenopathy or easy bruising Neurological: Neuropathy is significantly better, uses a walker to get around Behavioral/Psych: Mood is stable, no new changes  Extremities: No lower extremity edema Breast:  denies any pain or lumps or nodules in either breasts All other systems were reviewed with the patient and are negative.  I have reviewed the past medical history, past surgical history, social history and family history with the patient and they are unchanged from previous note.  ALLERGIES:  is allergic to horse-derived products.  MEDICATIONS:  Current Outpatient Medications  Medication Sig Dispense Refill  . aspirin 81 MG tablet Take 81 mg by mouth daily.    Marland Kitchen atorvastatin (LIPITOR) 40 MG tablet Take 40 mg by mouth daily.    Marland Kitchen b complex vitamins capsule Take 1 capsule by mouth daily.    . brimonidine-timolol (COMBIGAN) 0.2-0.5 % ophthalmic solution Place 1 drop into the left eye every 12 (twelve) hours.    . cycloSPORINE (RESTASIS) 0.05 % ophthalmic emulsion Place 1 drop into both eyes 2 (two) times daily.    Marland Kitchen esomeprazole (NEXIUM) 40 MG capsule Take 1 capsule (40 mg total) by mouth daily before breakfast. 30 capsule 1  . exemestane (AROMASIN)  25 MG tablet Take 1 tablet (25 mg total) by mouth daily after breakfast. 30 tablet 0  . fexofenadine (ALLEGRA) 180 MG tablet Take 180 mg by mouth daily.    . fluticasone (VERAMYST) 27.5 MCG/SPRAY nasal spray Place 2 sprays into the nose daily.    Marland Kitchen guaiFENesin (MUCINEX) 600 MG 12 hr tablet Take by mouth 2 (two) times daily.    Marland Kitchen guaiFENesin (ROBITUSSIN) 100 MG/5ML liquid Take 200 mg by mouth 3  (three) times daily as needed for cough.    Marland Kitchen ibuprofen (ADVIL,MOTRIN) 400 MG tablet Take 400 mg by mouth every 6 (six) hours as needed.    . latanoprost (XALATAN) 0.005 % ophthalmic solution Place 1 drop into both eyes at bedtime.    Marland Kitchen losartan (COZAAR) 25 MG tablet Take 25 mg by mouth daily.    . metFORMIN (GLUCOPHAGE-XR) 500 MG 24 hr tablet Take 500 mg by mouth daily with breakfast.     . Multiple Vitamins-Minerals (MULTIVITAMIN WITH MINERALS) tablet Take 1 tablet by mouth daily.    Marland Kitchen nystatin (MYCOSTATIN/NYSTOP) powder Apply topically 2 (two) times daily. 15 g 0  . ondansetron (ZOFRAN) 8 MG tablet TAKE ONE TABLET BY MOUTH TWICE DAILY AS NEEDED FOR  REFRACTORY  NAUSEA/VOMITING.  START  ON  DAY  3  AFTER  CHEMO (Patient not taking: Reported on 06/11/2016) 30 tablet 0  . ONETOUCH VERIO test strip     . Specialty Vitamins Products (MAGNESIUM, AMINO ACID CHELATE,) 133 MG tablet Take 1 tablet by mouth. '250mg'$     . traMADol (ULTRAM) 50 MG tablet Take 50 mg by mouth every 8 (eight) hours as needed.      No current facility-administered medications for this visit.    Facility-Administered Medications Ordered in Other Visits  Medication Dose Route Frequency Provider Last Rate Last Dose  . heparin lock flush 100 unit/mL  500 Units Intravenous Once PRN Nicholas Lose, MD      . sodium chloride 0.9 % injection 10 mL  10 mL Intravenous PRN Nicholas Lose, MD        PHYSICAL EXAMINATION: ECOG PERFORMANCE STATUS: 1 - Symptomatic but completely ambulatory  Vitals:   06/29/17 1111  Pulse: 92  Resp: 18  Temp: 98.7 F (37.1 C)  SpO2: 99%   Filed Weights   06/29/17 1111  Weight: 214 lb 12.8 oz (97.4 kg)    GENERAL:alert, no distress and comfortable SKIN: skin color, texture, turgor are normal, no rashes or significant lesions EYES: normal, Conjunctiva are pink and non-injected, sclera clear OROPHARYNX:no exudate, no erythema and lips, buccal mucosa, and tongue normal  NECK: supple, thyroid normal  size, non-tender, without nodularity LYMPH:  no palpable lymphadenopathy in the cervical, axillary or inguinal LUNGS: clear to auscultation and percussion with normal breathing effort HEART: regular rate & rhythm and no murmurs and no lower extremity edema ABDOMEN:abdomen soft, non-tender and normal bowel sounds MUSCULOSKELETAL:no cyanosis of digits and no clubbing  NEURO: alert & oriented x 3 with fluent speech, no focal motor/sensory deficits EXTREMITIES: No lower extremity edema BREAST: No palpable masses or nodules in either right or left breasts. No palpable axillary supraclavicular or infraclavicular adenopathy no breast tenderness or nipple discharge. (exam performed in the presence of a chaperone)  LABORATORY DATA:  I have reviewed the data as listed CMP Latest Ref Rng & Units 03/23/2017 04/27/2016 01/27/2016  Glucose 70 - 140 mg/dl 116 138 128  BUN 7.0 - 26.0 mg/dL 14.1 12.3 18.4  Creatinine 0.6 - 1.1 mg/dL 0.7  0.6 0.7  Sodium 136 - 145 mEq/L 141 140 141  Potassium 3.5 - 5.1 mEq/L 4.1 3.9 3.7  CO2 22 - 29 mEq/L '28 26 29  '$ Calcium 8.4 - 16.9 mg/dL 9.6 9.5 9.2  Total Protein 6.4 - 8.3 g/dL 6.6 6.8 5.8(L)  Total Bilirubin 0.20 - 1.20 mg/dL 0.33 0.25 0.31  Alkaline Phos 40 - 150 U/L 100 92 87  AST 5 - 34 U/L '20 21 19  '$ ALT 0 - 55 U/L '17 20 16    '$ Lab Results  Component Value Date   WBC 6.1 03/23/2017   HGB 10.9 (L) 03/23/2017   HCT 33.1 (L) 03/23/2017   MCV 78.5 (L) 03/23/2017   PLT 247 03/23/2017   NEUTROABS 4.2 03/23/2017    ASSESSMENT & PLAN:  Breast cancer of upper-inner quadrant of right female breast ([place]) Right lumpectomy 10/30/2015: IDC grade 2, 1.4 cm, IG DCIS, margins negative, 0/2 lymph nodes negative, ER 100%, PR 0%, HER-2 negative, Ki-67 60 %, T1 cN0 stage IA Oncotype DX score 30, 20% risk of recurrence  Treatment plan: 1. Adjuvant chemotherapy With Taxotere and Cytoxan 4 started 11/25/2015 completed 01/27/2016 2. Followed by adjuvant radiation therapy  completed 03/24/16 3. Followed by adjuvant antiestrogen therapy with Anastrozole 1 mg daily Started January 2018, switched to letrozole, prescribed exemestane 03/23/2017 but she did not take it ------------------------------------------------------------------------------------------------------------------- Surveillance: 1.  Mammogram December 2018: Fat necrosis 2. breast exam 06/29/2017: Benign   Chemo-induced peripheral neuropathy: Nearly resolved   Return to clinic in 1 yr for follow-up  I spent 25 minutes talking to the patient of which more than half was spent in counseling and coordination of care.  No orders of the defined types were placed in this encounter.  The patient has a good understanding of the overall plan. she agrees with it. she will call with any problems that may develop before the next visit here.   Harriette Ohara, MD 06/29/17

## 2017-07-01 ENCOUNTER — Ambulatory Visit: Payer: Self-pay | Admitting: General Surgery

## 2017-07-08 DIAGNOSIS — E119 Type 2 diabetes mellitus without complications: Secondary | ICD-10-CM | POA: Diagnosis not present

## 2017-07-08 DIAGNOSIS — H401132 Primary open-angle glaucoma, bilateral, moderate stage: Secondary | ICD-10-CM | POA: Diagnosis not present

## 2017-07-22 DIAGNOSIS — M170 Bilateral primary osteoarthritis of knee: Secondary | ICD-10-CM | POA: Diagnosis not present

## 2017-07-22 DIAGNOSIS — M25561 Pain in right knee: Secondary | ICD-10-CM | POA: Diagnosis not present

## 2017-07-22 DIAGNOSIS — M25562 Pain in left knee: Secondary | ICD-10-CM | POA: Diagnosis not present

## 2017-08-04 DIAGNOSIS — C50211 Malignant neoplasm of upper-inner quadrant of right female breast: Secondary | ICD-10-CM | POA: Diagnosis not present

## 2017-09-28 DIAGNOSIS — I10 Essential (primary) hypertension: Secondary | ICD-10-CM | POA: Diagnosis not present

## 2017-09-28 DIAGNOSIS — E1122 Type 2 diabetes mellitus with diabetic chronic kidney disease: Secondary | ICD-10-CM | POA: Diagnosis not present

## 2017-09-28 DIAGNOSIS — E118 Type 2 diabetes mellitus with unspecified complications: Secondary | ICD-10-CM | POA: Diagnosis not present

## 2017-10-05 ENCOUNTER — Ambulatory Visit: Payer: Self-pay | Admitting: General Surgery

## 2017-10-05 DIAGNOSIS — I10 Essential (primary) hypertension: Secondary | ICD-10-CM | POA: Diagnosis not present

## 2017-10-05 DIAGNOSIS — E785 Hyperlipidemia, unspecified: Secondary | ICD-10-CM | POA: Diagnosis not present

## 2017-10-05 DIAGNOSIS — Z0000 Encounter for general adult medical examination without abnormal findings: Secondary | ICD-10-CM | POA: Diagnosis not present

## 2017-10-05 DIAGNOSIS — M25562 Pain in left knee: Secondary | ICD-10-CM | POA: Diagnosis not present

## 2017-10-05 DIAGNOSIS — G8929 Other chronic pain: Secondary | ICD-10-CM | POA: Diagnosis not present

## 2017-10-05 DIAGNOSIS — Z79899 Other long term (current) drug therapy: Secondary | ICD-10-CM | POA: Diagnosis not present

## 2017-10-05 DIAGNOSIS — M25561 Pain in right knee: Secondary | ICD-10-CM | POA: Diagnosis not present

## 2017-10-05 DIAGNOSIS — E7800 Pure hypercholesterolemia, unspecified: Secondary | ICD-10-CM | POA: Diagnosis not present

## 2017-10-05 DIAGNOSIS — E119 Type 2 diabetes mellitus without complications: Secondary | ICD-10-CM | POA: Diagnosis not present

## 2017-10-08 ENCOUNTER — Encounter (HOSPITAL_BASED_OUTPATIENT_CLINIC_OR_DEPARTMENT_OTHER): Payer: Self-pay | Admitting: *Deleted

## 2017-10-08 ENCOUNTER — Other Ambulatory Visit: Payer: Self-pay

## 2017-10-14 DIAGNOSIS — M1711 Unilateral primary osteoarthritis, right knee: Secondary | ICD-10-CM | POA: Diagnosis not present

## 2017-10-14 DIAGNOSIS — M1712 Unilateral primary osteoarthritis, left knee: Secondary | ICD-10-CM | POA: Diagnosis not present

## 2017-10-18 ENCOUNTER — Ambulatory Visit (HOSPITAL_BASED_OUTPATIENT_CLINIC_OR_DEPARTMENT_OTHER)
Admission: RE | Admit: 2017-10-18 | Discharge: 2017-10-18 | Disposition: A | Discharge status: Home / Self Care | Payer: PPO | Source: Ambulatory Visit | Attending: General Surgery | Admitting: General Surgery

## 2017-10-18 ENCOUNTER — Encounter (HOSPITAL_BASED_OUTPATIENT_CLINIC_OR_DEPARTMENT_OTHER)
Admission: RE | Disposition: A | Discharge status: Home / Self Care | Payer: Self-pay | Source: Ambulatory Visit | Attending: General Surgery

## 2017-10-18 ENCOUNTER — Encounter (HOSPITAL_BASED_OUTPATIENT_CLINIC_OR_DEPARTMENT_OTHER): Payer: Self-pay

## 2017-10-18 DIAGNOSIS — Z7951 Long term (current) use of inhaled steroids: Secondary | ICD-10-CM | POA: Diagnosis not present

## 2017-10-18 DIAGNOSIS — Z452 Encounter for adjustment and management of vascular access device: Secondary | ICD-10-CM | POA: Diagnosis not present

## 2017-10-18 DIAGNOSIS — Z79891 Long term (current) use of opiate analgesic: Secondary | ICD-10-CM | POA: Diagnosis not present

## 2017-10-18 DIAGNOSIS — Z7984 Long term (current) use of oral hypoglycemic drugs: Secondary | ICD-10-CM | POA: Insufficient documentation

## 2017-10-18 DIAGNOSIS — Z853 Personal history of malignant neoplasm of breast: Secondary | ICD-10-CM | POA: Insufficient documentation

## 2017-10-18 DIAGNOSIS — D701 Agranulocytosis secondary to cancer chemotherapy: Secondary | ICD-10-CM

## 2017-10-18 DIAGNOSIS — Z79899 Other long term (current) drug therapy: Secondary | ICD-10-CM | POA: Diagnosis not present

## 2017-10-18 HISTORY — PX: PORT-A-CATH REMOVAL: SHX5289

## 2017-10-18 SURGERY — REMOVAL PORT-A-CATH
Anesthesia: LOCAL | Site: Chest | Laterality: Left

## 2017-10-18 MED ORDER — BUPIVACAINE-EPINEPHRINE 0.25% -1:200000 IJ SOLN
INTRAMUSCULAR | Status: DC | PRN
  Administered 2017-10-18: 11.5 mL

## 2017-10-18 MED ORDER — BUPIVACAINE HCL (PF) 0.25 % IJ SOLN
INTRAMUSCULAR | Status: AC
  Filled 2017-10-18: qty 30

## 2017-10-18 MED ORDER — LIDOCAINE HCL (PF) 1 % IJ SOLN
INTRAMUSCULAR | Status: AC
  Filled 2017-10-18: qty 30

## 2017-10-18 MED ORDER — HYDROCODONE-ACETAMINOPHEN 5-325 MG PO TABS: 1.0000 | ORAL_TABLET | Freq: Four times a day (QID) | ORAL | 0 refills | Status: DC | PRN

## 2017-10-18 MED ORDER — BUPIVACAINE-EPINEPHRINE (PF) 0.25% -1:200000 IJ SOLN
INTRAMUSCULAR | Status: AC
  Filled 2017-10-18: qty 30

## 2017-10-18 MED ORDER — LIDOCAINE HCL (PF) 1 % IJ SOLN
INTRAMUSCULAR | Status: DC | PRN
  Administered 2017-10-18: 11.5 mL

## 2017-10-18 SURGICAL SUPPLY — 28 items
ADH SKN CLS APL DERMABOND .7 (GAUZE/BANDAGES/DRESSINGS) ×1
BLADE SURG 15 STRL LF DISP TIS (BLADE) ×1 IMPLANT
BLADE SURG 15 STRL SS (BLADE) ×2
CHLORAPREP W/TINT 26ML (MISCELLANEOUS) ×2 IMPLANT
COVER BACK TABLE 60X90IN (DRAPES) ×2 IMPLANT
COVER MAYO STAND STRL (DRAPES) ×2 IMPLANT
DECANTER SPIKE VIAL GLASS SM (MISCELLANEOUS) ×2 IMPLANT
DERMABOND ADVANCED (GAUZE/BANDAGES/DRESSINGS) ×1
DERMABOND ADVANCED .7 DNX12 (GAUZE/BANDAGES/DRESSINGS) ×1 IMPLANT
DRAPE LAPAROTOMY 100X72 PEDS (DRAPES) ×2 IMPLANT
DRAPE UTILITY XL STRL (DRAPES) ×2 IMPLANT
ELECT COATED BLADE 2.86 ST (ELECTRODE) IMPLANT
ELECT REM PT RETURN 9FT ADLT (ELECTROSURGICAL)
ELECTRODE REM PT RTRN 9FT ADLT (ELECTROSURGICAL) IMPLANT
GLOVE BIO SURGEON STRL SZ7.5 (GLOVE) ×2 IMPLANT
GOWN STRL REUS W/ TWL LRG LVL3 (GOWN DISPOSABLE) ×2 IMPLANT
GOWN STRL REUS W/TWL LRG LVL3 (GOWN DISPOSABLE) ×4
NDL HYPO 25X1 1.5 SAFETY (NEEDLE) ×1 IMPLANT
NEEDLE HYPO 25X1 1.5 SAFETY (NEEDLE) ×2 IMPLANT
PACK BASIN DAY SURGERY FS (CUSTOM PROCEDURE TRAY) ×2 IMPLANT
PENCIL BUTTON HOLSTER BLD 10FT (ELECTRODE) IMPLANT
SLEEVE SCD COMPRESS KNEE MED (MISCELLANEOUS) IMPLANT
SUT MON AB 4-0 PC3 18 (SUTURE) ×2 IMPLANT
SUT VIC AB 3-0 SH 27 (SUTURE) ×2
SUT VIC AB 3-0 SH 27X BRD (SUTURE) ×1 IMPLANT
SYR CONTROL 10ML LL (SYRINGE) ×2 IMPLANT
TOWEL OR 17X24 6PK STRL BLUE (TOWEL DISPOSABLE) ×2 IMPLANT
TOWEL OR NON WOVEN STRL DISP B (DISPOSABLE) ×2 IMPLANT

## 2017-10-18 NOTE — H&P (Signed)
Gillian Scarce  Location: [facility] Patient #: [id number] DOB: 11-03-1940 Widowed / Language: [name] / Race: Black or African American Female   History of Present Illness  The patient is a 77 year old female who presents for a follow-up for Breast cancer. The patient is a 77 year old black female who is about 2 years status post right breast lumpectomy and sentinel node mapping for a T1 cN0 right breast cancer that was ER positive and PR negative and HER-2 negative with a Ki 67 of 60%. She tolerated the surgery well. She has had no major medical problems in the last year. She denies any breast pain or discharge from the nipple. She will be due for her next mammogram in June.   Allergies  Horse Derived Products  Allergies Reconciled   Medication History Exemestane ('25MG'$  Tablet, Oral) Active. Bumex ('1MG'$  Tablet, Oral) Active. TraMADol HCl ('50MG'$  Tablet, Oral) Active. Atorvastatin Calcium ('80MG'$  Tablet, Oral) Active. Estropipate (1.'5MG'$  Tablet, Oral) Active. Lisinopril-Hydrochlorothiazide (20-12.'5MG'$  Tablet, Oral) Active. MetFORMIN HCl ER ('500MG'$  Tablet ER 24HR, Oral) Active. Pioglitazone HCl ('15MG'$  Tablet, Oral) Active. Restasis (0.05% Emulsion, Ophthalmic) Active. Xalatan (0.005% Solution, Ophthalmic) Active. Actos ('15MG'$  Tablet, Oral) Active. NexIUM ('40MG'$  Capsule DR, Oral) Active. Allegra Allergy ('180MG'$  Tablet, Oral) Active. Combigan (0.2-0.5% Solution, Ophthalmic) Active. Fluticasone Propionate (50MCG/ACT Suspension, Nasal) Active. Glucosamine ('500MG'$  Capsule, Oral) Active. Magnesium Oxide ('250MG'$  Tablet, Oral) Active. Vitamin B Complex (Oral) Active. Medications Reconciled    Review of Systems  General Present- Night Sweats. Not Present- Appetite Loss, Chills, Fatigue, Fever, Weight Gain and Weight Loss. Skin Not Present- Change in Wart/Mole, Dryness, Hives, Jaundice, New Lesions, Non-Healing Wounds, Rash and Ulcer. HEENT Present- Seasonal  Allergies and Wears glasses/contact lenses. Not Present- Earache, Hearing Loss, Hoarseness, Nose Bleed, Oral Ulcers, Ringing in the Ears, Sinus Pain, Sore Throat, Visual Disturbances and Yellow Eyes. Respiratory Present- Snoring. Not Present- Bloody sputum, Chronic Cough, Difficulty Breathing and Wheezing. Breast Not Present- Breast Mass, Breast Pain, Nipple Discharge and Skin Changes. Cardiovascular Present- Swelling of Extremities. Not Present- Chest Pain, Difficulty Breathing Lying Down, Leg Cramps, Palpitations, Rapid Heart Rate and Shortness of Breath. Gastrointestinal Not Present- Abdominal Pain, Bloating, Bloody Stool, Change in Bowel Habits, Chronic diarrhea, Constipation, Difficulty Swallowing, Excessive gas, Gets full quickly at meals, Hemorrhoids, Indigestion, Nausea, Rectal Pain and Vomiting. Female Genitourinary Present- Frequency, Nocturia and Urgency. Not Present- Painful Urination and Pelvic Pain. Musculoskeletal Present- Joint Pain. Not Present- Back Pain, Joint Stiffness, Muscle Pain, Muscle Weakness and Swelling of Extremities. Neurological Present- Tingling and Trouble walking. Not Present- Decreased Memory, Fainting, Headaches, Numbness, Seizures, Tremor and Weakness. Psychiatric Not Present- Anxiety, Bipolar, Change in Sleep Pattern, Depression, Fearful and Frequent crying. Endocrine Present- Hot flashes. Not Present- Cold Intolerance, Excessive Hunger, Hair Changes, Heat Intolerance and New Diabetes. Hematology Present- Excessive bleeding. Not Present- Easy Bruising, Gland problems, HIV and Persistent Infections.  Vitals  Weight: 211.8 lb Height: 61in Body Surface Area: 1.94 m Body Mass Index: 40.02 kg/m  Temp.: 97.67F  Pulse: 98 (Regular)        Physical Exam General Mental Status-Alert. General Appearance-Consistent with stated age. Hydration-Well hydrated. Voice-Normal.  Head and Neck Head-normocephalic, atraumatic with no lesions or  palpable masses. Trachea-midline. Thyroid Gland Characteristics - normal size and consistency.  Eye Eyeball - Bilateral-Extraocular movements intact. Sclera/Conjunctiva - Bilateral-No scleral icterus.  Chest and Lung Exam Chest and lung exam reveals -quiet, even and easy respiratory effort with no use of accessory muscles and on auscultation, normal breath sounds, no adventitious sounds and normal  vocal resonance. Inspection Chest Wall - Normal. Back - normal.  Breast Note: The right breast and axillary incisions have healed nicely with no sign of infection or seroma. There is no palpable mass in either breast. There is no palpable axillary, supraclavicular, or cervical lymphadenopathy. There is some mild thickness of the right breast compared to the left which is likely secondary to radiation.    Cardiovascular Cardiovascular examination reveals -normal heart sounds, regular rate and rhythm with no murmurs and normal pedal pulses bilaterally.  Abdomen Inspection Inspection of the abdomen reveals - No Hernias. Skin - Scar - no surgical scars. Palpation/Percussion Palpation and Percussion of the abdomen reveal - Soft, Non Tender, No Rebound tenderness, No Rigidity (guarding) and No hepatosplenomegaly. Auscultation Auscultation of the abdomen reveals - Bowel sounds normal.  Neurologic Neurologic evaluation reveals -alert and oriented x 3 with no impairment of recent or remote memory. Mental Status-Normal.  Musculoskeletal Normal Exam - Left-Upper Extremity Strength Normal and Lower Extremity Strength Normal. Normal Exam - Right-Upper Extremity Strength Normal and Lower Extremity Strength Normal.  Lymphatic Head & Neck  General Head & Neck Lymphatics: Bilateral - Description - Normal. Axillary  General Axillary Region: Bilateral - Description - Normal. Tenderness - Non Tender. Femoral & Inguinal  Generalized Femoral & Inguinal Lymphatics: Bilateral -  Description - Normal. Tenderness - Non Tender.    Assessment & Plan  BREAST CANCER OF UPPER-INNER QUADRANT OF RIGHT FEMALE BREAST (C50.211) Impression: The patient is almost 2 years status post right breast lumpectomy for breast cancer. She continues to do well with no clinical evidence of recurrence. At this point she will get her mammogram in June. If this is negative I will see her shortly after her mammogram for a recheck. She will continue to do regular self exams. Current Plans Follow up with [place] in the office in 4 MONTHS.  Call us sooner as needed.

## 2017-10-18 NOTE — Op Note (Signed)
10/18/2017  9:04 AM  PATIENT:  Shelley Lawrence  77 y.o. female  PRE-OPERATIVE DIAGNOSIS:  RIGHT BREAST CANCER  POST-OPERATIVE DIAGNOSIS:  RIGHT BREAST CANCER  PROCEDURE:  Procedure(s): REMOVAL PORT-A-CATH (Left)  SURGEON:  Surgeon(s) and Role:    * Jovita Kussmaul, MD - Primary  PHYSICIAN ASSISTANT:   ASSISTANTS: none   ANESTHESIA:   local  EBL:  minimal   BLOOD ADMINISTERED:none  DRAINS: none   LOCAL MEDICATIONS USED:  MARCAINE    and LIDOCAINE   SPECIMEN:  No Specimen  DISPOSITION OF SPECIMEN:  N/A  COUNTS:  YES  TOURNIQUET:  * No tourniquets in log *  DICTATION: .Dragon Dictation   After informed consent was obtained the patient was brought to the operating room and placed in the supine position on the operating table.  The left chest wall was then prepped with ChloraPrep, allowed to dry, and draped in usual sterile manner.  An appropriate timeout was performed.  The area around the port was infiltrated with a combination of 1% lidocaine and quarter percent Marcaine until a good field block was created.  A small incision was made with a 15 blade knife through her old incision.  The incision was carried through the subcutaneous tissue sharply with the 15 blade knife until the capsule surrounding the port was opened.  The 2 anchoring stitches were divided and removed.  With gentle traction the port was then removed from the patient without difficulty.  Pressure was held for several minutes until the area was completely hemostatic.  The deep layer of the wound was then closed with interrupted 3-0 Vicryl stitches.  The skin was then closed with a running 4-0 Monocryl subcuticular stitch.  Dermabond dressings were applied.  The patient tolerated the procedure well.  At the end of the case all needle sponge and instrument counts were correct.  The patient was then awakened and taken to recovery in stable condition.  PLAN OF CARE: Discharge to home after PACU  PATIENT  DISPOSITION:  PACU - hemodynamically stable.   Delay start of Pharmacological VTE agent (>24hrs) due to surgical blood loss or risk of bleeding: not applicable

## 2017-10-18 NOTE — Interval H&P Note (Signed)
History and Physical Interval Note:  10/18/2017 8:22 AM  Shelley Lawrence  has presented today for surgery, with the diagnosis of RIGHT BREAST CANCER  The various methods of treatment have been discussed with the patient and family. After consideration of risks, benefits and other options for treatment, the patient has consented to  Procedure(s) with comments: REMOVAL PORT-A-CATH (N/A) - Patient just wants local as a surgical intervention .  The patient's history has been reviewed, patient examined, no change in status, stable for surgery.  I have reviewed the patient's chart and labs.  Questions were answered to the patient's satisfaction.     [name],Alexiana Laverdure S

## 2017-10-19 ENCOUNTER — Encounter (HOSPITAL_BASED_OUTPATIENT_CLINIC_OR_DEPARTMENT_OTHER): Payer: Self-pay | Admitting: General Surgery

## 2017-11-09 DIAGNOSIS — E119 Type 2 diabetes mellitus without complications: Secondary | ICD-10-CM | POA: Diagnosis not present

## 2017-11-09 DIAGNOSIS — H401132 Primary open-angle glaucoma, bilateral, moderate stage: Secondary | ICD-10-CM | POA: Diagnosis not present

## 2017-12-06 ENCOUNTER — Other Ambulatory Visit: Payer: Self-pay | Admitting: Adult Health

## 2017-12-06 ENCOUNTER — Other Ambulatory Visit: Payer: Self-pay | Admitting: Hematology and Oncology

## 2017-12-06 DIAGNOSIS — Z9889 Other specified postprocedural states: Secondary | ICD-10-CM

## 2017-12-16 ENCOUNTER — Ambulatory Visit
Admission: RE | Admit: 2017-12-16 | Discharge: 2017-12-16 | Disposition: A | Discharge status: Home / Self Care | Payer: PPO | Source: Ambulatory Visit | Attending: Hematology and Oncology | Admitting: Hematology and Oncology

## 2017-12-16 DIAGNOSIS — Z9889 Other specified postprocedural states: Secondary | ICD-10-CM

## 2017-12-16 DIAGNOSIS — R922 Inconclusive mammogram: Secondary | ICD-10-CM | POA: Diagnosis not present

## 2018-01-13 DIAGNOSIS — M1711 Unilateral primary osteoarthritis, right knee: Secondary | ICD-10-CM | POA: Diagnosis not present

## 2018-01-13 DIAGNOSIS — M1712 Unilateral primary osteoarthritis, left knee: Secondary | ICD-10-CM | POA: Diagnosis not present

## 2018-02-14 DIAGNOSIS — Z5181 Encounter for therapeutic drug level monitoring: Secondary | ICD-10-CM | POA: Diagnosis not present

## 2018-02-14 DIAGNOSIS — I10 Essential (primary) hypertension: Secondary | ICD-10-CM | POA: Diagnosis not present

## 2018-02-14 DIAGNOSIS — E119 Type 2 diabetes mellitus without complications: Secondary | ICD-10-CM | POA: Diagnosis not present

## 2018-02-14 DIAGNOSIS — M25561 Pain in right knee: Secondary | ICD-10-CM | POA: Diagnosis not present

## 2018-02-22 DIAGNOSIS — R32 Unspecified urinary incontinence: Secondary | ICD-10-CM | POA: Diagnosis not present

## 2018-02-22 DIAGNOSIS — I10 Essential (primary) hypertension: Secondary | ICD-10-CM | POA: Diagnosis not present

## 2018-02-22 DIAGNOSIS — R011 Cardiac murmur, unspecified: Secondary | ICD-10-CM | POA: Diagnosis not present

## 2018-02-22 DIAGNOSIS — M1990 Unspecified osteoarthritis, unspecified site: Secondary | ICD-10-CM | POA: Diagnosis not present

## 2018-02-22 DIAGNOSIS — E119 Type 2 diabetes mellitus without complications: Secondary | ICD-10-CM | POA: Diagnosis not present

## 2018-02-22 DIAGNOSIS — E7800 Pure hypercholesterolemia, unspecified: Secondary | ICD-10-CM | POA: Diagnosis not present

## 2018-03-01 DIAGNOSIS — H35342 Macular cyst, hole, or pseudohole, left eye: Secondary | ICD-10-CM | POA: Diagnosis not present

## 2018-03-01 DIAGNOSIS — H401132 Primary open-angle glaucoma, bilateral, moderate stage: Secondary | ICD-10-CM | POA: Diagnosis not present

## 2018-03-01 DIAGNOSIS — H35033 Hypertensive retinopathy, bilateral: Secondary | ICD-10-CM | POA: Diagnosis not present

## 2018-03-07 DIAGNOSIS — H43811 Vitreous degeneration, right eye: Secondary | ICD-10-CM | POA: Diagnosis not present

## 2018-03-07 DIAGNOSIS — H43821 Vitreomacular adhesion, right eye: Secondary | ICD-10-CM | POA: Diagnosis not present

## 2018-03-07 DIAGNOSIS — H35342 Macular cyst, hole, or pseudohole, left eye: Secondary | ICD-10-CM | POA: Diagnosis not present

## 2018-03-07 DIAGNOSIS — H401130 Primary open-angle glaucoma, bilateral, stage unspecified: Secondary | ICD-10-CM | POA: Diagnosis not present

## 2018-04-19 DIAGNOSIS — R351 Nocturia: Secondary | ICD-10-CM | POA: Diagnosis not present

## 2018-04-19 DIAGNOSIS — N3941 Urge incontinence: Secondary | ICD-10-CM | POA: Diagnosis not present

## 2018-04-19 DIAGNOSIS — R350 Frequency of micturition: Secondary | ICD-10-CM | POA: Diagnosis not present

## 2018-05-09 DIAGNOSIS — M1711 Unilateral primary osteoarthritis, right knee: Secondary | ICD-10-CM | POA: Diagnosis not present

## 2018-05-09 DIAGNOSIS — M1712 Unilateral primary osteoarthritis, left knee: Secondary | ICD-10-CM | POA: Diagnosis not present

## 2018-06-16 DIAGNOSIS — R350 Frequency of micturition: Secondary | ICD-10-CM | POA: Diagnosis not present

## 2018-06-16 DIAGNOSIS — R351 Nocturia: Secondary | ICD-10-CM | POA: Diagnosis not present

## 2018-06-24 NOTE — Progress Notes (Signed)
Patient Care Team: Jani Gravel, MD as PCP - General (Internal Medicine) Jovita Kussmaul, MD as Consulting Physician (General Surgery) Nicholas Lose, MD as Consulting Physician (Oncology) Gery Pray, MD as Consulting Physician (Radiation Oncology) Gardenia Phlegm, NP as Nurse Practitioner (Hematology and Oncology)  DIAGNOSIS:    ICD-10-CM   1. Malignant neoplasm of upper-inner quadrant of right breast in female, estrogen receptor positive ([place]) C50.211    Z17.0     SUMMARY OF ONCOLOGIC HISTORY:   Breast cancer of upper-inner quadrant of right female breast ([place])   10/14/2015 Initial Diagnosis    Screening detected Right breast mass at 1:00:1.1 x 0.8 x 0.7 cm,grade 2 IDC plus DCIS, ER 100 present, PR 0%, HER-2 negative ratio 1.64, Ki-67 60%, T1cN0 stage IA    10/30/2015 Surgery    Right lumpectomy: IDC grade 2, 1.4 cm, IG DCIS, margins negative, 0/2 lymph nodes negative, ER 100%, PR 0%, HER-2 negative, Ki-67 60 %, T1 cN0 stage IA, Oncotype DX score 30, 20% risk of recurrence    11/25/2015 - 01/27/2016 Chemotherapy    Adjuvant chemotherapy with Taxotere and Cytoxan every 3 weeks 4 cycles    03/16/2016 - 04/29/2016 Radiation Therapy    Adj XRT    06/09/2016 -  Anti-estrogen oral therapy    Anastrozole 1 mg by mouth daily     CHIEF COMPLIANT: Follow-up on exemestane therapy  INTERVAL HISTORY: Shelley Lawrence is a 78 y.o. with above-mentioned history of right breast cancer treated with lumpectomy, adjuvant chemotherapy, and radiation. She could not tolerate anastrozole or letrozole and declined exemestane when she read the side effects. I last saw her one year ago. Her most recent mammogram on 12/16/17 showed no evidence of malignancy bilaterally. She presents to the clinic alone today. She reports joints aches and pains in her legs. There is fat necrosis at the breast surgical scar that she is concerned about. Her bp was 197/84 today and she reports she is not taking her blood  pressure medication regularly because she is trying to get her incontinence under control with other medication. She reviewed her medication list with me.   REVIEW OF SYSTEMS:   Constitutional: Denies fevers, chills or abnormal weight loss Eyes: Denies blurriness of vision Ears, nose, mouth, throat, and face: Denies mucositis or sore throat Respiratory: Denies cough, dyspnea or wheezes Cardiovascular: Denies palpitation, chest discomfort Gastrointestinal:  Denies nausea, heartburn or change in bowel habits (+) incontinence Skin: Denies abnormal skin rashes MSK: (+) aches and pains in legs Lymphatics: Denies new lymphadenopathy or easy bruising Neurological: Denies numbness, tingling or new weaknesses Behavioral/Psych: Mood is stable, no new changes  Extremities: No lower extremity edema Breast: denies any pain or nodules in either breasts (+) fat necrosis, right breast All other systems were reviewed with the patient and are negative.  I have reviewed the past medical history, past surgical history, social history and family history with the patient and they are unchanged from previous note.  ALLERGIES:  is allergic to horse-derived products.  MEDICATIONS:  Current Outpatient Medications  Medication Sig Dispense Refill  . aspirin 81 MG tablet Take 81 mg by mouth daily.    Marland Kitchen atorvastatin (LIPITOR) 40 MG tablet Take 40 mg by mouth daily.    Marland Kitchen b complex vitamins capsule Take 1 capsule by mouth daily.    . brimonidine-timolol (COMBIGAN) 0.2-0.5 % ophthalmic solution Place 1 drop into the left eye every 12 (twelve) hours.    . cycloSPORINE (RESTASIS) 0.05 % ophthalmic  emulsion Place 1 drop into both eyes 2 (two) times daily.    Marland Kitchen esomeprazole (NEXIUM) 40 MG capsule Take 1 capsule (40 mg total) by mouth daily before breakfast. 30 capsule 1  . fexofenadine (ALLEGRA) 180 MG tablet Take 180 mg by mouth daily.    . fluticasone (VERAMYST) 27.5 MCG/SPRAY nasal spray Place 2 sprays into the nose  daily.    Marland Kitchen ibuprofen (ADVIL,MOTRIN) 400 MG tablet Take 400 mg by mouth every 6 (six) hours as needed.    . latanoprost (XALATAN) 0.005 % ophthalmic solution Place 1 drop into both eyes at bedtime.    Marland Kitchen losartan (COZAAR) 25 MG tablet Take 25 mg by mouth daily.    . metFORMIN (GLUCOPHAGE-XR) 500 MG 24 hr tablet Take 500 mg by mouth daily with breakfast.     . Multiple Vitamins-Minerals (MULTIVITAMIN WITH MINERALS) tablet Take 1 tablet by mouth daily.    Glory Rosebush VERIO test strip     . solifenacin (VESICARE) 5 MG tablet Take 1 tablet (5 mg total) by mouth daily.    Marland Kitchen Specialty Vitamins Products (MAGNESIUM, AMINO ACID CHELATE,) 133 MG tablet Take 1 tablet by mouth. '250mg'$     . traMADol (ULTRAM) 50 MG tablet Take 50 mg by mouth every 8 (eight) hours as needed.      No current facility-administered medications for this visit.     PHYSICAL EXAMINATION: ECOG PERFORMANCE STATUS: 1 - Symptomatic but completely ambulatory  Vitals:   06/28/18 1120  BP: (!) 197/84  Pulse: 78  Resp: 17  Temp: 98.6 F (37 C)  SpO2: 100%   Filed Weights   06/28/18 1120  Weight: 214 lb 4.8 oz (97.2 kg)    GENERAL: alert, no distress and comfortable SKIN: skin color, texture, turgor are normal, no rashes or significant lesions EYES: normal, Conjunctiva are pink and non-injected, sclera clear OROPHARYNX: no exudate, no erythema and lips, buccal mucosa, and tongue normal  NECK: supple, thyroid normal size, non-tender, without nodularity LYMPH: no palpable lymphadenopathy in the cervical, axillary or inguinal LUNGS: clear to auscultation and percussion with normal breathing effort HEART: regular rate & rhythm and no murmurs and no lower extremity edema ABDOMEN: abdomen soft, non-tender and normal bowel sounds MUSCULOSKELETAL: no cyanosis of digits and no clubbing  NEURO: alert & oriented x 3 with fluent speech, no focal motor/sensory deficits EXTREMITIES: No lower extremity edema BREAST: No palpable masses or  nodules in either right or left breasts.  Scar tissue from prior surgeries palpated in the right breast with a small knot on the medial aspect of the scar from fat necrosis.  No palpable axillary supraclavicular or infraclavicular adenopathy no breast tenderness or nipple discharge. (exam performed in the presence of a chaperone)  LABORATORY DATA:  I have reviewed the data as listed CMP Latest Ref Rng & Units 03/23/2017 04/27/2016 01/27/2016  Glucose 70 - 140 mg/dl 116 138 128  BUN 7.0 - 26.0 mg/dL 14.1 12.3 18.4  Creatinine 0.6 - 1.1 mg/dL 0.7 0.6 0.7  Sodium 136 - 145 mEq/L 141 140 141  Potassium 3.5 - 5.1 mEq/L 4.1 3.9 3.7  CO2 22 - 29 mEq/L '28 26 29  '$ Calcium 8.4 - 10.4 mg/dL 9.6 9.5 9.2  Total Protein 6.4 - 8.3 g/dL 6.6 6.8 5.8(L)  Total Bilirubin 0.20 - 1.20 mg/dL 0.33 0.25 0.31  Alkaline Phos 40 - 150 U/L 100 92 87  AST 5 - 34 U/L '20 21 19  '$ ALT 0 - 55 U/L 17 20 16  Lab Results  Component Value Date   WBC 6.1 03/23/2017   HGB 10.9 (L) 03/23/2017   HCT 33.1 (L) 03/23/2017   MCV 78.5 (L) 03/23/2017   PLT 247 03/23/2017   NEUTROABS 4.2 03/23/2017    ASSESSMENT & PLAN:  Breast cancer of upper-inner quadrant of right female breast (Shelley Lawrence) Right lumpectomy 10/30/2015: IDC grade 2, 1.4 cm, IG DCIS, margins negative, 0/2 lymph nodes negative, ER 100%, PR 0%, HER-2 negative, Ki-67 60 %, T1 cN0 stage IA Oncotype DX score 30, 20% risk of recurrence  Treatment plan: 1. Adjuvant chemotherapy With Taxotere and Cytoxan 4 started 11/25/2015 completed 01/27/2016 2. Followed by adjuvant radiation therapy completed 03/24/16 3. Followed by adjuvant antiestrogen therapy with Anastrozole 1 mg daily Started January 2018, switched to letrozole, prescribed exemestane 03/23/2017 but she did not take it ------------------------------------------------------------------------------------------------------------------- Surveillance: 1. Mammogram  12/16/2017: Benign, breast density category C 2.  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 Severe hypertension: Patient had not been taking her blood pressure medication.  I instructed her to resume taking the medications and check in with her primary care physician.  Return to clinic in1 yrfor follow-up with survivorship long-term  No orders of the defined types were placed in this encounter.  The patient has a good understanding of the overall plan. she agrees with it. she will call with any problems that may develop before the next visit here.  Nicholas Lose, MD 06/28/2018  Julious Oka Dorshimer am acting as scribe for Dr. Nicholas Lose.  I have reviewed the above documentation for accuracy and completeness, and I agree with the above.

## 2018-06-27 DIAGNOSIS — M170 Bilateral primary osteoarthritis of knee: Secondary | ICD-10-CM | POA: Diagnosis not present

## 2018-06-28 ENCOUNTER — Telehealth: Payer: Self-pay | Admitting: Adult Health

## 2018-06-28 ENCOUNTER — Inpatient Hospital Stay: Payer: PPO | Attending: Hematology and Oncology | Admitting: Hematology and Oncology

## 2018-06-28 DIAGNOSIS — Z7982 Long term (current) use of aspirin: Secondary | ICD-10-CM | POA: Diagnosis not present

## 2018-06-28 DIAGNOSIS — Z79899 Other long term (current) drug therapy: Secondary | ICD-10-CM | POA: Diagnosis not present

## 2018-06-28 DIAGNOSIS — Z170 Estrogen receptor positive status [ER+]: Secondary | ICD-10-CM

## 2018-06-28 DIAGNOSIS — C50211 Malignant neoplasm of upper-inner quadrant of right female breast: Secondary | ICD-10-CM | POA: Diagnosis not present

## 2018-06-28 DIAGNOSIS — Z9221 Personal history of antineoplastic chemotherapy: Secondary | ICD-10-CM

## 2018-06-28 DIAGNOSIS — Z79811 Long term (current) use of aromatase inhibitors: Secondary | ICD-10-CM

## 2018-06-28 DIAGNOSIS — Z923 Personal history of irradiation: Secondary | ICD-10-CM | POA: Diagnosis not present

## 2018-06-28 MED ORDER — SOLIFENACIN SUCCINATE 5 MG PO TABS: 5.0000 mg | ORAL_TABLET | Freq: Every day | ORAL | Status: DC

## 2018-06-28 NOTE — Assessment & Plan Note (Signed)
Right lumpectomy 10/30/2015: IDC grade 2, 1.4 cm, IG DCIS, margins negative, 0/2 lymph nodes negative, ER 100%, PR 0%, HER-2 negative, Ki-67 60 %, T1 cN0 stage IA Oncotype DX score 30, 20% risk of recurrence  Treatment plan: 1. Adjuvant chemotherapy With Taxotere and Cytoxan 4 started 11/25/2015 completed 01/27/2016 2. Followed by adjuvant radiation therapy completed 03/24/16 3. Followed by adjuvant antiestrogen therapy with Anastrozole 1 mg daily Started January 2018, switched to letrozole, prescribed exemestane 03/23/2017 but she did not take it ------------------------------------------------------------------------------------------------------------------- Surveillance: 1. Mammogram 12/16/2017: Benign, breast density category C 2. breast exam 06/27/2018: Benign   Chemo-induced peripheral neuropathy: Resolved  Return to clinic in1 yrfor follow-up

## 2018-06-28 NOTE — Telephone Encounter (Signed)
Gave avs and calendar ° °

## 2018-07-04 DIAGNOSIS — M1711 Unilateral primary osteoarthritis, right knee: Secondary | ICD-10-CM | POA: Diagnosis not present

## 2018-07-04 DIAGNOSIS — M1712 Unilateral primary osteoarthritis, left knee: Secondary | ICD-10-CM | POA: Diagnosis not present

## 2018-07-07 DIAGNOSIS — E7800 Pure hypercholesterolemia, unspecified: Secondary | ICD-10-CM | POA: Diagnosis not present

## 2018-07-07 DIAGNOSIS — I10 Essential (primary) hypertension: Secondary | ICD-10-CM | POA: Diagnosis not present

## 2018-07-07 DIAGNOSIS — E119 Type 2 diabetes mellitus without complications: Secondary | ICD-10-CM | POA: Diagnosis not present

## 2018-07-11 DIAGNOSIS — M1712 Unilateral primary osteoarthritis, left knee: Secondary | ICD-10-CM | POA: Diagnosis not present

## 2018-07-11 DIAGNOSIS — M1711 Unilateral primary osteoarthritis, right knee: Secondary | ICD-10-CM | POA: Diagnosis not present

## 2018-07-11 DIAGNOSIS — M170 Bilateral primary osteoarthritis of knee: Secondary | ICD-10-CM | POA: Diagnosis not present

## 2018-07-13 DIAGNOSIS — E7800 Pure hypercholesterolemia, unspecified: Secondary | ICD-10-CM | POA: Diagnosis not present

## 2018-07-13 DIAGNOSIS — I10 Essential (primary) hypertension: Secondary | ICD-10-CM | POA: Diagnosis not present

## 2018-07-13 DIAGNOSIS — M1990 Unspecified osteoarthritis, unspecified site: Secondary | ICD-10-CM | POA: Diagnosis not present

## 2018-07-13 DIAGNOSIS — E119 Type 2 diabetes mellitus without complications: Secondary | ICD-10-CM | POA: Diagnosis not present

## 2018-07-13 DIAGNOSIS — R011 Cardiac murmur, unspecified: Secondary | ICD-10-CM | POA: Diagnosis not present

## 2018-07-28 DIAGNOSIS — H35033 Hypertensive retinopathy, bilateral: Secondary | ICD-10-CM | POA: Diagnosis not present

## 2018-07-28 DIAGNOSIS — H18312 Folds and rupture in Bowman's membrane, left eye: Secondary | ICD-10-CM | POA: Diagnosis not present

## 2018-07-28 DIAGNOSIS — H401132 Primary open-angle glaucoma, bilateral, moderate stage: Secondary | ICD-10-CM | POA: Diagnosis not present

## 2018-07-28 DIAGNOSIS — H35342 Macular cyst, hole, or pseudohole, left eye: Secondary | ICD-10-CM | POA: Diagnosis not present

## 2018-08-25 DIAGNOSIS — M1711 Unilateral primary osteoarthritis, right knee: Secondary | ICD-10-CM | POA: Diagnosis not present

## 2018-08-25 DIAGNOSIS — M170 Bilateral primary osteoarthritis of knee: Secondary | ICD-10-CM | POA: Diagnosis not present

## 2018-08-25 DIAGNOSIS — M1712 Unilateral primary osteoarthritis, left knee: Secondary | ICD-10-CM | POA: Diagnosis not present

## 2018-10-20 DIAGNOSIS — Z20828 Contact with and (suspected) exposure to other viral communicable diseases: Secondary | ICD-10-CM | POA: Diagnosis not present

## 2018-11-18 ENCOUNTER — Other Ambulatory Visit: Payer: Self-pay | Admitting: Hematology and Oncology

## 2018-11-18 DIAGNOSIS — Z853 Personal history of malignant neoplasm of breast: Secondary | ICD-10-CM

## 2018-12-07 DIAGNOSIS — M170 Bilateral primary osteoarthritis of knee: Secondary | ICD-10-CM | POA: Diagnosis not present

## 2018-12-07 DIAGNOSIS — E669 Obesity, unspecified: Secondary | ICD-10-CM | POA: Diagnosis not present

## 2018-12-19 ENCOUNTER — Ambulatory Visit
Admission: RE | Admit: 2018-12-19 | Discharge: 2018-12-19 | Disposition: A | Discharge status: Home / Self Care | Payer: PPO | Source: Ambulatory Visit | Attending: Hematology and Oncology | Admitting: Hematology and Oncology

## 2018-12-19 ENCOUNTER — Other Ambulatory Visit: Payer: Self-pay

## 2018-12-19 DIAGNOSIS — Z853 Personal history of malignant neoplasm of breast: Secondary | ICD-10-CM

## 2018-12-19 DIAGNOSIS — R922 Inconclusive mammogram: Secondary | ICD-10-CM | POA: Diagnosis not present

## 2019-01-04 DIAGNOSIS — E7800 Pure hypercholesterolemia, unspecified: Secondary | ICD-10-CM | POA: Diagnosis not present

## 2019-01-04 DIAGNOSIS — E119 Type 2 diabetes mellitus without complications: Secondary | ICD-10-CM | POA: Diagnosis not present

## 2019-01-04 DIAGNOSIS — I10 Essential (primary) hypertension: Secondary | ICD-10-CM | POA: Diagnosis not present

## 2019-01-11 DIAGNOSIS — S91302A Unspecified open wound, left foot, initial encounter: Secondary | ICD-10-CM | POA: Diagnosis not present

## 2019-01-11 DIAGNOSIS — Z0000 Encounter for general adult medical examination without abnormal findings: Secondary | ICD-10-CM | POA: Diagnosis not present

## 2019-01-11 DIAGNOSIS — E785 Hyperlipidemia, unspecified: Secondary | ICD-10-CM | POA: Diagnosis not present

## 2019-01-11 DIAGNOSIS — E119 Type 2 diabetes mellitus without complications: Secondary | ICD-10-CM | POA: Diagnosis not present

## 2019-01-11 DIAGNOSIS — Z7189 Other specified counseling: Secondary | ICD-10-CM | POA: Diagnosis not present

## 2019-01-11 DIAGNOSIS — I10 Essential (primary) hypertension: Secondary | ICD-10-CM | POA: Diagnosis not present

## 2019-01-23 DIAGNOSIS — C50211 Malignant neoplasm of upper-inner quadrant of right female breast: Secondary | ICD-10-CM | POA: Diagnosis not present

## 2019-03-10 DIAGNOSIS — M1712 Unilateral primary osteoarthritis, left knee: Secondary | ICD-10-CM | POA: Diagnosis not present

## 2019-03-10 DIAGNOSIS — M170 Bilateral primary osteoarthritis of knee: Secondary | ICD-10-CM | POA: Diagnosis not present

## 2019-03-10 DIAGNOSIS — M1711 Unilateral primary osteoarthritis, right knee: Secondary | ICD-10-CM | POA: Diagnosis not present

## 2019-05-02 ENCOUNTER — Other Ambulatory Visit: Payer: Self-pay

## 2019-05-31 ENCOUNTER — Telehealth: Payer: Self-pay | Admitting: Adult Health

## 2019-05-31 NOTE — Telephone Encounter (Signed)
Patient aware of reschedule

## 2019-07-03 ENCOUNTER — Encounter: Payer: PPO | Admitting: Adult Health

## 2019-07-12 DIAGNOSIS — M170 Bilateral primary osteoarthritis of knee: Secondary | ICD-10-CM | POA: Diagnosis not present

## 2019-07-12 DIAGNOSIS — M1712 Unilateral primary osteoarthritis, left knee: Secondary | ICD-10-CM | POA: Diagnosis not present

## 2019-07-12 DIAGNOSIS — M1711 Unilateral primary osteoarthritis, right knee: Secondary | ICD-10-CM | POA: Diagnosis not present

## 2019-07-20 ENCOUNTER — Ambulatory Visit: Payer: PPO | Attending: Family

## 2019-07-20 DIAGNOSIS — Z23 Encounter for immunization: Secondary | ICD-10-CM | POA: Insufficient documentation

## 2019-07-20 NOTE — Progress Notes (Signed)
   Covid-19 Vaccination Clinic  Name:  Shelley Lawrence    MRN: [id number] DOB: [birth year]  07/20/2019  Shelley Lawrence was observed post Covid-19 immunization for 15 minutes without incidence. She was provided with Vaccine Information Sheet and instruction to access the V-Safe system.   Shelley Lawrence was instructed to call 911 with any severe reactions post vaccine: Marland Kitchen Difficulty breathing  . Swelling of your face and throat  . A fast heartbeat  . A bad rash all over your body  . Dizziness and weakness    Immunizations Administered    Name Date Dose VIS Date Route   Moderna COVID-19 Vaccine 07/20/2019  2:25 PM 0.5 mL 05/09/2019 Intramuscular   Manufacturer: Moderna   Lot: [id number]   [id number]

## 2019-08-02 ENCOUNTER — Other Ambulatory Visit: Payer: Self-pay

## 2019-08-02 ENCOUNTER — Inpatient Hospital Stay: Payer: PPO | Attending: Adult Health | Admitting: Adult Health

## 2019-08-02 ENCOUNTER — Encounter: Payer: Self-pay | Admitting: Adult Health

## 2019-08-02 VITALS — BP 191/88 | HR 87 | Temp 98.2°F | Resp 18 | Ht 61.0 in | Wt 207.9 lb

## 2019-08-02 DIAGNOSIS — Z90710 Acquired absence of both cervix and uterus: Secondary | ICD-10-CM | POA: Insufficient documentation

## 2019-08-02 DIAGNOSIS — Z923 Personal history of irradiation: Secondary | ICD-10-CM | POA: Insufficient documentation

## 2019-08-02 DIAGNOSIS — Z79899 Other long term (current) drug therapy: Secondary | ICD-10-CM | POA: Insufficient documentation

## 2019-08-02 DIAGNOSIS — Z170 Estrogen receptor positive status [ER+]: Secondary | ICD-10-CM

## 2019-08-02 DIAGNOSIS — Z7982 Long term (current) use of aspirin: Secondary | ICD-10-CM | POA: Insufficient documentation

## 2019-08-02 DIAGNOSIS — Z9221 Personal history of antineoplastic chemotherapy: Secondary | ICD-10-CM | POA: Diagnosis not present

## 2019-08-02 DIAGNOSIS — C50211 Malignant neoplasm of upper-inner quadrant of right female breast: Secondary | ICD-10-CM | POA: Diagnosis not present

## 2019-08-02 DIAGNOSIS — Z853 Personal history of malignant neoplasm of breast: Secondary | ICD-10-CM | POA: Insufficient documentation

## 2019-08-02 DIAGNOSIS — I10 Essential (primary) hypertension: Secondary | ICD-10-CM | POA: Diagnosis not present

## 2019-08-02 DIAGNOSIS — E119 Type 2 diabetes mellitus without complications: Secondary | ICD-10-CM | POA: Diagnosis not present

## 2019-08-02 DIAGNOSIS — Z7984 Long term (current) use of oral hypoglycemic drugs: Secondary | ICD-10-CM | POA: Diagnosis not present

## 2019-08-02 DIAGNOSIS — Z791 Long term (current) use of non-steroidal anti-inflammatories (NSAID): Secondary | ICD-10-CM | POA: Insufficient documentation

## 2019-08-02 NOTE — Progress Notes (Signed)
CLINIC:  Survivorship   REASON FOR VISIT:  Routine follow-up for history of breast cancer.   BRIEF ONCOLOGIC HISTORY:  Oncology History  Breast cancer of upper-inner quadrant of right female breast ([place])  10/14/2015 Initial Diagnosis   Screening detected Right breast mass at 1:00:1.1 x 0.8 x 0.7 cm,grade 2 IDC plus DCIS, ER 100 present, PR 0%, HER-2 negative ratio 1.64, Ki-67 60%, T1cN0 stage IA   10/30/2015 Surgery   Right lumpectomy: IDC grade 2, 1.4 cm, IG DCIS, margins negative, 0/2 lymph nodes negative, ER 100%, PR 0%, HER-2 negative, Ki-67 60 %, T1 cN0 stage IA, Oncotype DX score 30, 20% risk of recurrence   11/25/2015 - 01/27/2016 Chemotherapy   Adjuvant chemotherapy with Taxotere and Cytoxan every 3 weeks 4 cycles   03/16/2016 - 04/29/2016 Radiation Therapy   Adj XRT   06/09/2016 - 09/2016 Anti-estrogen oral therapy   Anastrozole 1 mg by mouth daily--unable to tolerate this or any other antiestrogen therapy      INTERVAL HISTORY:  Shelley Lawrence presents to the [facility] today for routine follow-up for her history of breast cancer.  Overall, she reports feeling quite well.   Amir last underwent bilateral breast mammogram on 12/19/2018 that showed no evidence of malignancy and breast density category C.    [name] notes that she has had a good year.  She has had no new issues as far as her breasts are concerned.    REVIEW OF SYSTEMS:  Review of Systems  Constitutional: Negative for appetite change, chills, fatigue and unexpected weight change.  HENT:   Negative for hearing loss and lump/mass.   Eyes: Negative for eye problems and icterus.  Respiratory: Negative for chest tightness, cough and shortness of breath.   Cardiovascular: Negative for chest pain, leg swelling and palpitations.  Gastrointestinal: Negative for abdominal distention, abdominal pain, blood in stool, constipation, diarrhea, nausea and vomiting.  Endocrine: Negative for hot flashes.    Musculoskeletal: Negative for arthralgias.  Skin: Negative for itching and rash.  Neurological: Negative for dizziness, extremity weakness, headaches and numbness.  Hematological: Negative for adenopathy. Does not bruise/bleed easily.  Psychiatric/Behavioral: Negative for depression. The patient is not nervous/anxious.    Breast: Denies any new nodularity, masses, tenderness, nipple changes, or nipple discharge.       PAST MEDICAL/SURGICAL HISTORY:  Past Medical History:  Diagnosis Date  . Allergy   . Anemia   . Arthritis    knees  . Breast cancer ([place]) 10/14/2015   Right Breast  . Broken foot   . Carpal tunnel syndrome   . Diabetes mellitus without complication ([place])   . GERD (gastroesophageal reflux disease)   . History of radiation therapy 03/16/16-04/29/16   Right breast/ 50.4 Gy in 28 fractions and right breast boost / 12 Gy in 6 fractions.  . Hot flashes   . Hypertension    Past Surgical History:  Procedure Laterality Date  . ABDOMINAL HYSTERECTOMY    . BACK SURGERY    . BREAST BIOPSY Right 10/14/2015   U/S Core- Malignant  . BREAST LUMPECTOMY Right 10/30/2015  . BREAST LUMPECTOMY WITH NEEDLE LOCALIZATION AND AXILLARY SENTINEL LYMPH NODE BX Right 10/30/2015   Procedure: BREAST LUMPECTOMY WITH NEEDLE LOCALIZATION AND AXILLARY SENTINEL LYMPH NODE BX;  Surgeon: Autumn Messing III, MD;  Location: [facility];  Service: General;  Laterality: Right;  . CARPAL TUNNEL RELEASE Right   . KNEE SURGERY    . OVARY SURGERY     removal of  ovarian tumor  . PORT-A-CATH REMOVAL Left 10/18/2017   Procedure: REMOVAL PORT-A-CATH;  Surgeon: Jovita Kussmaul, MD;  Location: [facility];  Service: General;  Laterality: Left;  . PORTACATH PLACEMENT Left 11/21/2015   Procedure: INSERTION PORT-A-CATH;  Surgeon: Autumn Messing III, MD;  Location: [facility];  Service: General;  Laterality: Left;  . TONSILECTOMY/ADENOIDECTOMY WITH MYRINGOTOMY    . TUBAL LIGATION        ALLERGIES:  Allergies  Allergen Reactions  . Horse-Derived Products     REACTION: swelling     CURRENT MEDICATIONS:  Outpatient Encounter Medications as of 08/02/2019  Medication Sig Note  . phenazopyridine (PYRIDIUM) 95 MG tablet Take 95 mg by mouth in the morning and at bedtime.   . TURMERIC PO Take 1,200 mg by mouth daily.   Marland Kitchen aspirin 81 MG tablet Take 81 mg by mouth daily.   Marland Kitchen atorvastatin (LIPITOR) 40 MG tablet Take 40 mg by mouth daily.   Marland Kitchen b complex vitamins capsule Take 1 capsule by mouth daily.   . brimonidine-timolol (COMBIGAN) 0.2-0.5 % ophthalmic solution Place 1 drop into the left eye every 12 (twelve) hours.   . cycloSPORINE (RESTASIS) 0.05 % ophthalmic emulsion Place 1 drop into both eyes 2 (two) times daily.   Marland Kitchen esomeprazole (NEXIUM) 40 MG capsule Take 1 capsule (40 mg total) by mouth daily before breakfast.   . fexofenadine (ALLEGRA) 180 MG tablet Take 180 mg by mouth daily.   . fluticasone (VERAMYST) 27.5 MCG/SPRAY nasal spray Place 2 sprays into the nose daily.   Marland Kitchen ibuprofen (ADVIL,MOTRIN) 400 MG tablet Take 400 mg by mouth every 6 (six) hours as needed.   . latanoprost (XALATAN) 0.005 % ophthalmic solution Place 1 drop into both eyes at bedtime.   Marland Kitchen losartan (COZAAR) 25 MG tablet Take 25 mg by mouth daily.   . metFORMIN (GLUCOPHAGE-XR) 500 MG 24 hr tablet Take 500 mg by mouth daily with breakfast.    . Multiple Vitamins-Minerals (MULTIVITAMIN WITH MINERALS) tablet Take 1 tablet by mouth daily.   Glory Rosebush VERIO test strip  10/23/2015: Received from: External Pharmacy  . Specialty Vitamins Products (MAGNESIUM, AMINO ACID CHELATE,) 133 MG tablet Take 1 tablet by mouth. '250mg'$    . traMADol (ULTRAM) 50 MG tablet Take 50 mg by mouth every 8 (eight) hours as needed.    . [DISCONTINUED] solifenacin (VESICARE) 5 MG tablet Take 1 tablet (5 mg total) by mouth daily.    No facility-administered encounter medications on file as of 08/02/2019.     ONCOLOGIC FAMILY  HISTORY:  Non contributory  GENETIC COUNSELING/TESTING: Not at this time  SOCIAL HISTORY:  Social History   Socioeconomic History  . Marital status: Widowed    Spouse name: Not on file  . Number of children: Not on file  . Years of education: Not on file  . Highest education level: Not on file  Occupational History  . Occupation: Sales promotion account executive  Tobacco Use  . Smoking status: Never Smoker  . Smokeless tobacco: Never Used  Substance and Sexual Activity  . Alcohol use: No  . Drug use: No  . Sexual activity: Not on file  Other Topics Concern  . Not on file  Social History Narrative  . Not on file   Social Determinants of Health   Financial Resource Strain:   . Difficulty of Paying Living Expenses: Not on file  Food Insecurity:   . Worried About Charity fundraiser in the Last Year: Not  on file  . Ran Out of Food in the Last Year: Not on file  Transportation Needs:   . Lack of Transportation (Medical): Not on file  . Lack of Transportation (Non-Medical): Not on file  Physical Activity:   . Days of Exercise per Week: Not on file  . Minutes of Exercise per Session: Not on file  Stress:   . Feeling of Stress : Not on file  Social Connections:   . Frequency of Communication with Friends and Family: Not on file  . Frequency of Social Gatherings with Friends and Family: Not on file  . Attends Religious Services: Not on file  . Active Member of Clubs or Organizations: Not on file  . Attends Archivist Meetings: Not on file  . Marital Status: Not on file  Intimate Partner Violence:   . Fear of Current or Ex-Partner: Not on file  . Emotionally Abused: Not on file  . Physically Abused: Not on file  . Sexually Abused: Not on file     PHYSICAL EXAMINATION:  Vital Signs: Vitals:   08/02/19 1052  BP: (!) 191/88  Pulse: 87  Resp: 18  Temp: 98.2 F (36.8 C)  SpO2: 100%   Filed Weights   08/02/19 1052  Weight: 207 lb 14.4 oz (94.3 kg)   General:  Well-nourished, well-appearing female in no acute distress.  Unaccompanied today.   HEENT: Head is normocephalic.  Pupils equal and reactive to light. Conjunctivae clear without exudate.  Sclerae anicteric. Oral mucosa is pink, moist.  Oropharynx is pink without lesions or erythema.  Lymph: No cervical, supraclavicular, or infraclavicular lymphadenopathy noted on palpation.  Cardiovascular: Regular rate and rhythm.Marland Kitchen Respiratory: Clear to auscultation bilaterally. Chest expansion symmetric; breathing non-labored.  Breast Exam:  -Left breast: No appreciable masses on palpation. No skin redness, thickening, or peau d'orange appearance; no nipple retraction or nipple discharge;  -Right breast: No appreciable masses on palpation. No skin redness, thickening, or peau d'orange appearance; no nipple retraction or nipple discharge; mild distortion in symmetry at previous lumpectomy site well healed scar without erythema or nodularity. -Axilla: No axillary adenopathy bilaterally.  GI: Abdomen soft and round; non-tender, non-distended. Bowel sounds normoactive. No hepatosplenomegaly.   GU: Deferred.  Neuro: No focal deficits. Steady gait.  Psych: Mood and affect normal and appropriate for situation.  MSK: No focal spinal tenderness to palpation, full range of motion in bilateral upper extremities Extremities: No edema. Skin: Warm and dry.  LABORATORY DATA:  None for this visit   DIAGNOSTIC IMAGING:  Most recent mammogram:  CLINICAL DATA:  Status post right lumpectomy, radiation therapy and chemotherapy for breast cancer in 2017.  EXAM: DIGITAL DIAGNOSTIC BILATERAL MAMMOGRAM WITH CAD AND TOMO  COMPARISON:  Previous exam(s).  ACR Breast Density Category c: The breast tissue is heterogeneously dense, which may obscure small masses.  FINDINGS: Stable post lumpectomy changes on the right. No interval findings suspicious for malignancy in either breast.  Mammographic images were processed  with CAD.  IMPRESSION: No evidence of malignancy.  RECOMMENDATION: Bilateral diagnostic mammogram in 1 year.  I have discussed the findings and recommendations with the patient. Results were also provided in writing at the conclusion of the visit. If applicable, a reminder letter will be sent to the patient regarding the next appointment.  BI-RADS CATEGORY  2: Benign.   Electronically Signed   By: Claudie Revering M.D.   On: 12/19/2018 11:38   ASSESSMENT AND PLAN:  Ms.. Lawrence is a pleasant 79 y.o. female  with history of Stage IA right breast invasive ductal carcinoma, ER+/PR-/HER2-, diagnosed in 10/2015, treated with lumpectomy, adjuvant chemotherapy adjuvant radiation therapy, and anti-estrogen therapy with Anastrozole daily starting 06/2016, unable to tolerate.  [facility] for [facility] and routine follow-up.   1. History of breast cancer:  Shelley Lawrence is currently clinically and radiographically without evidence of disease or recurrence of breast cancer. She will be due for mammogram in 12/2019; orders placed today.  She will return to the cancer center in one year for continued LTS follow up.  I encouraged her to call me with any questions or concerns before her next visit at the cancer center, and I would be happy to see her sooner, if needed.    2. Bone health:  Given Shelley Lawrence's age, history of breast cancer, and her previous anti-estrogen therapy with Anastrozole, she is at risk for bone demineralization. Her last DEXA scan was in 2013 and is normal.   She was given education on specific food and activities to promote bone health.  3. Cancer screening:  Due to Shelley Lawrence's history and her age, she should receive screening for skin cancers, colon cancer. She was encouraged to follow-up with her PCP for appropriate cancer screenings.   4. Health maintenance and wellness promotion: Shelley Lawrence was encouraged to consume 5-7 servings of fruits and  vegetables per day. She was also encouraged to engage in moderate to vigorous exercise for 30 minutes per day most days of the week. She was instructed to limit her alcohol consumption and continue to abstain from tobacco use.    Dispo:  -Return to cancer center in one year for LTS follow up -Mammogram in 12/2019  Total encounter time: 30 minutes  Shelley Bihari, NP 08/02/19 11:18 AM Medical Oncology and Hematology [facility] [place], [place] [address] Tel. [phone]    Fax. [phone]  *Total Encounter Time as defined by the Centers for Medicare and Medicaid Services includes, in addition to the face-to-face time of a patient visit (documented in the note above) non-face-to-face time: obtaining and reviewing outside history, ordering and reviewing medications, tests or procedures, care coordination (communications with other health care professionals or caregivers) and documentation in the medical record.     Note: PRIMARY CARE PROVIDER Jani Gravel, [place] [phone]

## 2019-08-21 DIAGNOSIS — Z0000 Encounter for general adult medical examination without abnormal findings: Secondary | ICD-10-CM | POA: Diagnosis not present

## 2019-08-21 DIAGNOSIS — E119 Type 2 diabetes mellitus without complications: Secondary | ICD-10-CM | POA: Diagnosis not present

## 2019-08-22 ENCOUNTER — Ambulatory Visit: Payer: PPO | Attending: Family

## 2019-08-22 DIAGNOSIS — Z23 Encounter for immunization: Secondary | ICD-10-CM

## 2019-08-22 NOTE — Progress Notes (Signed)
   Covid-19 Vaccination Clinic  Name:  Shelley Lawrence    MRN: [id number] DOB: 07-28-1940  08/22/2019  Shelley Lawrence was observed post Covid-19 immunization for 15 minutes without incident. She was provided with Vaccine Information Sheet and instruction to access the V-Safe system.   Shelley Lawrence was instructed to call 911 with any severe reactions post vaccine: Marland Kitchen Difficulty breathing  . Swelling of face and throat  . A fast heartbeat  . A bad rash all over body  . Dizziness and weakness   Immunizations Administered    Name Date Dose VIS Date Route   Moderna COVID-19 Vaccine 08/22/2019  1:57 PM 0.5 mL 05/09/2019 Intramuscular   Manufacturer: Moderna   Lot: [id number]   [id number]

## 2019-08-23 ENCOUNTER — Ambulatory Visit: Payer: PPO

## 2019-08-30 DIAGNOSIS — I10 Essential (primary) hypertension: Secondary | ICD-10-CM | POA: Diagnosis not present

## 2019-08-30 DIAGNOSIS — K219 Gastro-esophageal reflux disease without esophagitis: Secondary | ICD-10-CM | POA: Diagnosis not present

## 2019-08-30 DIAGNOSIS — R32 Unspecified urinary incontinence: Secondary | ICD-10-CM | POA: Diagnosis not present

## 2019-08-30 DIAGNOSIS — M1990 Unspecified osteoarthritis, unspecified site: Secondary | ICD-10-CM | POA: Diagnosis not present

## 2019-08-30 DIAGNOSIS — E785 Hyperlipidemia, unspecified: Secondary | ICD-10-CM | POA: Diagnosis not present

## 2019-08-30 DIAGNOSIS — G629 Polyneuropathy, unspecified: Secondary | ICD-10-CM | POA: Diagnosis not present

## 2019-08-30 DIAGNOSIS — E669 Obesity, unspecified: Secondary | ICD-10-CM | POA: Diagnosis not present

## 2019-08-30 DIAGNOSIS — E119 Type 2 diabetes mellitus without complications: Secondary | ICD-10-CM | POA: Diagnosis not present

## 2019-10-20 DIAGNOSIS — M1712 Unilateral primary osteoarthritis, left knee: Secondary | ICD-10-CM | POA: Diagnosis not present

## 2019-10-20 DIAGNOSIS — Z6835 Body mass index (BMI) 35.0-35.9, adult: Secondary | ICD-10-CM | POA: Diagnosis not present

## 2019-10-20 DIAGNOSIS — M1711 Unilateral primary osteoarthritis, right knee: Secondary | ICD-10-CM | POA: Diagnosis not present

## 2019-11-14 DIAGNOSIS — N3941 Urge incontinence: Secondary | ICD-10-CM | POA: Diagnosis not present

## 2019-11-14 DIAGNOSIS — R351 Nocturia: Secondary | ICD-10-CM | POA: Diagnosis not present

## 2019-11-14 DIAGNOSIS — R350 Frequency of micturition: Secondary | ICD-10-CM | POA: Diagnosis not present

## 2019-12-25 ENCOUNTER — Ambulatory Visit
Admission: RE | Admit: 2019-12-25 | Discharge: 2019-12-25 | Disposition: A | Discharge status: Home / Self Care | Payer: PPO | Source: Ambulatory Visit | Attending: Adult Health | Admitting: Adult Health

## 2019-12-25 ENCOUNTER — Other Ambulatory Visit: Payer: Self-pay

## 2019-12-25 DIAGNOSIS — R928 Other abnormal and inconclusive findings on diagnostic imaging of breast: Secondary | ICD-10-CM | POA: Diagnosis not present

## 2019-12-25 DIAGNOSIS — Z170 Estrogen receptor positive status [ER+]: Secondary | ICD-10-CM

## 2019-12-25 DIAGNOSIS — Z853 Personal history of malignant neoplasm of breast: Secondary | ICD-10-CM | POA: Diagnosis not present

## 2020-01-03 DIAGNOSIS — C50211 Malignant neoplasm of upper-inner quadrant of right female breast: Secondary | ICD-10-CM | POA: Diagnosis not present

## 2020-01-04 DIAGNOSIS — R350 Frequency of micturition: Secondary | ICD-10-CM | POA: Diagnosis not present

## 2020-01-04 DIAGNOSIS — N3941 Urge incontinence: Secondary | ICD-10-CM | POA: Diagnosis not present

## 2020-01-11 DIAGNOSIS — N3941 Urge incontinence: Secondary | ICD-10-CM | POA: Diagnosis not present

## 2020-01-11 DIAGNOSIS — R350 Frequency of micturition: Secondary | ICD-10-CM | POA: Diagnosis not present

## 2020-01-16 DIAGNOSIS — H35342 Macular cyst, hole, or pseudohole, left eye: Secondary | ICD-10-CM | POA: Diagnosis not present

## 2020-01-16 DIAGNOSIS — H35033 Hypertensive retinopathy, bilateral: Secondary | ICD-10-CM | POA: Diagnosis not present

## 2020-01-16 DIAGNOSIS — E119 Type 2 diabetes mellitus without complications: Secondary | ICD-10-CM | POA: Diagnosis not present

## 2020-01-16 DIAGNOSIS — H401132 Primary open-angle glaucoma, bilateral, moderate stage: Secondary | ICD-10-CM | POA: Diagnosis not present

## 2020-01-18 DIAGNOSIS — N3941 Urge incontinence: Secondary | ICD-10-CM | POA: Diagnosis not present

## 2020-01-25 DIAGNOSIS — N3941 Urge incontinence: Secondary | ICD-10-CM | POA: Diagnosis not present

## 2020-02-01 DIAGNOSIS — R350 Frequency of micturition: Secondary | ICD-10-CM | POA: Diagnosis not present

## 2020-02-01 DIAGNOSIS — N3941 Urge incontinence: Secondary | ICD-10-CM | POA: Diagnosis not present

## 2020-02-08 DIAGNOSIS — R350 Frequency of micturition: Secondary | ICD-10-CM | POA: Diagnosis not present

## 2020-02-08 DIAGNOSIS — N3941 Urge incontinence: Secondary | ICD-10-CM | POA: Diagnosis not present

## 2020-02-15 DIAGNOSIS — R350 Frequency of micturition: Secondary | ICD-10-CM | POA: Diagnosis not present

## 2020-02-22 DIAGNOSIS — N3941 Urge incontinence: Secondary | ICD-10-CM | POA: Diagnosis not present

## 2020-02-22 DIAGNOSIS — R350 Frequency of micturition: Secondary | ICD-10-CM | POA: Diagnosis not present

## 2020-02-29 DIAGNOSIS — N3941 Urge incontinence: Secondary | ICD-10-CM | POA: Diagnosis not present

## 2020-02-29 DIAGNOSIS — R350 Frequency of micturition: Secondary | ICD-10-CM | POA: Diagnosis not present

## 2020-03-07 DIAGNOSIS — R350 Frequency of micturition: Secondary | ICD-10-CM | POA: Diagnosis not present

## 2020-03-07 DIAGNOSIS — N3941 Urge incontinence: Secondary | ICD-10-CM | POA: Diagnosis not present

## 2020-03-11 DIAGNOSIS — E785 Hyperlipidemia, unspecified: Secondary | ICD-10-CM | POA: Diagnosis not present

## 2020-03-11 DIAGNOSIS — E119 Type 2 diabetes mellitus without complications: Secondary | ICD-10-CM | POA: Diagnosis not present

## 2020-03-11 DIAGNOSIS — M1990 Unspecified osteoarthritis, unspecified site: Secondary | ICD-10-CM | POA: Diagnosis not present

## 2020-03-14 DIAGNOSIS — N3941 Urge incontinence: Secondary | ICD-10-CM | POA: Diagnosis not present

## 2020-03-14 DIAGNOSIS — R350 Frequency of micturition: Secondary | ICD-10-CM | POA: Diagnosis not present

## 2020-03-18 DIAGNOSIS — R011 Cardiac murmur, unspecified: Secondary | ICD-10-CM | POA: Diagnosis not present

## 2020-03-18 DIAGNOSIS — Z853 Personal history of malignant neoplasm of breast: Secondary | ICD-10-CM | POA: Diagnosis not present

## 2020-03-18 DIAGNOSIS — Z0000 Encounter for general adult medical examination without abnormal findings: Secondary | ICD-10-CM | POA: Diagnosis not present

## 2020-03-18 DIAGNOSIS — M1990 Unspecified osteoarthritis, unspecified site: Secondary | ICD-10-CM | POA: Diagnosis not present

## 2020-03-18 DIAGNOSIS — N3281 Overactive bladder: Secondary | ICD-10-CM | POA: Diagnosis not present

## 2020-03-18 DIAGNOSIS — H409 Unspecified glaucoma: Secondary | ICD-10-CM | POA: Diagnosis not present

## 2020-03-18 DIAGNOSIS — Z170 Estrogen receptor positive status [ER+]: Secondary | ICD-10-CM | POA: Diagnosis not present

## 2020-03-18 DIAGNOSIS — K219 Gastro-esophageal reflux disease without esophagitis: Secondary | ICD-10-CM | POA: Diagnosis not present

## 2020-03-18 DIAGNOSIS — E785 Hyperlipidemia, unspecified: Secondary | ICD-10-CM | POA: Diagnosis not present

## 2020-03-18 DIAGNOSIS — I10 Essential (primary) hypertension: Secondary | ICD-10-CM | POA: Diagnosis not present

## 2020-03-18 DIAGNOSIS — E119 Type 2 diabetes mellitus without complications: Secondary | ICD-10-CM | POA: Diagnosis not present

## 2020-03-21 DIAGNOSIS — N3941 Urge incontinence: Secondary | ICD-10-CM | POA: Diagnosis not present

## 2020-03-21 DIAGNOSIS — R350 Frequency of micturition: Secondary | ICD-10-CM | POA: Diagnosis not present

## 2020-04-04 DIAGNOSIS — M170 Bilateral primary osteoarthritis of knee: Secondary | ICD-10-CM | POA: Diagnosis not present

## 2020-04-04 DIAGNOSIS — Z6835 Body mass index (BMI) 35.0-35.9, adult: Secondary | ICD-10-CM | POA: Diagnosis not present

## 2020-04-11 ENCOUNTER — Ambulatory Visit: Payer: PPO | Attending: Family

## 2020-04-11 DIAGNOSIS — Z23 Encounter for immunization: Secondary | ICD-10-CM

## 2020-04-18 DIAGNOSIS — N3941 Urge incontinence: Secondary | ICD-10-CM | POA: Diagnosis not present

## 2020-04-18 DIAGNOSIS — R350 Frequency of micturition: Secondary | ICD-10-CM | POA: Diagnosis not present

## 2020-05-16 DIAGNOSIS — N3941 Urge incontinence: Secondary | ICD-10-CM | POA: Diagnosis not present

## 2020-06-13 DIAGNOSIS — R350 Frequency of micturition: Secondary | ICD-10-CM | POA: Diagnosis not present

## 2020-06-25 NOTE — Progress Notes (Signed)
   Covid-19 Vaccination Clinic  Name:  Shelley Lawrence    MRN: [id number] DOB: [birth year]  06/25/2020  Shelley Lawrence was observed post Covid-19 immunization for 15 minutes without incident. She was provided with Vaccine Information Sheet and instruction to access the V-Safe system.   Shelley Lawrence was instructed to call 911 with any severe reactions post vaccine: Marland Kitchen Difficulty breathing  . Swelling of face and throat  . A fast heartbeat  . A bad rash all over body  . Dizziness and weakness   Immunizations Administered    Name Date Dose VIS Date Route   Moderna Covid-19 Booster Vaccine 04/11/2020  6:30 PM 0.25 mL 03/27/2020 Intramuscular   Manufacturer: Moderna   Lot: [id number]   [place]: [id number]

## 2020-07-11 DIAGNOSIS — R350 Frequency of micturition: Secondary | ICD-10-CM | POA: Diagnosis not present

## 2020-07-11 DIAGNOSIS — N3941 Urge incontinence: Secondary | ICD-10-CM | POA: Diagnosis not present

## 2020-07-17 DIAGNOSIS — E119 Type 2 diabetes mellitus without complications: Secondary | ICD-10-CM | POA: Diagnosis not present

## 2020-07-17 DIAGNOSIS — H401132 Primary open-angle glaucoma, bilateral, moderate stage: Secondary | ICD-10-CM | POA: Diagnosis not present

## 2020-07-17 DIAGNOSIS — H524 Presbyopia: Secondary | ICD-10-CM | POA: Diagnosis not present

## 2020-07-17 DIAGNOSIS — H35342 Macular cyst, hole, or pseudohole, left eye: Secondary | ICD-10-CM | POA: Diagnosis not present

## 2020-07-17 DIAGNOSIS — H35033 Hypertensive retinopathy, bilateral: Secondary | ICD-10-CM | POA: Diagnosis not present

## 2020-08-05 NOTE — Progress Notes (Signed)
Patient Care Team: Jani Gravel, MD as PCP - General (Internal Medicine) Jovita Kussmaul, MD as Consulting Physician (General Surgery) Nicholas Lose, MD as Consulting Physician (Oncology) Gery Pray, MD as Consulting Physician (Radiation Oncology) Gardenia Phlegm, NP as Nurse Practitioner (Hematology and Oncology)  DIAGNOSIS:    ICD-10-CM   1. Malignant neoplasm of upper-inner quadrant of right breast in female, estrogen receptor positive ([place])  C50.211    Z17.0     SUMMARY OF ONCOLOGIC HISTORY: Oncology History  Breast cancer of upper-inner quadrant of right female breast ([place])  10/14/2015 Initial Diagnosis   Screening detected Right breast mass at 1:00:1.1 x 0.8 x 0.7 cm,grade 2 IDC plus DCIS, ER 100 present, PR 0%, HER-2 negative ratio 1.64, Ki-67 60%, T1cN0 stage IA   10/30/2015 Surgery   Right lumpectomy: IDC grade 2, 1.4 cm, IG DCIS, margins negative, 0/2 lymph nodes negative, ER 100%, PR 0%, HER-2 negative, Ki-67 60 %, T1 cN0 stage IA, Oncotype DX score 30, 20% risk of recurrence   11/25/2015 - 01/27/2016 Chemotherapy   Adjuvant chemotherapy with Taxotere and Cytoxan every 3 weeks 4 cycles   03/16/2016 - 04/29/2016 Radiation Therapy   Adj XRT   06/09/2016 - 09/2016 Anti-estrogen oral therapy   Anastrozole 1 mg by mouth daily--unable to tolerate this or any other antiestrogen therapy     CHIEF COMPLIANT: Follow-up of right breast cancer  INTERVAL HISTORY: Shelley Lawrence is a 80 y.o. with above-mentioned history of right breast cancer treated with lumpectomy, adjuvant chemotherapy, and radiation. She could not tolerate anastrozole or letrozole and declined exemestane when she read the side effects. Mammogram on 12/25/19 showed no evidence of malignancy bilaterally. She presents to the clinic today for follow-up.  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  ALLERGIES:  is allergic to horse-derived  products.  MEDICATIONS:  Current Outpatient Medications  Medication Sig Dispense Refill  . aspirin 81 MG tablet Take 81 mg by mouth daily.    Marland Kitchen atorvastatin (LIPITOR) 40 MG tablet Take 40 mg by mouth daily.    Marland Kitchen b complex vitamins capsule Take 1 capsule by mouth daily.    . brimonidine-timolol (COMBIGAN) 0.2-0.5 % ophthalmic solution Place 1 drop into the left eye every 12 (twelve) hours.    . cycloSPORINE (RESTASIS) 0.05 % ophthalmic emulsion Place 1 drop into both eyes 2 (two) times daily.    Marland Kitchen esomeprazole (NEXIUM) 40 MG capsule Take 1 capsule (40 mg total) by mouth daily before breakfast. 30 capsule 1  . fexofenadine (ALLEGRA) 180 MG tablet Take 180 mg by mouth daily.    . fluticasone (VERAMYST) 27.5 MCG/SPRAY nasal spray Place 2 sprays into the nose daily.    Marland Kitchen ibuprofen (ADVIL,MOTRIN) 400 MG tablet Take 400 mg by mouth every 6 (six) hours as needed.    . latanoprost (XALATAN) 0.005 % ophthalmic solution Place 1 drop into both eyes at bedtime.    Marland Kitchen losartan (COZAAR) 25 MG tablet Take 25 mg by mouth daily.    . metFORMIN (GLUCOPHAGE-XR) 500 MG 24 hr tablet Take 500 mg by mouth daily with breakfast.     . Multiple Vitamins-Minerals (MULTIVITAMIN WITH MINERALS) tablet Take 1 tablet by mouth daily.    Glory Rosebush VERIO test strip     . phenazopyridine (PYRIDIUM) 95 MG tablet Take 95 mg by mouth in the morning and at bedtime.    Marland Kitchen Specialty Vitamins Products (MAGNESIUM, AMINO ACID CHELATE,) 133 MG tablet Take  1 tablet by mouth. $RemoveB'250mg'[id number]$     . traMADol (ULTRAM) 50 MG tablet Take 50 mg by mouth every 8 (eight) hours as needed.     . TURMERIC PO Take 1,200 mg by mouth daily.     No current facility-administered medications for this visit.    PHYSICAL EXAMINATION: ECOG PERFORMANCE STATUS: 1 - Symptomatic but completely ambulatory  Vitals:   08/06/20 1031  BP: (!) 162/55  Pulse: 83  Resp: 18  Temp: 98.1 F (36.7 C)  SpO2: 98%   Filed Weights   08/06/20 1031  Weight: 211 lb 8 oz (95.9  kg)    BREAST: No palpable masses or nodules in either right or left breasts. No palpable axillary supraclavicular or infraclavicular adenopathy no breast tenderness or nipple discharge. (exam performed in the presence of a chaperone)  LABORATORY DATA:  I have reviewed the data as listed CMP Latest Ref Rng & Units 03/23/2017 04/27/2016 01/27/2016  Glucose 70 - 140 mg/dl 116 138 128  BUN 7.0 - 26.0 mg/dL 14.1 12.3 18.4  Creatinine 0.6 - 1.1 mg/dL 0.7 0.6 0.7  Sodium 136 - 145 mEq/L 141 140 141  Potassium 3.5 - 5.1 mEq/L 4.1 3.9 3.7  CO2 22 - 29 mEq/L $Remove'28 26 29  'tQPodwf$ Calcium 8.4 - 10.4 mg/dL 9.6 9.5 9.2  Total Protein 6.4 - 8.3 g/dL 6.6 6.8 5.8(L)  Total Bilirubin 0.20 - 1.20 mg/dL 0.33 0.25 0.31  Alkaline Phos 40 - 150 U/L 100 92 87  AST 5 - 34 U/L $Remo'20 21 19  'deaHg$ ALT 0 - 55 U/L $Remo'17 20 16    'cQROt$ Lab Results  Component Value Date   WBC 6.1 03/23/2017   HGB 10.9 (L) 03/23/2017   HCT 33.1 (L) 03/23/2017   MCV 78.5 (L) 03/23/2017   PLT 247 03/23/2017   NEUTROABS 4.2 03/23/2017    ASSESSMENT & PLAN:  Breast cancer of upper-inner quadrant of right female breast ([place]) Right lumpectomy 10/30/2015: IDC grade 2, 1.4 cm, IG DCIS, margins negative, 0/2 lymph nodes negative, ER 100%, PR 0%, HER-2 negative, Ki-67 60 %, T1 cN0 stage IA Oncotype DX score 30, 20% risk of recurrence  Treatment plan: 1. Adjuvant chemotherapy With Taxotere and Cytoxan 4 started 11/25/2015 completed 01/27/2016 2. Followed by adjuvant radiation therapy completed 03/24/16 3. 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  Arthritis in her knees and muscle aches and pains: Continues to be a problem We discussed that since she completed 5 years of follow-up she could be followed by her primary care physician.  However she wants to follow with as for the time being.  Return to clinic in1 yrfor follow-up     No orders of the defined types were placed in this encounter.  The patient has a good understanding of the overall plan. she agrees with it. she will call with any problems that may develop before the next visit here.  Total time spent: 20 mins including face to face time and time spent for planning, charting and coordination of care  Rulon Eisenmenger, MD, MPH 08/06/2020  I, Cloyde Reams Dorshimer, am acting as scribe for Dr. Nicholas Lose.  I have reviewed the above documentation for accuracy and completeness, and I agree with the above.

## 2020-08-05 NOTE — Assessment & Plan Note (Signed)
Right lumpectomy 10/30/2015: IDC grade 2, 1.4 cm, IG DCIS, margins negative, 0/2 lymph nodes negative, ER 100%, PR 0%, HER-2 negative, Ki-67 60 %, T1 cN0 stage IA Oncotype DX score 30, 20% risk of recurrence  Treatment plan: 1. Adjuvant chemotherapy With Taxotere and Cytoxan 4 started 11/25/2015 completed 01/27/2016 2. Followed by adjuvant radiation therapy completed 03/24/16 3. 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  I instructed her to resume taking the medications and check in with her primary care physician.  Return to clinic in1 yrfor follow-up

## 2020-08-06 ENCOUNTER — Inpatient Hospital Stay: Payer: PPO | Attending: Hematology and Oncology | Admitting: Hematology and Oncology

## 2020-08-06 ENCOUNTER — Other Ambulatory Visit: Payer: Self-pay

## 2020-08-06 DIAGNOSIS — Z791 Long term (current) use of non-steroidal anti-inflammatories (NSAID): Secondary | ICD-10-CM | POA: Insufficient documentation

## 2020-08-06 DIAGNOSIS — Z7982 Long term (current) use of aspirin: Secondary | ICD-10-CM | POA: Diagnosis not present

## 2020-08-06 DIAGNOSIS — C50211 Malignant neoplasm of upper-inner quadrant of right female breast: Secondary | ICD-10-CM | POA: Diagnosis not present

## 2020-08-06 DIAGNOSIS — Z923 Personal history of irradiation: Secondary | ICD-10-CM | POA: Insufficient documentation

## 2020-08-06 DIAGNOSIS — G3184 Mild cognitive impairment, so stated: Secondary | ICD-10-CM | POA: Diagnosis not present

## 2020-08-06 DIAGNOSIS — Z9221 Personal history of antineoplastic chemotherapy: Secondary | ICD-10-CM | POA: Insufficient documentation

## 2020-08-06 DIAGNOSIS — Z79899 Other long term (current) drug therapy: Secondary | ICD-10-CM | POA: Diagnosis not present

## 2020-08-06 DIAGNOSIS — Z170 Estrogen receptor positive status [ER+]: Secondary | ICD-10-CM | POA: Insufficient documentation

## 2020-08-06 DIAGNOSIS — Z9223 Personal history of estrogen therapy: Secondary | ICD-10-CM | POA: Insufficient documentation

## 2020-08-06 DIAGNOSIS — M1990 Unspecified osteoarthritis, unspecified site: Secondary | ICD-10-CM | POA: Insufficient documentation

## 2020-08-06 DIAGNOSIS — T451X5A Adverse effect of antineoplastic and immunosuppressive drugs, initial encounter: Secondary | ICD-10-CM | POA: Diagnosis not present

## 2020-08-06 MED ORDER — TOLTERODINE TARTRATE ER 4 MG PO CP24: 4.0000 mg | ORAL_CAPSULE | Freq: Every day | ORAL | Status: DC

## 2020-08-21 DIAGNOSIS — N3941 Urge incontinence: Secondary | ICD-10-CM | POA: Diagnosis not present

## 2020-08-23 ENCOUNTER — Encounter: Payer: Self-pay | Admitting: Internal Medicine

## 2020-08-30 DIAGNOSIS — M25561 Pain in right knee: Secondary | ICD-10-CM | POA: Diagnosis not present

## 2020-08-30 DIAGNOSIS — M25562 Pain in left knee: Secondary | ICD-10-CM | POA: Diagnosis not present

## 2020-08-30 DIAGNOSIS — M1711 Unilateral primary osteoarthritis, right knee: Secondary | ICD-10-CM | POA: Diagnosis not present

## 2020-08-30 DIAGNOSIS — M170 Bilateral primary osteoarthritis of knee: Secondary | ICD-10-CM | POA: Diagnosis not present

## 2020-08-30 DIAGNOSIS — M1712 Unilateral primary osteoarthritis, left knee: Secondary | ICD-10-CM | POA: Diagnosis not present

## 2020-09-24 DIAGNOSIS — E785 Hyperlipidemia, unspecified: Secondary | ICD-10-CM | POA: Diagnosis not present

## 2020-09-24 DIAGNOSIS — E119 Type 2 diabetes mellitus without complications: Secondary | ICD-10-CM | POA: Diagnosis not present

## 2020-09-24 DIAGNOSIS — I10 Essential (primary) hypertension: Secondary | ICD-10-CM | POA: Diagnosis not present

## 2020-09-25 DIAGNOSIS — N3941 Urge incontinence: Secondary | ICD-10-CM | POA: Diagnosis not present

## 2020-09-25 DIAGNOSIS — R351 Nocturia: Secondary | ICD-10-CM | POA: Diagnosis not present

## 2020-10-01 DIAGNOSIS — I10 Essential (primary) hypertension: Secondary | ICD-10-CM | POA: Diagnosis not present

## 2020-10-01 DIAGNOSIS — R7989 Other specified abnormal findings of blood chemistry: Secondary | ICD-10-CM | POA: Diagnosis not present

## 2020-10-01 DIAGNOSIS — E785 Hyperlipidemia, unspecified: Secondary | ICD-10-CM | POA: Diagnosis not present

## 2020-10-01 DIAGNOSIS — G629 Polyneuropathy, unspecified: Secondary | ICD-10-CM | POA: Diagnosis not present

## 2020-10-01 DIAGNOSIS — Z79899 Other long term (current) drug therapy: Secondary | ICD-10-CM | POA: Diagnosis not present

## 2020-10-01 DIAGNOSIS — E119 Type 2 diabetes mellitus without complications: Secondary | ICD-10-CM | POA: Diagnosis not present

## 2020-10-01 DIAGNOSIS — N3281 Overactive bladder: Secondary | ICD-10-CM | POA: Diagnosis not present

## 2020-10-01 DIAGNOSIS — M1990 Unspecified osteoarthritis, unspecified site: Secondary | ICD-10-CM | POA: Diagnosis not present

## 2020-10-01 DIAGNOSIS — K219 Gastro-esophageal reflux disease without esophagitis: Secondary | ICD-10-CM | POA: Diagnosis not present

## 2020-10-16 DIAGNOSIS — H401132 Primary open-angle glaucoma, bilateral, moderate stage: Secondary | ICD-10-CM | POA: Diagnosis not present

## 2020-10-16 DIAGNOSIS — H35342 Macular cyst, hole, or pseudohole, left eye: Secondary | ICD-10-CM | POA: Diagnosis not present

## 2020-10-16 DIAGNOSIS — H35033 Hypertensive retinopathy, bilateral: Secondary | ICD-10-CM | POA: Diagnosis not present

## 2020-11-20 ENCOUNTER — Other Ambulatory Visit: Payer: Self-pay | Admitting: Family Medicine

## 2020-11-20 ENCOUNTER — Other Ambulatory Visit: Payer: Self-pay | Admitting: Hematology and Oncology

## 2020-11-20 DIAGNOSIS — Z1231 Encounter for screening mammogram for malignant neoplasm of breast: Secondary | ICD-10-CM

## 2020-11-22 DIAGNOSIS — M170 Bilateral primary osteoarthritis of knee: Secondary | ICD-10-CM | POA: Diagnosis not present

## 2021-01-14 ENCOUNTER — Other Ambulatory Visit: Payer: Self-pay

## 2021-01-14 ENCOUNTER — Ambulatory Visit
Admission: RE | Admit: 2021-01-14 | Discharge: 2021-01-14 | Disposition: A | Discharge status: Home / Self Care | Payer: PPO | Source: Ambulatory Visit | Attending: Hematology and Oncology | Admitting: Hematology and Oncology

## 2021-01-14 DIAGNOSIS — Z1231 Encounter for screening mammogram for malignant neoplasm of breast: Secondary | ICD-10-CM | POA: Diagnosis not present

## 2021-02-03 DIAGNOSIS — C50411 Malignant neoplasm of upper-outer quadrant of right female breast: Secondary | ICD-10-CM | POA: Diagnosis not present

## 2021-02-03 DIAGNOSIS — Z170 Estrogen receptor positive status [ER+]: Secondary | ICD-10-CM | POA: Diagnosis not present

## 2021-02-05 ENCOUNTER — Other Ambulatory Visit: Payer: Self-pay | Admitting: General Surgery

## 2021-02-05 DIAGNOSIS — Z170 Estrogen receptor positive status [ER+]: Secondary | ICD-10-CM

## 2021-02-06 DIAGNOSIS — M1712 Unilateral primary osteoarthritis, left knee: Secondary | ICD-10-CM | POA: Diagnosis not present

## 2021-02-06 DIAGNOSIS — M1711 Unilateral primary osteoarthritis, right knee: Secondary | ICD-10-CM | POA: Diagnosis not present

## 2021-02-06 DIAGNOSIS — M25561 Pain in right knee: Secondary | ICD-10-CM | POA: Diagnosis not present

## 2021-02-06 DIAGNOSIS — M25562 Pain in left knee: Secondary | ICD-10-CM | POA: Diagnosis not present

## 2021-02-06 DIAGNOSIS — M170 Bilateral primary osteoarthritis of knee: Secondary | ICD-10-CM | POA: Diagnosis not present

## 2021-02-13 ENCOUNTER — Other Ambulatory Visit: Payer: Self-pay | Admitting: General Surgery

## 2021-02-13 DIAGNOSIS — Z170 Estrogen receptor positive status [ER+]: Secondary | ICD-10-CM

## 2021-02-19 DIAGNOSIS — H35033 Hypertensive retinopathy, bilateral: Secondary | ICD-10-CM | POA: Diagnosis not present

## 2021-02-19 DIAGNOSIS — H35342 Macular cyst, hole, or pseudohole, left eye: Secondary | ICD-10-CM | POA: Diagnosis not present

## 2021-02-19 DIAGNOSIS — H401132 Primary open-angle glaucoma, bilateral, moderate stage: Secondary | ICD-10-CM | POA: Diagnosis not present

## 2021-03-12 DIAGNOSIS — E119 Type 2 diabetes mellitus without complications: Secondary | ICD-10-CM | POA: Diagnosis not present

## 2021-03-12 DIAGNOSIS — R7989 Other specified abnormal findings of blood chemistry: Secondary | ICD-10-CM | POA: Diagnosis not present

## 2021-03-12 DIAGNOSIS — I10 Essential (primary) hypertension: Secondary | ICD-10-CM | POA: Diagnosis not present

## 2021-03-12 DIAGNOSIS — E785 Hyperlipidemia, unspecified: Secondary | ICD-10-CM | POA: Diagnosis not present

## 2021-03-19 DIAGNOSIS — K219 Gastro-esophageal reflux disease without esophagitis: Secondary | ICD-10-CM | POA: Diagnosis not present

## 2021-03-19 DIAGNOSIS — H409 Unspecified glaucoma: Secondary | ICD-10-CM | POA: Diagnosis not present

## 2021-03-19 DIAGNOSIS — Z9109 Other allergy status, other than to drugs and biological substances: Secondary | ICD-10-CM | POA: Diagnosis not present

## 2021-03-19 DIAGNOSIS — I10 Essential (primary) hypertension: Secondary | ICD-10-CM | POA: Diagnosis not present

## 2021-03-19 DIAGNOSIS — E785 Hyperlipidemia, unspecified: Secondary | ICD-10-CM | POA: Diagnosis not present

## 2021-03-19 DIAGNOSIS — E119 Type 2 diabetes mellitus without complications: Secondary | ICD-10-CM | POA: Diagnosis not present

## 2021-03-19 DIAGNOSIS — M6530 Trigger finger, unspecified finger: Secondary | ICD-10-CM | POA: Diagnosis not present

## 2021-03-19 DIAGNOSIS — Z0000 Encounter for general adult medical examination without abnormal findings: Secondary | ICD-10-CM | POA: Diagnosis not present

## 2021-03-20 ENCOUNTER — Ambulatory Visit
Admission: RE | Admit: 2021-03-20 | Discharge: 2021-03-20 | Disposition: A | Discharge status: Home / Self Care | Payer: PPO | Source: Ambulatory Visit | Attending: General Surgery | Admitting: General Surgery

## 2021-03-20 ENCOUNTER — Other Ambulatory Visit: Payer: Self-pay

## 2021-03-20 DIAGNOSIS — Z170 Estrogen receptor positive status [ER+]: Secondary | ICD-10-CM

## 2021-03-20 DIAGNOSIS — C50411 Malignant neoplasm of upper-outer quadrant of right female breast: Secondary | ICD-10-CM

## 2021-03-20 DIAGNOSIS — R922 Inconclusive mammogram: Secondary | ICD-10-CM | POA: Diagnosis not present

## 2021-03-20 HISTORY — DX: Personal history of irradiation: Z92.3

## 2021-03-26 DIAGNOSIS — R350 Frequency of micturition: Secondary | ICD-10-CM | POA: Diagnosis not present

## 2021-03-26 DIAGNOSIS — N3941 Urge incontinence: Secondary | ICD-10-CM | POA: Diagnosis not present

## 2021-03-27 DIAGNOSIS — H35342 Macular cyst, hole, or pseudohole, left eye: Secondary | ICD-10-CM | POA: Diagnosis not present

## 2021-03-27 DIAGNOSIS — H35033 Hypertensive retinopathy, bilateral: Secondary | ICD-10-CM | POA: Diagnosis not present

## 2021-03-27 DIAGNOSIS — H401132 Primary open-angle glaucoma, bilateral, moderate stage: Secondary | ICD-10-CM | POA: Diagnosis not present

## 2021-04-02 DIAGNOSIS — M65341 Trigger finger, right ring finger: Secondary | ICD-10-CM | POA: Diagnosis not present

## 2021-05-09 DIAGNOSIS — M170 Bilateral primary osteoarthritis of knee: Secondary | ICD-10-CM | POA: Diagnosis not present

## 2021-05-09 DIAGNOSIS — M25562 Pain in left knee: Secondary | ICD-10-CM | POA: Diagnosis not present

## 2021-05-09 DIAGNOSIS — M25561 Pain in right knee: Secondary | ICD-10-CM | POA: Diagnosis not present

## 2021-08-06 DIAGNOSIS — M1712 Unilateral primary osteoarthritis, left knee: Secondary | ICD-10-CM | POA: Diagnosis not present

## 2021-08-06 DIAGNOSIS — M170 Bilateral primary osteoarthritis of knee: Secondary | ICD-10-CM | POA: Diagnosis not present

## 2021-08-11 NOTE — Progress Notes (Signed)
? ?Patient Care Team: ?Jani Gravel, MD as PCP - General (Internal Medicine) ?Jovita Kussmaul, MD as Consulting Physician (General Surgery) ?Nicholas Lose, MD as Consulting Physician (Oncology) ?Gery Pray, MD as Consulting Physician (Radiation Oncology) ?Gardenia Phlegm, NP as Nurse Practitioner (Hematology and Oncology) ? ?DIAGNOSIS:  ?  ICD-10-CM   ?1. Malignant neoplasm of upper-inner quadrant of right breast in female, estrogen receptor positive ([place])  C50.211   ? Z17.0   ?  ? ? ?SUMMARY OF ONCOLOGIC HISTORY: ?Oncology History  ?Breast cancer of upper-inner quadrant of right female breast ([place])  ?10/14/2015 Initial Diagnosis  ? Screening detected Right breast mass at 1:00:1.1 x 0.8 x 0.7 cm,grade 2 IDC plus DCIS, ER 100 present, PR 0%, HER-2 negative ratio 1.64, Ki-67 60%, T1cN0 stage IA ?  ?10/30/2015 Surgery  ? Right lumpectomy: IDC grade 2, 1.4 cm, IG DCIS, margins negative, 0/2 lymph nodes negative, ER 100%, PR 0%, HER-2 negative, Ki-67 60 %, T1 cN0 stage IA, Oncotype DX score 30, 20% risk of recurrence ?  ?11/25/2015 - 01/27/2016 Chemotherapy  ? Adjuvant chemotherapy with Taxotere and Cytoxan every 3 weeks ?4 cycles ?  ?03/16/2016 - 04/29/2016 Radiation Therapy  ? Adj XRT ?  ?06/09/2016 - 09/2016 Anti-estrogen oral therapy  ? Anastrozole 1 mg by mouth daily--unable to tolerate this or any other antiestrogen therapy ?  ? ? ?CHIEF COMPLIANT: Follow-up of right breast cancer ? ?INTERVAL HISTORY: Shelley Lawrence is a 81 y.o. with above-mentioned history of right breast cancer treated with lumpectomy, adjuvant chemotherapy, and radiation. She could not tolerate anastrozole or letrozole and declined exemestane when she read the side effects. Mammogram on 03/20/2021 showed no evidence of malignancy. She presents to the clinic today for follow-up.  ? ?ALLERGIES:  is allergic to horse-derived products. ? ?MEDICATIONS:  ?Current Outpatient Medications  ?Medication Sig Dispense Refill  ? aspirin 81 MG tablet  Take 81 mg by mouth daily.    ? atorvastatin (LIPITOR) 40 MG tablet Take 40 mg by mouth daily.    ? b complex vitamins capsule Take 1 capsule by mouth daily.    ? brimonidine-timolol (COMBIGAN) 0.2-0.5 % ophthalmic solution Place 1 drop into the left eye every 12 (twelve) hours.    ? cycloSPORINE (RESTASIS) 0.05 % ophthalmic emulsion Place 1 drop into both eyes 2 (two) times daily.    ? esomeprazole (NEXIUM) 40 MG capsule Take 1 capsule (40 mg total) by mouth daily before breakfast. 30 capsule 1  ? fexofenadine (ALLEGRA) 180 MG tablet Take 180 mg by mouth daily.    ? fluticasone (VERAMYST) 27.5 MCG/SPRAY nasal spray Place 2 sprays into the nose daily.    ? ibuprofen (ADVIL,MOTRIN) 400 MG tablet Take 400 mg by mouth every 6 (six) hours as needed.    ? latanoprost (XALATAN) 0.005 % ophthalmic solution Place 1 drop into both eyes at bedtime.    ? losartan (COZAAR) 25 MG tablet Take 25 mg by mouth daily.    ? metFORMIN (GLUCOPHAGE-XR) 500 MG 24 hr tablet Take 500 mg by mouth daily with breakfast.     ? Multiple Vitamins-Minerals (MULTIVITAMIN WITH MINERALS) tablet Take 1 tablet by mouth daily.    ? ONETOUCH VERIO test strip     ? phenazopyridine (PYRIDIUM) 95 MG tablet Take 95 mg by mouth in the morning and at bedtime.    ? Specialty Vitamins Products (MAGNESIUM, AMINO ACID CHELATE,) 133 MG tablet Take 1 tablet by mouth. 217m    ? tolterodine (DETROL LA) 4  MG 24 hr capsule Take 1 capsule (4 mg total) by mouth daily.    ? traMADol (ULTRAM) 50 MG tablet Take 50 mg by mouth every 8 (eight) hours as needed.     ? TURMERIC PO Take 1,200 mg by mouth daily.    ? ?No current facility-administered medications for this visit.  ? ? ?PHYSICAL EXAMINATION: ?ECOG PERFORMANCE STATUS: 1 - Symptomatic but completely ambulatory ? ?Vitals:  ? 08/12/21 1035  ?BP: (!) 161/91  ?Pulse: 90  ?Resp: 18  ?Temp: 97.9 ?F (36.6 ?C)  ?SpO2: 96%  ? ?Filed Weights  ? 08/12/21 1035  ?Weight: 197 lb 14.4 oz (89.8 kg)  ? ? ?BREAST: No palpable masses or  nodules in either right or left breasts. No palpable axillary supraclavicular or infraclavicular adenopathy no breast tenderness or nipple discharge. (exam performed in the presence of a chaperone) ? ?LABORATORY DATA:  ?I have reviewed the data as listed ?CMP Latest Ref Rng & Units 03/23/2017 04/27/2016 01/27/2016  ?Glucose 70 - 140 mg/dl 116 138 128  ?BUN 7.0 - 26.0 mg/dL 14.1 12.3 18.4  ?Creatinine 0.6 - 1.1 mg/dL 0.7 0.6 0.7  ?Sodium 136 - 145 mEq/L 141 140 141  ?Potassium 3.5 - 5.1 mEq/L 4.1 3.9 3.7  ?CO2 22 - 29 mEq/L _0 ?Calcium 8.4 - 10.4 mg/dL 9.6 9.5 9.2  ?Total Protein 6.4 - 8.3 g/dL 6.6 6.8 5.8(L)  ?Total Bilirubin 0.20 - 1.20 mg/dL 0.33 0.25 0.31  ?Alkaline Phos 40 - 150 U/L 100 92 87  ?AST 5 - 34 U/L _1 ?ALT 0 - 55 U/L _2 ? ? ?Lab Results  ?Component Value Date  ? WBC 6.1 03/23/2017  ? HGB 10.9 (L) 03/23/2017  ? HCT 33.1 (L) 03/23/2017  ? MCV 78.5 (L) 03/23/2017  ? PLT 247 03/23/2017  ? NEUTROABS 4.2 03/23/2017  ? ? ?ASSESSMENT & PLAN:  ?Breast cancer of upper-inner quadrant of right female breast ([place]) ?Right lumpectomy 10/30/2015: IDC grade 2, 1.4 cm, IG DCIS, margins negative, 0/2 lymph nodes negative, ER 100%, PR 0%, HER-2 negative, Ki-67 60 %, T1 cN0 stage IA ?Oncotype DX score 30, 20% risk of recurrence ?  ?Treatment plan: ?1. Adjuvant chemotherapy With Taxotere and Cytoxan ?4 started 11/25/2015 completed 01/27/2016 ?2. Followed by adjuvant radiation therapy completed 03/24/16 ?3. Followed by adjuvant antiestrogen therapy with Anastrozole 1 mg daily Started January 2018, switched to letrozole, prescribed exemestane 03/23/2017 but she did not take it ?------------------------------------------------------------------------------------------------------------------- ?Surveillance: ?1. Mammogram and ultrasound 03/20/2021: Benign postoperative changes including fat necrosis in the right breast at surgical site.  No sonographic evidence of malignancy.  Skin discoloration could be  evaluated with a punch biopsy breast density category B ?2. breast exam 08/12/2021: Benign  ?  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 ?  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  ?  ?Return to clinic in 1 yr for follow-up in the long-term survivorship clinic with Mendel Ryder. ? ? ?No orders of the defined types were placed in this encounter. ? ?The patient has a good understanding of the overall plan. she agrees with it. she will call with any problems that may develop before the next visit here. ? ?Total time spent: 20 mins including face to face time and time spent for planning, charting and coordination of care ? ?Rulon Eisenmenger, MD, MPH ?08/12/2021 ? ?I, Thana Ates, am acting as scribe  for Dr. Nicholas Lose. ? ?I have reviewed the above documentation for accuracy and completeness, and I agree with the above. ? ? ? ? ? ? ?

## 2021-08-12 ENCOUNTER — Inpatient Hospital Stay: Payer: PPO | Attending: Hematology and Oncology | Admitting: Hematology and Oncology

## 2021-08-12 ENCOUNTER — Other Ambulatory Visit: Payer: Self-pay

## 2021-08-12 DIAGNOSIS — C50211 Malignant neoplasm of upper-inner quadrant of right female breast: Secondary | ICD-10-CM | POA: Insufficient documentation

## 2021-08-12 DIAGNOSIS — Z170 Estrogen receptor positive status [ER+]: Secondary | ICD-10-CM | POA: Insufficient documentation

## 2021-08-12 MED ORDER — GEMTESA 75 MG PO TABS: 1.0000 | ORAL_TABLET | Freq: Every day | ORAL | Status: AC

## 2021-08-12 MED ORDER — IBUPROFEN 400 MG PO TABS: 600.0000 mg | ORAL_TABLET | Freq: Two times a day (BID) | ORAL | Status: DC

## 2021-08-12 NOTE — Assessment & Plan Note (Signed)
Right lumpectomy 10/30/2015: IDC grade 2, 1.4 cm, IG DCIS, margins negative, 0/2 lymph nodes negative, ER 100%, PR 0%, HER-2 negative, Ki-67 60 %, T1 cN0 stage IA ?Oncotype DX score 30, 20% risk of recurrence ?? ?Treatment plan: ?1. Adjuvant chemotherapy With Taxotere and Cytoxan ?4 started 11/25/2015 completed 01/27/2016 ?2. Followed by adjuvant radiation therapy completed 03/24/16 ?3. Followed by adjuvant antiestrogen therapy with Anastrozole 1 mg daily Started January 2018, switched to letrozole,?prescribed?exemestane 03/23/2017 but she did not take it ?------------------------------------------------------------------------------------------------------------------- ?Surveillance: ?1.?Mammogram?and ultrasound 03/20/2021:?Benign postoperative changes including fat necrosis in the right breast at surgical site.  No sonographic evidence of malignancy.  Skin discoloration could be evaluated with a punch biopsy breast density category B ?2.?breast exam?08/12/2021: Benign  ?? 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 ?? ?Arthritis in her knees and muscle aches and pains: Continues to be a problem ?We discussed that since she completed 5 years of follow-up she could be followed by her primary care physician.  However she wants to follow with as for the time being. ?? ?Return to clinic in?1 yr?for follow-up? ?

## 2021-10-01 DIAGNOSIS — N3941 Urge incontinence: Secondary | ICD-10-CM | POA: Diagnosis not present

## 2021-10-01 DIAGNOSIS — R350 Frequency of micturition: Secondary | ICD-10-CM | POA: Diagnosis not present

## 2021-10-02 DIAGNOSIS — E785 Hyperlipidemia, unspecified: Secondary | ICD-10-CM | POA: Diagnosis not present

## 2021-10-02 DIAGNOSIS — I10 Essential (primary) hypertension: Secondary | ICD-10-CM | POA: Diagnosis not present

## 2021-10-02 DIAGNOSIS — R5383 Other fatigue: Secondary | ICD-10-CM | POA: Diagnosis not present

## 2021-10-02 DIAGNOSIS — E119 Type 2 diabetes mellitus without complications: Secondary | ICD-10-CM | POA: Diagnosis not present

## 2021-10-09 DIAGNOSIS — E785 Hyperlipidemia, unspecified: Secondary | ICD-10-CM | POA: Diagnosis not present

## 2021-10-09 DIAGNOSIS — E118 Type 2 diabetes mellitus with unspecified complications: Secondary | ICD-10-CM | POA: Diagnosis not present

## 2021-10-09 DIAGNOSIS — M1990 Unspecified osteoarthritis, unspecified site: Secondary | ICD-10-CM | POA: Diagnosis not present

## 2021-10-09 DIAGNOSIS — N3281 Overactive bladder: Secondary | ICD-10-CM | POA: Diagnosis not present

## 2021-10-09 DIAGNOSIS — I10 Essential (primary) hypertension: Secondary | ICD-10-CM | POA: Diagnosis not present

## 2021-10-09 DIAGNOSIS — K219 Gastro-esophageal reflux disease without esophagitis: Secondary | ICD-10-CM | POA: Diagnosis not present

## 2021-10-14 DIAGNOSIS — M5416 Radiculopathy, lumbar region: Secondary | ICD-10-CM | POA: Insufficient documentation

## 2021-10-14 DIAGNOSIS — E785 Hyperlipidemia, unspecified: Secondary | ICD-10-CM | POA: Insufficient documentation

## 2021-10-14 DIAGNOSIS — I700 Atherosclerosis of aorta: Secondary | ICD-10-CM | POA: Diagnosis not present

## 2021-11-05 DIAGNOSIS — M5416 Radiculopathy, lumbar region: Secondary | ICD-10-CM | POA: Diagnosis not present

## 2021-11-05 DIAGNOSIS — M5451 Vertebrogenic low back pain: Secondary | ICD-10-CM | POA: Diagnosis not present

## 2021-11-10 DIAGNOSIS — M5451 Vertebrogenic low back pain: Secondary | ICD-10-CM | POA: Diagnosis not present

## 2021-11-10 DIAGNOSIS — M5416 Radiculopathy, lumbar region: Secondary | ICD-10-CM | POA: Diagnosis not present

## 2021-11-12 DIAGNOSIS — M170 Bilateral primary osteoarthritis of knee: Secondary | ICD-10-CM | POA: Diagnosis not present

## 2021-11-17 DIAGNOSIS — M5451 Vertebrogenic low back pain: Secondary | ICD-10-CM | POA: Diagnosis not present

## 2021-11-17 DIAGNOSIS — M5416 Radiculopathy, lumbar region: Secondary | ICD-10-CM | POA: Diagnosis not present

## 2021-11-19 DIAGNOSIS — M5416 Radiculopathy, lumbar region: Secondary | ICD-10-CM | POA: Diagnosis not present

## 2021-11-19 DIAGNOSIS — M5451 Vertebrogenic low back pain: Secondary | ICD-10-CM | POA: Diagnosis not present

## 2021-11-24 DIAGNOSIS — M5451 Vertebrogenic low back pain: Secondary | ICD-10-CM | POA: Diagnosis not present

## 2021-11-24 DIAGNOSIS — M5416 Radiculopathy, lumbar region: Secondary | ICD-10-CM | POA: Diagnosis not present

## 2021-11-26 DIAGNOSIS — M5416 Radiculopathy, lumbar region: Secondary | ICD-10-CM | POA: Diagnosis not present

## 2021-11-26 DIAGNOSIS — M5451 Vertebrogenic low back pain: Secondary | ICD-10-CM | POA: Diagnosis not present

## 2021-12-01 DIAGNOSIS — M5416 Radiculopathy, lumbar region: Secondary | ICD-10-CM | POA: Diagnosis not present

## 2021-12-01 DIAGNOSIS — M5451 Vertebrogenic low back pain: Secondary | ICD-10-CM | POA: Diagnosis not present

## 2021-12-03 DIAGNOSIS — M5451 Vertebrogenic low back pain: Secondary | ICD-10-CM | POA: Diagnosis not present

## 2021-12-03 DIAGNOSIS — M5416 Radiculopathy, lumbar region: Secondary | ICD-10-CM | POA: Diagnosis not present

## 2021-12-18 DIAGNOSIS — M5451 Vertebrogenic low back pain: Secondary | ICD-10-CM | POA: Diagnosis not present

## 2021-12-18 DIAGNOSIS — M5416 Radiculopathy, lumbar region: Secondary | ICD-10-CM | POA: Diagnosis not present

## 2022-01-26 IMAGING — MG MM DIGITAL DIAGNOSTIC UNILAT*R* W/ TOMO W/ CAD
4 series · 4 of 12 positions shown · non-contrast
Comparison: Previous exam(s).

CLINICAL DATA: 79-year-old female presenting with an area of
discoloration at her surgical scar. History of right lumpectomy in
[id number].

EXAM:
DIGITAL DIAGNOSTIC UNILATERAL RIGHT MAMMOGRAM WITH TOMOSYNTHESIS AND
CAD; ULTRASOUND RIGHT BREAST LIMITED
TECHNIQUE: Right digital diagnostic mammography and breast tomosynthesis was
performed. The images were evaluated with computer-aided detection.;
Targeted ultrasound examination of the right breast was performed

[R MLO synth-2D]
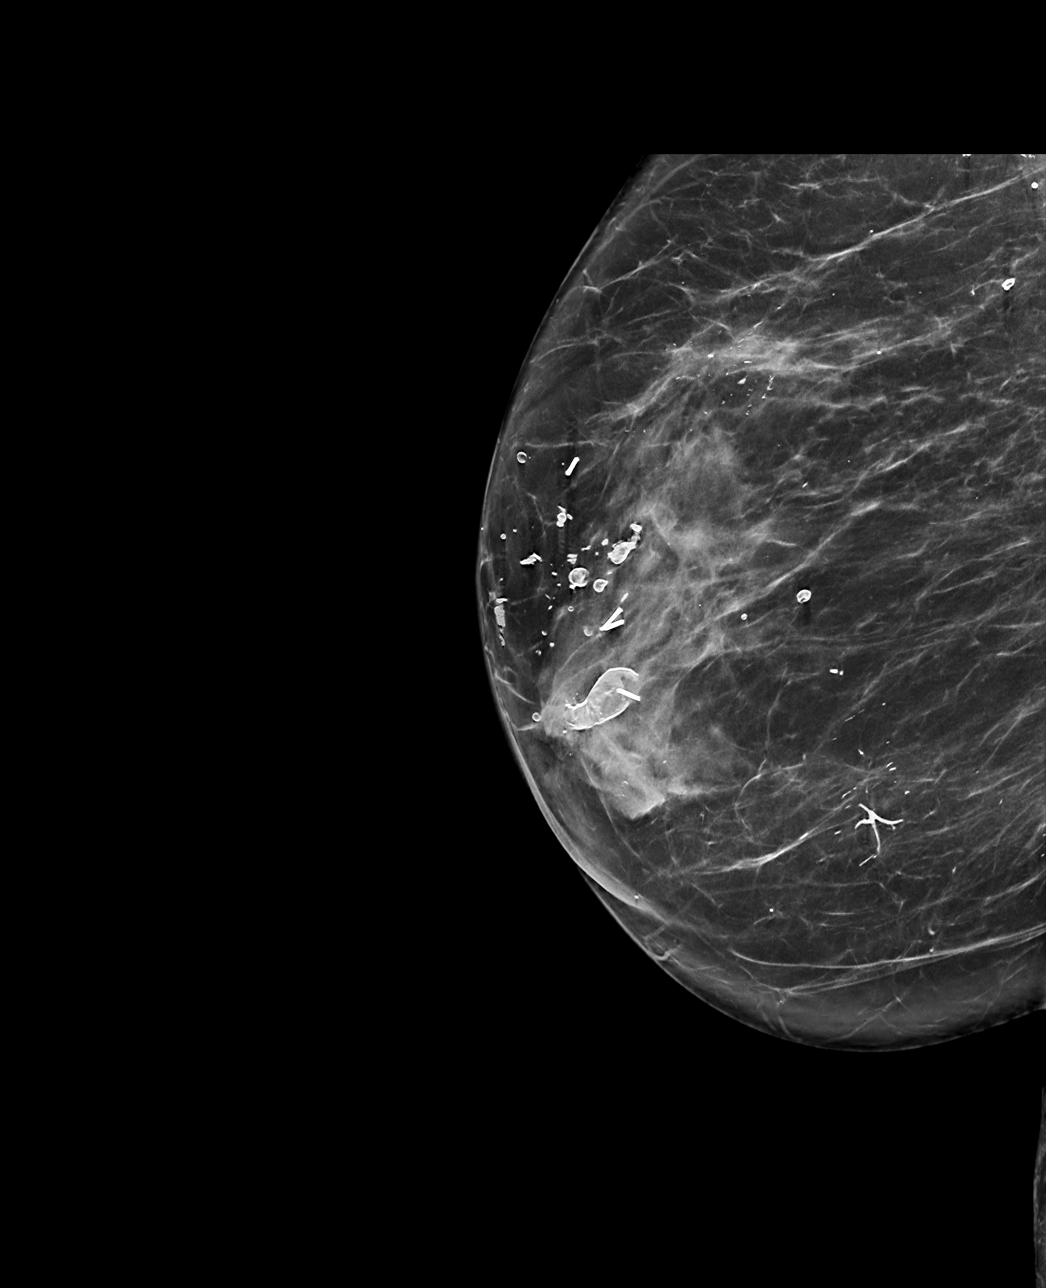

[R CC synth-2D]
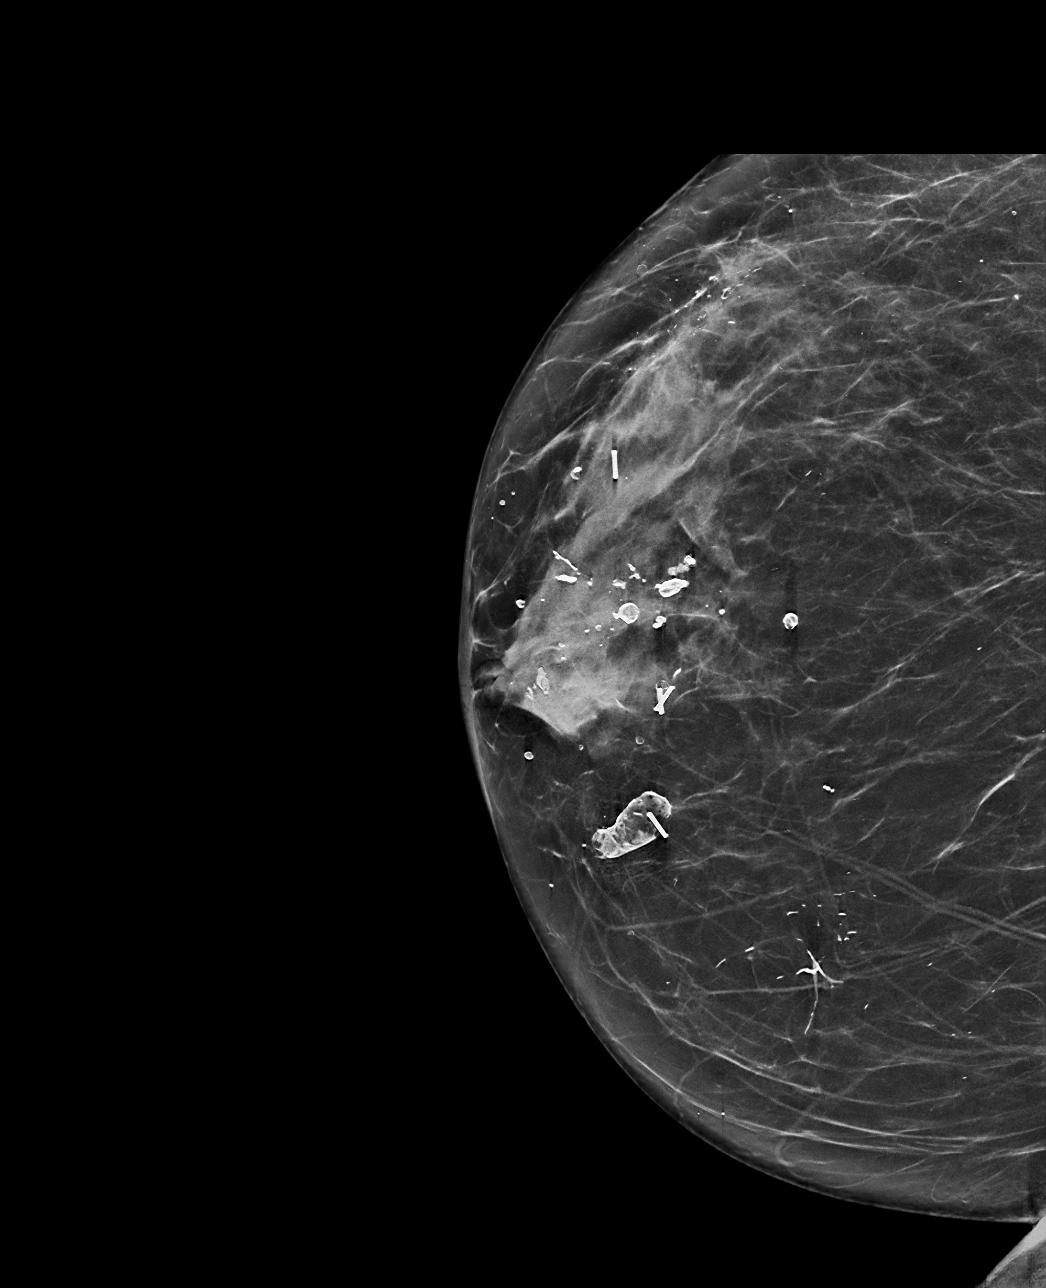

[R CC tomo · tomo slice 37/73.0]
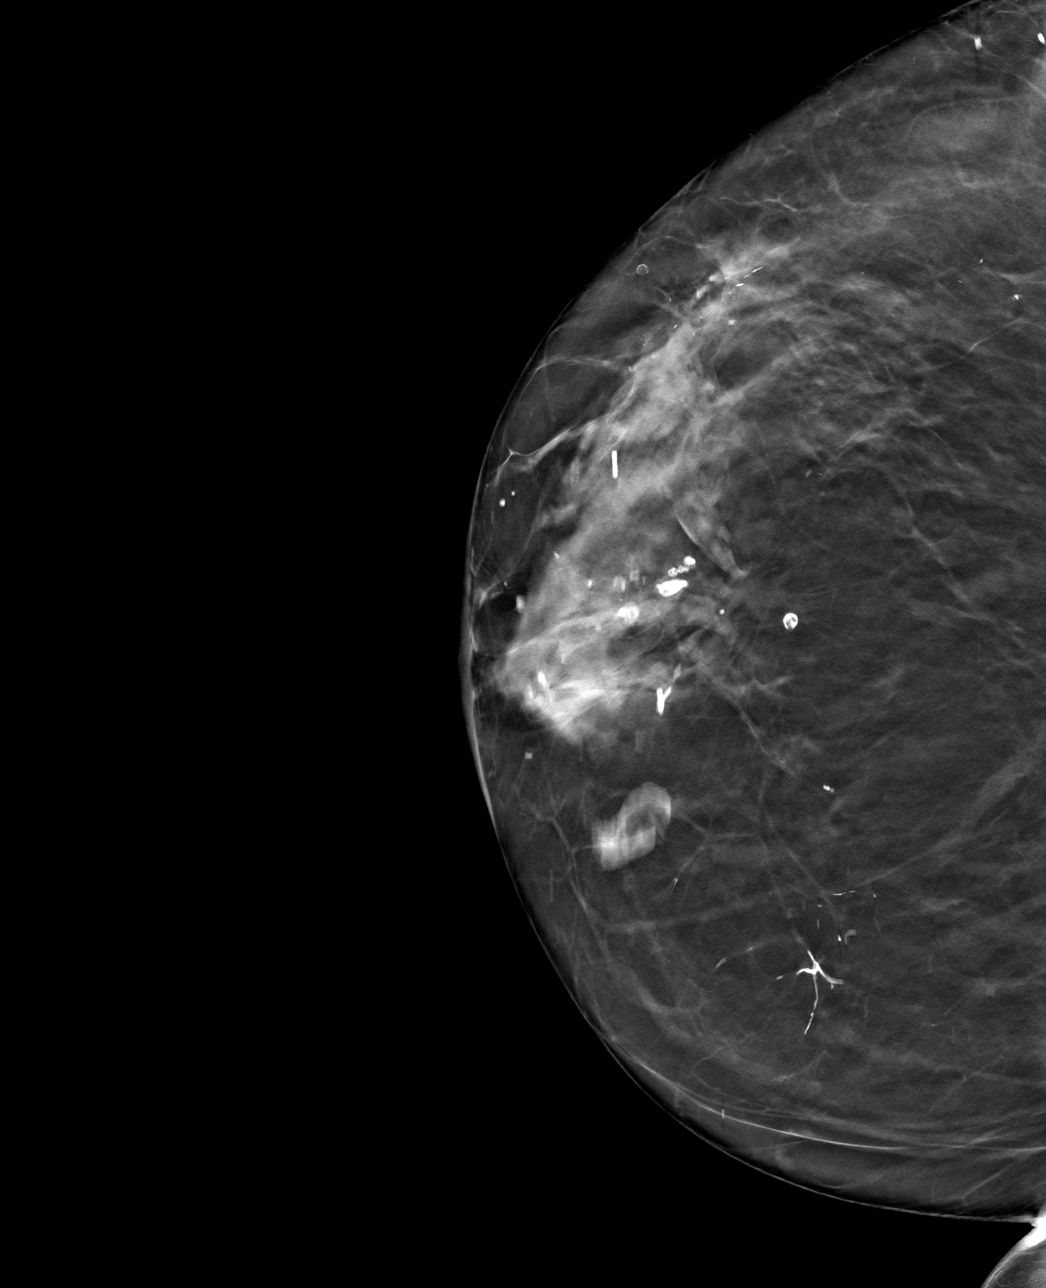

[R MLO tomo · tomo slice 45/89.0]
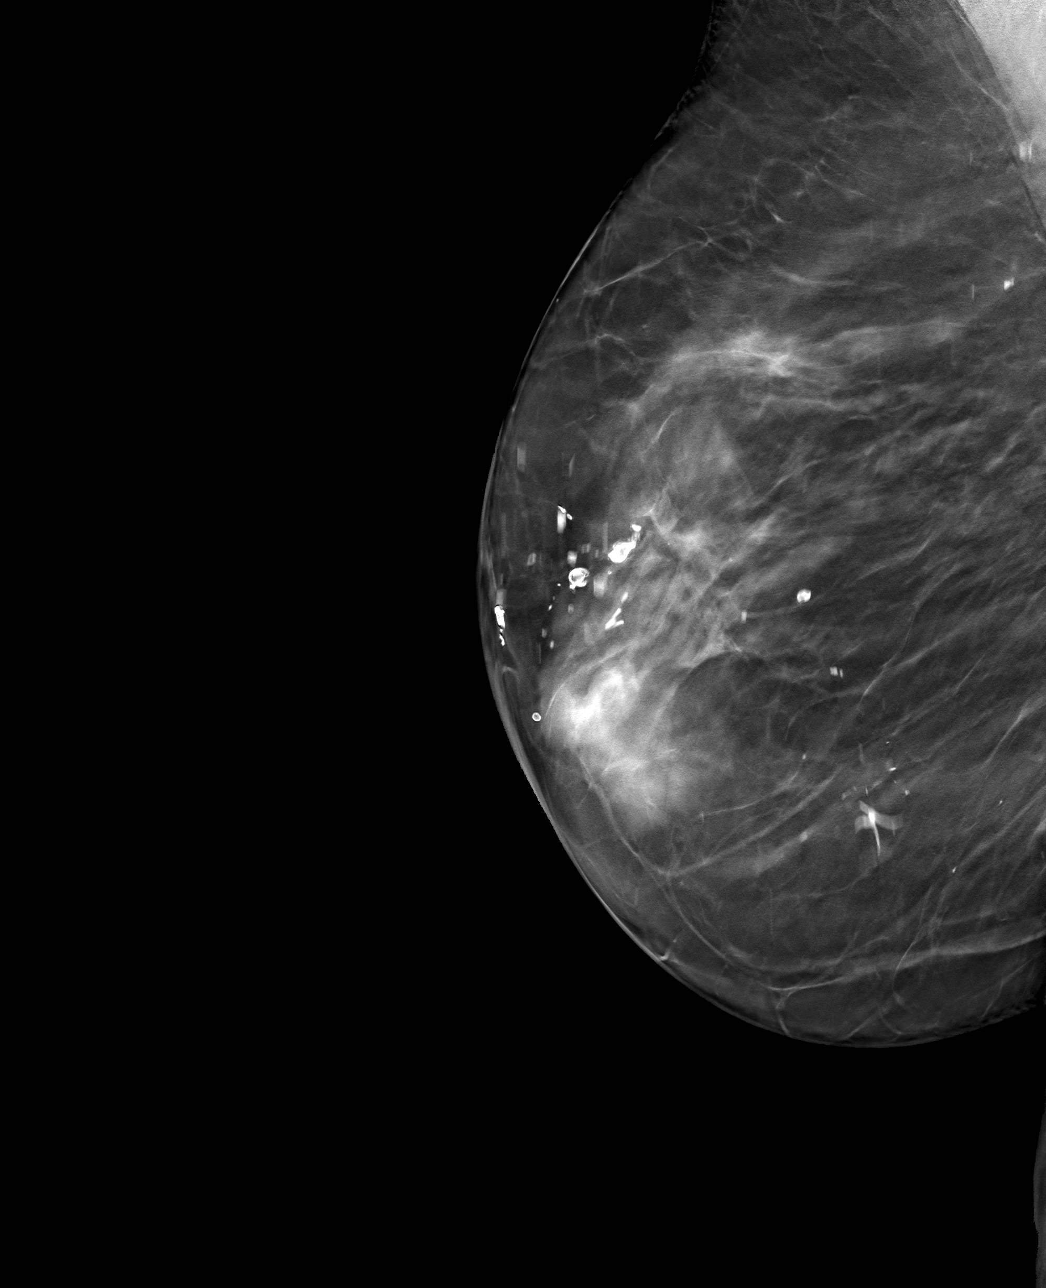

[4 of 12 positions shown; findings below may reference images not displayed]

ACR Breast Density Category b: There are scattered areas of
fibroglandular density.
FINDINGS: Mammogram:

Full field tomosynthesis views of the right breast were performed.
There is no new abnormality at the surgical site. There are benign
postsurgical changes including fat necrosis and dystrophic
calcification.

On physical exam of the right breast there is a small area of
discoloration along the scar. No discrete mass palpated.

Ultrasound:

Targeted ultrasound performed over the site of discoloration in the
right breast at the scar site demonstrating superficial fat
necrosis. There is no suspicious solid mass.
IMPRESSION: Benign postsurgical changes including fat necrosis in the right
breast at the surgical site. There is no mammographic or sonographic
evidence of malignancy.

RECOMMENDATION:
1. Recommend any further workup of the skin discoloration be on a
clinical basis. If there is continued clinical concern consider skin
punch biopsy.

2. Return for routine annual screening mammography which will be due
in January 2022.

I have discussed the findings and recommendations with the patient.
If applicable, a reminder letter will be sent to the patient
regarding the next appointment.

BI-RADS CATEGORY  2: Benign.

## 2022-02-13 DIAGNOSIS — M25562 Pain in left knee: Secondary | ICD-10-CM | POA: Diagnosis not present

## 2022-02-13 DIAGNOSIS — M1712 Unilateral primary osteoarthritis, left knee: Secondary | ICD-10-CM | POA: Diagnosis not present

## 2022-02-13 DIAGNOSIS — C50411 Malignant neoplasm of upper-outer quadrant of right female breast: Secondary | ICD-10-CM | POA: Diagnosis not present

## 2022-02-13 DIAGNOSIS — M1711 Unilateral primary osteoarthritis, right knee: Secondary | ICD-10-CM | POA: Diagnosis not present

## 2022-02-13 DIAGNOSIS — Z170 Estrogen receptor positive status [ER+]: Secondary | ICD-10-CM | POA: Diagnosis not present

## 2022-02-13 DIAGNOSIS — M25561 Pain in right knee: Secondary | ICD-10-CM | POA: Diagnosis not present

## 2022-03-06 ENCOUNTER — Other Ambulatory Visit: Payer: Self-pay | Admitting: General Surgery

## 2022-03-06 DIAGNOSIS — Z1231 Encounter for screening mammogram for malignant neoplasm of breast: Secondary | ICD-10-CM

## 2022-04-06 DIAGNOSIS — E876 Hypokalemia: Secondary | ICD-10-CM | POA: Diagnosis not present

## 2022-04-06 DIAGNOSIS — J309 Allergic rhinitis, unspecified: Secondary | ICD-10-CM | POA: Diagnosis not present

## 2022-04-06 DIAGNOSIS — I10 Essential (primary) hypertension: Secondary | ICD-10-CM | POA: Diagnosis not present

## 2022-04-06 DIAGNOSIS — E785 Hyperlipidemia, unspecified: Secondary | ICD-10-CM | POA: Diagnosis not present

## 2022-04-06 DIAGNOSIS — E1169 Type 2 diabetes mellitus with other specified complication: Secondary | ICD-10-CM | POA: Diagnosis not present

## 2022-04-06 DIAGNOSIS — I700 Atherosclerosis of aorta: Secondary | ICD-10-CM | POA: Diagnosis not present

## 2022-04-06 DIAGNOSIS — M1990 Unspecified osteoarthritis, unspecified site: Secondary | ICD-10-CM | POA: Diagnosis not present

## 2022-04-06 DIAGNOSIS — K219 Gastro-esophageal reflux disease without esophagitis: Secondary | ICD-10-CM | POA: Diagnosis not present

## 2022-04-06 DIAGNOSIS — H401132 Primary open-angle glaucoma, bilateral, moderate stage: Secondary | ICD-10-CM | POA: Diagnosis not present

## 2022-04-06 DIAGNOSIS — E1151 Type 2 diabetes mellitus with diabetic peripheral angiopathy without gangrene: Secondary | ICD-10-CM | POA: Diagnosis not present

## 2022-04-06 DIAGNOSIS — E1139 Type 2 diabetes mellitus with other diabetic ophthalmic complication: Secondary | ICD-10-CM | POA: Diagnosis not present

## 2022-04-07 ENCOUNTER — Ambulatory Visit
Admission: RE | Admit: 2022-04-07 | Discharge: 2022-04-07 | Disposition: A | Discharge status: Home / Self Care | Payer: PPO | Source: Ambulatory Visit | Attending: General Surgery | Admitting: General Surgery

## 2022-04-07 DIAGNOSIS — Z1231 Encounter for screening mammogram for malignant neoplasm of breast: Secondary | ICD-10-CM

## 2022-04-10 ENCOUNTER — Other Ambulatory Visit: Payer: Self-pay | Admitting: General Surgery

## 2022-04-10 DIAGNOSIS — R928 Other abnormal and inconclusive findings on diagnostic imaging of breast: Secondary | ICD-10-CM

## 2022-04-20 ENCOUNTER — Other Ambulatory Visit: Payer: Self-pay | Admitting: General Surgery

## 2022-04-20 ENCOUNTER — Ambulatory Visit
Admission: RE | Admit: 2022-04-20 | Discharge: 2022-04-20 | Disposition: A | Discharge status: Home / Self Care | Payer: PPO | Source: Ambulatory Visit | Attending: General Surgery | Admitting: General Surgery

## 2022-04-20 DIAGNOSIS — R928 Other abnormal and inconclusive findings on diagnostic imaging of breast: Secondary | ICD-10-CM

## 2022-04-20 HISTORY — DX: Personal history of antineoplastic chemotherapy: Z92.21

## 2022-05-05 ENCOUNTER — Ambulatory Visit
Admission: RE | Admit: 2022-05-05 | Discharge: 2022-05-05 | Disposition: A | Discharge status: Home / Self Care | Payer: PPO | Source: Ambulatory Visit | Attending: General Surgery | Admitting: General Surgery

## 2022-05-05 DIAGNOSIS — R928 Other abnormal and inconclusive findings on diagnostic imaging of breast: Secondary | ICD-10-CM

## 2022-05-05 DIAGNOSIS — R921 Mammographic calcification found on diagnostic imaging of breast: Secondary | ICD-10-CM | POA: Diagnosis not present

## 2022-05-05 DIAGNOSIS — N6489 Other specified disorders of breast: Secondary | ICD-10-CM | POA: Diagnosis not present

## 2022-05-05 HISTORY — PX: BREAST BIOPSY: SHX20

## 2022-05-07 DIAGNOSIS — R7989 Other specified abnormal findings of blood chemistry: Secondary | ICD-10-CM | POA: Diagnosis not present

## 2022-05-07 DIAGNOSIS — E7800 Pure hypercholesterolemia, unspecified: Secondary | ICD-10-CM | POA: Diagnosis not present

## 2022-05-07 DIAGNOSIS — K219 Gastro-esophageal reflux disease without esophagitis: Secondary | ICD-10-CM | POA: Diagnosis not present

## 2022-05-07 DIAGNOSIS — M170 Bilateral primary osteoarthritis of knee: Secondary | ICD-10-CM | POA: Diagnosis not present

## 2022-05-07 DIAGNOSIS — E119 Type 2 diabetes mellitus without complications: Secondary | ICD-10-CM | POA: Diagnosis not present

## 2022-05-07 DIAGNOSIS — M25561 Pain in right knee: Secondary | ICD-10-CM | POA: Diagnosis not present

## 2022-05-07 DIAGNOSIS — R32 Unspecified urinary incontinence: Secondary | ICD-10-CM | POA: Diagnosis not present

## 2022-05-07 DIAGNOSIS — M25562 Pain in left knee: Secondary | ICD-10-CM | POA: Diagnosis not present

## 2022-05-07 DIAGNOSIS — I10 Essential (primary) hypertension: Secondary | ICD-10-CM | POA: Diagnosis not present

## 2022-05-07 DIAGNOSIS — E559 Vitamin D deficiency, unspecified: Secondary | ICD-10-CM | POA: Diagnosis not present

## 2022-05-14 DIAGNOSIS — I10 Essential (primary) hypertension: Secondary | ICD-10-CM | POA: Diagnosis not present

## 2022-05-14 DIAGNOSIS — M1990 Unspecified osteoarthritis, unspecified site: Secondary | ICD-10-CM | POA: Diagnosis not present

## 2022-05-14 DIAGNOSIS — I350 Nonrheumatic aortic (valve) stenosis: Secondary | ICD-10-CM | POA: Diagnosis not present

## 2022-05-14 DIAGNOSIS — Z9109 Other allergy status, other than to drugs and biological substances: Secondary | ICD-10-CM | POA: Diagnosis not present

## 2022-05-14 DIAGNOSIS — E118 Type 2 diabetes mellitus with unspecified complications: Secondary | ICD-10-CM | POA: Diagnosis not present

## 2022-05-14 DIAGNOSIS — N3281 Overactive bladder: Secondary | ICD-10-CM | POA: Diagnosis not present

## 2022-05-14 DIAGNOSIS — Z0000 Encounter for general adult medical examination without abnormal findings: Secondary | ICD-10-CM | POA: Diagnosis not present

## 2022-05-14 DIAGNOSIS — E7800 Pure hypercholesterolemia, unspecified: Secondary | ICD-10-CM | POA: Diagnosis not present

## 2022-05-14 DIAGNOSIS — E559 Vitamin D deficiency, unspecified: Secondary | ICD-10-CM | POA: Diagnosis not present

## 2022-05-14 DIAGNOSIS — H409 Unspecified glaucoma: Secondary | ICD-10-CM | POA: Diagnosis not present

## 2022-05-26 DIAGNOSIS — C50411 Malignant neoplasm of upper-outer quadrant of right female breast: Secondary | ICD-10-CM | POA: Diagnosis not present

## 2022-05-26 DIAGNOSIS — Z170 Estrogen receptor positive status [ER+]: Secondary | ICD-10-CM | POA: Diagnosis not present

## 2022-05-26 DIAGNOSIS — N6081 Other benign mammary dysplasias of right breast: Secondary | ICD-10-CM | POA: Diagnosis not present

## 2022-06-19 DIAGNOSIS — N3941 Urge incontinence: Secondary | ICD-10-CM | POA: Diagnosis not present

## 2022-06-19 DIAGNOSIS — R350 Frequency of micturition: Secondary | ICD-10-CM | POA: Diagnosis not present

## 2022-06-24 DIAGNOSIS — I350 Nonrheumatic aortic (valve) stenosis: Secondary | ICD-10-CM | POA: Diagnosis not present

## 2022-06-29 ENCOUNTER — Other Ambulatory Visit (HOSPITAL_COMMUNITY): Payer: Self-pay

## 2022-06-29 MED ORDER — FREESTYLE LIBRE 14 DAY READER DEVI
3 refills | Status: DC
  Filled 2022-06-29: qty 1, 14d supply, fill #0

## 2022-06-29 MED ORDER — FREESTYLE LIBRE 3 SENSOR MISC
1.0000 | 4 refills | Status: DC
  Filled 2022-06-29: qty 6, 84d supply, fill #0
  Filled 2022-07-01 – 2022-09-03 (×2): qty 6, 84d supply, fill #1
  Filled 2022-12-03: qty 6, 84d supply, fill #2
  Filled 2023-04-26: qty 6, 84d supply, fill #3

## 2022-06-30 ENCOUNTER — Other Ambulatory Visit (HOSPITAL_COMMUNITY): Payer: Self-pay

## 2022-06-30 DIAGNOSIS — H401132 Primary open-angle glaucoma, bilateral, moderate stage: Secondary | ICD-10-CM | POA: Diagnosis not present

## 2022-06-30 DIAGNOSIS — H35033 Hypertensive retinopathy, bilateral: Secondary | ICD-10-CM | POA: Diagnosis not present

## 2022-06-30 DIAGNOSIS — H47233 Glaucomatous optic atrophy, bilateral: Secondary | ICD-10-CM | POA: Diagnosis not present

## 2022-06-30 DIAGNOSIS — E113213 Type 2 diabetes mellitus with mild nonproliferative diabetic retinopathy with macular edema, bilateral: Secondary | ICD-10-CM | POA: Diagnosis not present

## 2022-06-30 DIAGNOSIS — H35342 Macular cyst, hole, or pseudohole, left eye: Secondary | ICD-10-CM | POA: Diagnosis not present

## 2022-06-30 MED ORDER — FREESTYLE LIBRE 3 READER DEVI
4 refills | Status: AC
  Filled 2022-06-30: qty 1, 30d supply, fill #0

## 2022-07-01 ENCOUNTER — Other Ambulatory Visit (HOSPITAL_COMMUNITY): Payer: Self-pay

## 2022-07-01 MED ORDER — MOUNJARO 2.5 MG/0.5ML ~~LOC~~ SOAJ
2.5000 mg | SUBCUTANEOUS | 5 refills | Status: DC
  Filled 2022-07-01: qty 2, 28d supply, fill #0
  Filled 2022-07-29: qty 2, 28d supply, fill #1
  Filled 2022-09-03: qty 2, 28d supply, fill #2
  Filled 2022-10-06: qty 2, 28d supply, fill #3
  Filled 2022-10-30 – 2022-11-03 (×2): qty 2, 28d supply, fill #4
  Filled 2022-12-03: qty 2, 28d supply, fill #5

## 2022-07-14 ENCOUNTER — Other Ambulatory Visit (HOSPITAL_COMMUNITY): Payer: Self-pay

## 2022-07-14 DIAGNOSIS — R600 Localized edema: Secondary | ICD-10-CM | POA: Diagnosis not present

## 2022-07-14 DIAGNOSIS — T148XXA Other injury of unspecified body region, initial encounter: Secondary | ICD-10-CM | POA: Diagnosis not present

## 2022-07-14 DIAGNOSIS — E118 Type 2 diabetes mellitus with unspecified complications: Secondary | ICD-10-CM | POA: Diagnosis not present

## 2022-07-14 DIAGNOSIS — R011 Cardiac murmur, unspecified: Secondary | ICD-10-CM | POA: Diagnosis not present

## 2022-07-14 MED ORDER — FUROSEMIDE 20 MG PO TABS
20.0000 mg | ORAL_TABLET | Freq: Every day | ORAL | 1 refills | Status: DC
  Filled 2022-07-14: qty 30, 30d supply, fill #0
  Filled 2023-05-24: qty 28, 28d supply, fill #1

## 2022-07-15 ENCOUNTER — Other Ambulatory Visit (HOSPITAL_COMMUNITY): Payer: Self-pay

## 2022-07-31 DIAGNOSIS — M170 Bilateral primary osteoarthritis of knee: Secondary | ICD-10-CM | POA: Diagnosis not present

## 2022-08-04 DIAGNOSIS — N3941 Urge incontinence: Secondary | ICD-10-CM | POA: Diagnosis not present

## 2022-08-13 ENCOUNTER — Telehealth: Payer: Self-pay | Admitting: Hematology and Oncology

## 2022-08-13 ENCOUNTER — Other Ambulatory Visit: Payer: Self-pay

## 2022-08-13 ENCOUNTER — Inpatient Hospital Stay: Payer: PPO | Attending: Adult Health | Admitting: Adult Health

## 2022-08-13 ENCOUNTER — Encounter: Payer: Self-pay | Admitting: Adult Health

## 2022-08-13 VITALS — BP 159/80 | HR 90 | Temp 97.7°F | Resp 18 | Wt 192.2 lb

## 2022-08-13 DIAGNOSIS — E119 Type 2 diabetes mellitus without complications: Secondary | ICD-10-CM | POA: Insufficient documentation

## 2022-08-13 DIAGNOSIS — Z90710 Acquired absence of both cervix and uterus: Secondary | ICD-10-CM | POA: Insufficient documentation

## 2022-08-13 DIAGNOSIS — Z170 Estrogen receptor positive status [ER+]: Secondary | ICD-10-CM

## 2022-08-13 DIAGNOSIS — I10 Essential (primary) hypertension: Secondary | ICD-10-CM | POA: Diagnosis not present

## 2022-08-13 DIAGNOSIS — C50211 Malignant neoplasm of upper-inner quadrant of right female breast: Secondary | ICD-10-CM

## 2022-08-13 NOTE — Telephone Encounter (Signed)
Scheduled appointment per 3/7 los. Left voicemail.

## 2022-08-13 NOTE — Progress Notes (Signed)
[facility] Follow up:    Shelley Lawrence, Shelley Lawrence   DIAGNOSIS:  Cancer Staging  Breast cancer of upper-inner quadrant of right female breast [facility]) Staging form: Breast, AJCC 7th Edition - Clinical stage from 10/23/2015: Stage IA (T1c, N0, M0) - Unsigned Staged by: Pathologist and managing physician Laterality: Right Estrogen receptor status: Positive Progesterone receptor status: Negative HER2 status: Negative Stage used in treatment planning: Yes National guidelines used in treatment planning: Yes Type of national guideline used in treatment planning: NCCN - Pathologic: Stage IA (T1c, N0, cM0) - Unsigned   SUMMARY OF ONCOLOGIC HISTORY: Oncology History  Breast cancer of upper-inner quadrant of right female breast ([place])  10/14/2015 Initial Diagnosis   Screening detected Right breast mass at 1:00:1.1 x 0.8 x 0.7 cm,grade 2 IDC plus DCIS, ER 100 present, PR 0%, HER-2 negative ratio 1.64, Ki-67 60%, T1cN0 stage IA   10/30/2015 Surgery   Right lumpectomy: IDC grade 2, 1.4 cm, IG DCIS, margins negative, 0/2 lymph nodes negative, ER 100%, PR 0%, HER-2 negative, Ki-67 60 %, T1 cN0 stage IA, Oncotype DX score 30, 20% risk of recurrence   11/25/2015 - 01/27/2016 Chemotherapy   Adjuvant chemotherapy with Taxotere and Cytoxan every 3 weeks 4 cycles   03/16/2016 - 04/29/2016 Radiation Therapy   Adj XRT   06/09/2016 - 09/2016 Anti-estrogen oral therapy   Anastrozole 1 mg by mouth daily--unable to tolerate this or any other antiestrogen therapy     CURRENT THERAPY: observation  INTERVAL HISTORY: Shelley Lawrence 82 y.o. female returns for follow-up of her history of right-sided breast cancer.  Her most recent mammogram occurred on April 07, 2022 demonstrating right breast calcifications requiring further evaluation.  A diagnostic mammogram occurred on April 20, 2022 and recommended a breast biopsy which occurred on  May 05, 2022.  The biopsy on November 28 demonstrated benign breast parenchyma with mild ductal epithelial atypia with calcifications present and excision was recommended.  The patient was seen by Dr. Marlou Starks on May 26, 2022.  She had a heart murmur that required cardiac clearance, which she obtained.  She is busy this month with receiving a bladder stimulation device.   Her most recent bone density testing occurred in 2018 [facility] and was normal.    Patient Active Problem List   Diagnosis Date Noted   Abdominal aortic atherosclerosis ([place]) 10/14/2021   Lumbar radiculopathy 10/14/2021   Hyperlipidemia 10/14/2021   Breast cancer of upper-inner quadrant of right female breast ([place]) 10/16/2015   Onychomycosis 11/22/2012   Pain in joint, ankle and foot 11/22/2012   Hammer toe 11/22/2012   DIABETES MELLITUS-TYPE II 07/04/2009   ANEMIA-IRON DEFICIENCY 07/04/2009   ESOPHAGEAL STRICTURE 07/04/2009   GERD 07/04/2009   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    is allergic to horse-derived products.  MEDICAL HISTORY: Past Medical History:  Diagnosis Date   Allergy    Anemia    Arthritis    knees   Breast cancer ([place]) 10/14/2015   Right Breast   Broken foot    Carpal tunnel syndrome    Diabetes mellitus without complication (H[facility])    GERD (gastroesophageal reflux disease)    History of radiation therapy 03/16/16-04/29/16   Right breast/ 50.4 Gy in 28 fractions and right breast boost / 12 Gy in 6 fractions.   Hot flashes    Hypertension    Personal history of chemotherapy    Personal history  of radiation therapy    Port catheter in place 01/06/2016    SURGICAL HISTORY: Past Surgical History:  Procedure Laterality Date   ABDOMINAL HYSTERECTOMY     BACK SURGERY     BREAST BIOPSY Right 10/14/2015   U/S Core- Malignant   BREAST BIOPSY Right 05/05/2022   MM RT BREAST BX W LOC DEV 1ST LESION IMAGE BX SPEC STEREO GUIDE 05/05/2022 [facility]  MAMMOGRAPHY   BREAST BIOPSY Right 05/05/2022   MM RT BREAST BX W LOC DEV EA AD LESION IMG BX SPEC STEREO GUIDE 05/05/2022 [facility] MAMMOGRAPHY   BREAST LUMPECTOMY Right 10/30/2015   BREAST LUMPECTOMY WITH NEEDLE LOCALIZATION AND AXILLARY SENTINEL LYMPH NODE BX Right 10/30/2015   Procedure: BREAST LUMPECTOMY WITH NEEDLE LOCALIZATION AND AXILLARY SENTINEL LYMPH NODE BX;  Surgeon: Autumn Messing III, MD;  Location: [facility];  Service: General;  Laterality: Right;   CARPAL TUNNEL RELEASE Right    KNEE SURGERY     OVARY SURGERY     removal of ovarian tumor   PORT-A-CATH REMOVAL Left 10/18/2017   Procedure: REMOVAL PORT-A-CATH;  Surgeon: Jovita Kussmaul, MD;  Location: [facility];  Service: General;  Laterality: Left;   PORTACATH PLACEMENT Left 11/21/2015   Procedure: INSERTION PORT-A-CATH;  Surgeon: Autumn Messing III, MD;  Location: [facility];  Service: General;  Laterality: Left;   TONSILECTOMY/ADENOIDECTOMY WITH MYRINGOTOMY     TUBAL LIGATION      SOCIAL HISTORY: Social History   Socioeconomic History   Marital status: Widowed    Spouse name: Not on file   Number of children: Not on file   Years of education: Not on file   Highest education level: Not on file  Occupational History   Occupation: office worker  Tobacco Use   Smoking status: Never   Smokeless tobacco: Never  Substance and Sexual Activity   Alcohol use: No   Drug use: No   Sexual activity: Not on file  Other Topics Concern   Not on file  Social History Narrative   Not on file   Social Determinants of Health   Financial Resource Strain: Not on file  Food Insecurity: Not on file  Transportation Needs: Not on file  Physical Activity: Not on file  Stress: Not on file  Social Connections: Not on file  Intimate Partner Violence: Not on file    FAMILY HISTORY: History reviewed. No pertinent family history.  Review of Systems  Constitutional:  Negative for appetite change,  chills, fatigue, fever and unexpected weight change.  HENT:   Negative for hearing loss, lump/mass and trouble swallowing.   Eyes:  Negative for eye problems and icterus.  Respiratory:  Negative for chest tightness, cough and shortness of breath.   Cardiovascular:  Negative for chest pain, leg swelling and palpitations.  Gastrointestinal:  Negative for abdominal distention, abdominal pain, constipation, diarrhea, nausea and vomiting.  Endocrine: Negative for hot flashes.  Genitourinary:  Negative for difficulty urinating.   Musculoskeletal:  Negative for arthralgias.  Skin:  Negative for itching and rash.  Neurological:  Negative for dizziness, extremity weakness, headaches and numbness.  Hematological:  Negative for adenopathy. Does not bruise/bleed easily.  Psychiatric/Behavioral:  Negative for depression. The patient is not nervous/anxious.       PHYSICAL EXAMINATION  ECOG PERFORMANCE STATUS: 1 - Symptomatic but completely ambulatory  Vitals:   08/13/22 1015  BP: (!) 159/80  Pulse: 90  Resp: 18  Temp: 97.7 F (36.5 C)  SpO2: 97%  Physical Exam Constitutional:      General: She is not in acute distress.    Appearance: Normal appearance. She is not toxic-appearing.  HENT:     Head: Normocephalic and atraumatic.  Eyes:     General: No scleral icterus. Cardiovascular:     Rate and Rhythm: Normal rate and regular rhythm.     Pulses: Normal pulses.     Heart sounds: Murmur heard.  Pulmonary:     Effort: Pulmonary effort is normal.     Breath sounds: Normal breath sounds.  Chest:     Comments: Right breast status postlumpectomy and radiation no sign of local recurrence.  There is some swelling present which is consistent with the trauma she described that occurred with her previous right breast biopsy.  Left breast is benign. Abdominal:     General: Abdomen is flat. Bowel sounds are normal. There is no distension.     Palpations: Abdomen is soft.     Tenderness: There  is no abdominal tenderness.  Musculoskeletal:        General: No swelling.     Cervical back: Neck supple.  Lymphadenopathy:     Cervical: No cervical adenopathy.  Skin:    General: Skin is warm and dry.     Findings: No rash.  Neurological:     General: No focal deficit present.     Mental Status: She is alert.  Psychiatric:        Mood and Affect: Mood normal.        Behavior: Behavior normal.     LABORATORY DATA:  None for this visit      ASSESSMENT and THERAPY PLAN:   Breast cancer of upper-inner quadrant of right female breast ([place]) Kaitylyn is an 82 year old woman with history of stage Ia ER positive breast cancer diagnosed in 2017 status postlumpectomy followed by adjuvant radiation and adjuvant antiestrogen therapy with anastrozole which she took for 3 months and discontinued due to difficulty tolerating.  Salwa has atypical cells and calcifications that were seen in the right breast and biopsied.  Excision has been recommended.  I encouraged her to proceed with getting in with Dr. Marlou Starks now that she has received her cardiac clearance to get the excision scheduled.  She plans to do this in the next 1 to 2 months that she has other procedures going on right now (bladder stimulator).  She is working on [employer] and activity and is recently started [facility] for both her diabetes and to perhaps help lose weight.  I congratulated her on this and encouraged her in this new endeavor.  We will see her back in 1 year for labs, follow-up, and her next appointment.  All questions were answered. The patient knows to call the clinic with any problems, questions or concerns. We can certainly see the patient much sooner if necessary.  Total encounter time:30 minutes*in face-to-face visit time, chart review, lab review, care coordination, order entry, and documentation of the encounter time.  Wilber Bihari, NP 08/13/22 12:25 PM Medical Oncology and Hematology [facility] [place], [place] [address] Tel. [phone]    Fax. [phone]  *Total Encounter Time as defined by the Centers for Medicare and Medicaid Services includes, in addition to the face-to-face time of a patient visit (documented in the note above) non-face-to-face time: obtaining and reviewing outside history, ordering and reviewing medications, tests or procedures, care coordination (communications with other health care professionals or caregivers) and documentation in the  medical record.

## 2022-08-13 NOTE — Assessment & Plan Note (Signed)
Shelley Lawrence is an 82 year old woman with history of stage Ia ER positive breast cancer diagnosed in 2017 status postlumpectomy followed by adjuvant radiation and adjuvant antiestrogen therapy with anastrozole which she took for 3 months and discontinued due to difficulty tolerating.  Shelley Lawrence has atypical cells and calcifications that were seen in the right breast and biopsied.  Excision has been recommended.  I encouraged her to proceed with getting in with Dr. Marlou Starks now that she has received her cardiac clearance to get the excision scheduled.  She plans to do this in the next 1 to 2 months that she has other procedures going on right now (bladder stimulator).  She is working on [employer] and activity and is recently started [facility] for both her diabetes and to perhaps help lose weight.  I congratulated her on this and encouraged her in this new endeavor.  We will see her back in 1 year for labs, follow-up, and her next appointment.

## 2022-08-14 ENCOUNTER — Other Ambulatory Visit (HOSPITAL_COMMUNITY): Payer: Self-pay

## 2022-08-14 DIAGNOSIS — N3941 Urge incontinence: Secondary | ICD-10-CM | POA: Diagnosis not present

## 2022-08-14 MED ORDER — HYDROCODONE-ACETAMINOPHEN 5-325 MG PO TABS
1.0000 | ORAL_TABLET | Freq: Four times a day (QID) | ORAL | 0 refills | Status: DC
  Filled 2022-08-14: qty 10, 2d supply, fill #0

## 2022-08-14 MED ORDER — SULFAMETHOXAZOLE-TRIMETHOPRIM 800-160 MG PO TABS
1.0000 | ORAL_TABLET | Freq: Two times a day (BID) | ORAL | 0 refills | Status: DC
  Filled 2022-08-14: qty 10, 5d supply, fill #0

## 2022-08-20 ENCOUNTER — Ambulatory Visit: Payer: Self-pay | Admitting: General Surgery

## 2022-08-20 DIAGNOSIS — N6011 Diffuse cystic mastopathy of right breast: Secondary | ICD-10-CM

## 2022-08-20 MED ORDER — KETOROLAC TROMETHAMINE 15 MG/ML IJ SOLN: 15.0000 mg | Freq: Once | INTRAMUSCULAR | Status: AC

## 2022-08-21 DIAGNOSIS — R350 Frequency of micturition: Secondary | ICD-10-CM | POA: Diagnosis not present

## 2022-08-21 DIAGNOSIS — N3941 Urge incontinence: Secondary | ICD-10-CM | POA: Diagnosis not present

## 2022-09-01 ENCOUNTER — Other Ambulatory Visit (HOSPITAL_COMMUNITY): Payer: Self-pay

## 2022-09-03 ENCOUNTER — Other Ambulatory Visit (HOSPITAL_COMMUNITY): Payer: Self-pay

## 2022-09-03 MED ORDER — TRAMADOL HCL 50 MG PO TABS
100.0000 mg | ORAL_TABLET | Freq: Three times a day (TID) | ORAL | 0 refills | Status: DC | PRN
  Filled 2022-09-03: qty 540, 90d supply, fill #0

## 2022-09-04 ENCOUNTER — Other Ambulatory Visit (HOSPITAL_COMMUNITY): Payer: Self-pay

## 2022-09-04 MED ORDER — LOSARTAN POTASSIUM 25 MG PO TABS
25.0000 mg | ORAL_TABLET | Freq: Every day | ORAL | 1 refills | Status: DC
  Filled 2022-09-04: qty 90, 90d supply, fill #0
  Filled 2022-12-03: qty 90, 90d supply, fill #1

## 2022-09-10 ENCOUNTER — Other Ambulatory Visit (HOSPITAL_COMMUNITY): Payer: Self-pay

## 2022-09-15 ENCOUNTER — Other Ambulatory Visit (HOSPITAL_COMMUNITY): Payer: Self-pay

## 2022-09-15 MED ORDER — ATORVASTATIN CALCIUM 40 MG PO TABS
40.0000 mg | ORAL_TABLET | Freq: Every day | ORAL | 3 refills | Status: DC
  Filled 2022-09-15: qty 90, 90d supply, fill #0
  Filled 2022-12-03: qty 90, 90d supply, fill #1
  Filled 2023-03-17: qty 90, 90d supply, fill #2
  Filled 2023-05-24: qty 4, 4d supply, fill #3
  Filled 2023-06-17: qty 90, 90d supply, fill #3

## 2022-09-24 DIAGNOSIS — N3941 Urge incontinence: Secondary | ICD-10-CM | POA: Diagnosis not present

## 2022-09-24 DIAGNOSIS — R350 Frequency of micturition: Secondary | ICD-10-CM | POA: Diagnosis not present

## 2022-09-24 DIAGNOSIS — R351 Nocturia: Secondary | ICD-10-CM | POA: Diagnosis not present

## 2022-09-29 DIAGNOSIS — N6081 Other benign mammary dysplasias of right breast: Secondary | ICD-10-CM | POA: Diagnosis not present

## 2022-09-29 DIAGNOSIS — C50411 Malignant neoplasm of upper-outer quadrant of right female breast: Secondary | ICD-10-CM | POA: Diagnosis not present

## 2022-09-29 DIAGNOSIS — Z170 Estrogen receptor positive status [ER+]: Secondary | ICD-10-CM | POA: Diagnosis not present

## 2022-10-02 ENCOUNTER — Other Ambulatory Visit: Payer: Self-pay | Admitting: General Surgery

## 2022-10-02 DIAGNOSIS — N6081 Other benign mammary dysplasias of right breast: Secondary | ICD-10-CM

## 2022-10-06 ENCOUNTER — Other Ambulatory Visit (HOSPITAL_COMMUNITY): Payer: Self-pay

## 2022-10-27 ENCOUNTER — Other Ambulatory Visit (HOSPITAL_COMMUNITY): Payer: Self-pay

## 2022-10-27 DIAGNOSIS — H47233 Glaucomatous optic atrophy, bilateral: Secondary | ICD-10-CM | POA: Diagnosis not present

## 2022-10-27 DIAGNOSIS — H35342 Macular cyst, hole, or pseudohole, left eye: Secondary | ICD-10-CM | POA: Diagnosis not present

## 2022-10-27 DIAGNOSIS — H35033 Hypertensive retinopathy, bilateral: Secondary | ICD-10-CM | POA: Diagnosis not present

## 2022-10-27 DIAGNOSIS — H401132 Primary open-angle glaucoma, bilateral, moderate stage: Secondary | ICD-10-CM | POA: Diagnosis not present

## 2022-10-27 DIAGNOSIS — E113213 Type 2 diabetes mellitus with mild nonproliferative diabetic retinopathy with macular edema, bilateral: Secondary | ICD-10-CM | POA: Diagnosis not present

## 2022-10-27 MED ORDER — TIMOLOL MALEATE 0.5 % OP SOLN
1.0000 [drp] | Freq: Two times a day (BID) | OPHTHALMIC | 3 refills | Status: AC
  Filled 2022-10-27: qty 15, 90d supply, fill #0
  Filled 2023-05-24: qty 5, 30d supply, fill #1
  Filled 2023-09-23: qty 15, 90d supply, fill #1

## 2022-10-27 MED ORDER — BRIMONIDINE TARTRATE 0.1 % OP SOLN
1.0000 [drp] | Freq: Two times a day (BID) | OPHTHALMIC | 3 refills | Status: AC
  Filled 2022-10-27: qty 15, 90d supply, fill #0
  Filled 2023-05-24 – 2023-09-23 (×2): qty 15, 90d supply, fill #1

## 2022-10-27 MED ORDER — LATANOPROST 0.005 % OP SOLN
1.0000 [drp] | Freq: Every day | OPHTHALMIC | 4 refills | Status: AC
  Filled 2022-10-27: qty 7.5, 90d supply, fill #0
  Filled 2023-05-24 – 2023-09-23 (×2): qty 7.5, 75d supply, fill #1

## 2022-10-28 ENCOUNTER — Other Ambulatory Visit (HOSPITAL_COMMUNITY): Payer: Self-pay

## 2022-10-29 DIAGNOSIS — M170 Bilateral primary osteoarthritis of knee: Secondary | ICD-10-CM | POA: Diagnosis not present

## 2022-10-30 ENCOUNTER — Other Ambulatory Visit (HOSPITAL_COMMUNITY): Payer: Self-pay

## 2022-11-03 ENCOUNTER — Other Ambulatory Visit (HOSPITAL_COMMUNITY): Payer: Self-pay

## 2022-11-17 DIAGNOSIS — Z79899 Other long term (current) drug therapy: Secondary | ICD-10-CM | POA: Diagnosis not present

## 2022-11-17 DIAGNOSIS — E119 Type 2 diabetes mellitus without complications: Secondary | ICD-10-CM | POA: Diagnosis not present

## 2022-11-17 DIAGNOSIS — R7989 Other specified abnormal findings of blood chemistry: Secondary | ICD-10-CM | POA: Diagnosis not present

## 2022-11-17 DIAGNOSIS — E7800 Pure hypercholesterolemia, unspecified: Secondary | ICD-10-CM | POA: Diagnosis not present

## 2022-11-18 ENCOUNTER — Ambulatory Visit: Admission: RE | Admit: 2022-11-18 | Payer: PPO | Source: Ambulatory Visit

## 2022-11-18 ENCOUNTER — Ambulatory Visit
Admission: RE | Admit: 2022-11-18 | Discharge: 2022-11-18 | Disposition: A | Discharge status: Home / Self Care | Payer: PPO | Source: Ambulatory Visit | Attending: General Surgery | Admitting: General Surgery

## 2022-11-18 DIAGNOSIS — N6311 Unspecified lump in the right breast, upper outer quadrant: Secondary | ICD-10-CM | POA: Diagnosis not present

## 2022-11-18 DIAGNOSIS — N6081 Other benign mammary dysplasias of right breast: Secondary | ICD-10-CM

## 2022-11-24 DIAGNOSIS — R32 Unspecified urinary incontinence: Secondary | ICD-10-CM | POA: Diagnosis not present

## 2022-11-24 DIAGNOSIS — H409 Unspecified glaucoma: Secondary | ICD-10-CM | POA: Diagnosis not present

## 2022-11-24 DIAGNOSIS — I10 Essential (primary) hypertension: Secondary | ICD-10-CM | POA: Diagnosis not present

## 2022-11-24 DIAGNOSIS — I350 Nonrheumatic aortic (valve) stenosis: Secondary | ICD-10-CM | POA: Diagnosis not present

## 2022-11-24 DIAGNOSIS — M1990 Unspecified osteoarthritis, unspecified site: Secondary | ICD-10-CM | POA: Diagnosis not present

## 2022-11-24 DIAGNOSIS — E7800 Pure hypercholesterolemia, unspecified: Secondary | ICD-10-CM | POA: Diagnosis not present

## 2022-11-24 DIAGNOSIS — Z853 Personal history of malignant neoplasm of breast: Secondary | ICD-10-CM | POA: Diagnosis not present

## 2022-11-24 DIAGNOSIS — Z79899 Other long term (current) drug therapy: Secondary | ICD-10-CM | POA: Diagnosis not present

## 2022-11-24 DIAGNOSIS — E559 Vitamin D deficiency, unspecified: Secondary | ICD-10-CM | POA: Diagnosis not present

## 2022-11-24 DIAGNOSIS — E669 Obesity, unspecified: Secondary | ICD-10-CM | POA: Diagnosis not present

## 2022-11-24 DIAGNOSIS — R011 Cardiac murmur, unspecified: Secondary | ICD-10-CM | POA: Diagnosis not present

## 2022-11-24 DIAGNOSIS — E118 Type 2 diabetes mellitus with unspecified complications: Secondary | ICD-10-CM | POA: Diagnosis not present

## 2022-12-03 ENCOUNTER — Other Ambulatory Visit (HOSPITAL_COMMUNITY): Payer: Self-pay

## 2023-01-15 ENCOUNTER — Other Ambulatory Visit (HOSPITAL_COMMUNITY): Payer: Self-pay

## 2023-01-18 ENCOUNTER — Other Ambulatory Visit (HOSPITAL_COMMUNITY): Payer: Self-pay

## 2023-01-18 MED ORDER — MOUNJARO 5 MG/0.5ML ~~LOC~~ SOAJ
5.0000 mg | SUBCUTANEOUS | 0 refills | Status: DC
  Filled 2023-01-18: qty 2, 28d supply, fill #0

## 2023-01-18 MED ORDER — MOUNJARO 2.5 MG/0.5ML ~~LOC~~ SOAJ
2.5000 mg | SUBCUTANEOUS | 5 refills | Status: DC
  Filled 2023-01-18 – 2023-05-24 (×2): qty 2, 28d supply, fill #0

## 2023-01-28 DIAGNOSIS — M170 Bilateral primary osteoarthritis of knee: Secondary | ICD-10-CM | POA: Diagnosis not present

## 2023-02-17 ENCOUNTER — Other Ambulatory Visit (HOSPITAL_COMMUNITY): Payer: Self-pay

## 2023-02-18 ENCOUNTER — Other Ambulatory Visit (HOSPITAL_COMMUNITY): Payer: Self-pay

## 2023-02-18 MED ORDER — MOUNJARO 5 MG/0.5ML ~~LOC~~ SOAJ
5.0000 mg | SUBCUTANEOUS | 3 refills | Status: DC
  Filled 2023-02-18: qty 2, 28d supply, fill #0
  Filled 2023-03-17: qty 2, 28d supply, fill #1
  Filled 2023-04-26: qty 2, 28d supply, fill #2
  Filled 2023-05-24 – 2023-06-17 (×2): qty 2, 28d supply, fill #3

## 2023-03-17 ENCOUNTER — Other Ambulatory Visit (HOSPITAL_COMMUNITY): Payer: Self-pay

## 2023-03-19 ENCOUNTER — Other Ambulatory Visit (HOSPITAL_COMMUNITY): Payer: Self-pay

## 2023-03-23 ENCOUNTER — Other Ambulatory Visit (HOSPITAL_COMMUNITY): Payer: Self-pay

## 2023-03-24 DIAGNOSIS — H47233 Glaucomatous optic atrophy, bilateral: Secondary | ICD-10-CM | POA: Diagnosis not present

## 2023-03-24 DIAGNOSIS — H35033 Hypertensive retinopathy, bilateral: Secondary | ICD-10-CM | POA: Diagnosis not present

## 2023-03-24 DIAGNOSIS — H401132 Primary open-angle glaucoma, bilateral, moderate stage: Secondary | ICD-10-CM | POA: Diagnosis not present

## 2023-03-24 DIAGNOSIS — H35342 Macular cyst, hole, or pseudohole, left eye: Secondary | ICD-10-CM | POA: Diagnosis not present

## 2023-03-25 ENCOUNTER — Other Ambulatory Visit (HOSPITAL_COMMUNITY): Payer: Self-pay

## 2023-03-25 DIAGNOSIS — N3941 Urge incontinence: Secondary | ICD-10-CM | POA: Diagnosis not present

## 2023-03-25 DIAGNOSIS — R351 Nocturia: Secondary | ICD-10-CM | POA: Diagnosis not present

## 2023-03-25 MED ORDER — LOSARTAN POTASSIUM 25 MG PO TABS
25.0000 mg | ORAL_TABLET | Freq: Every day | ORAL | 3 refills | Status: DC
  Filled 2023-03-25 – 2024-03-10 (×2): qty 90, 90d supply, fill #0

## 2023-03-25 MED ORDER — LOSARTAN POTASSIUM 25 MG PO TABS
25.0000 mg | ORAL_TABLET | Freq: Every day | ORAL | 3 refills | Status: AC
  Filled 2023-03-25: qty 90, 90d supply, fill #0
  Filled 2023-06-17: qty 90, 90d supply, fill #1
  Filled 2023-09-23: qty 90, 90d supply, fill #2
  Filled 2023-12-16: qty 90, 90d supply, fill #3

## 2023-04-05 ENCOUNTER — Other Ambulatory Visit: Payer: Self-pay | Admitting: General Surgery

## 2023-04-05 DIAGNOSIS — R921 Mammographic calcification found on diagnostic imaging of breast: Secondary | ICD-10-CM

## 2023-04-26 ENCOUNTER — Other Ambulatory Visit (HOSPITAL_COMMUNITY): Payer: Self-pay

## 2023-04-27 ENCOUNTER — Other Ambulatory Visit (HOSPITAL_COMMUNITY): Payer: Self-pay

## 2023-04-27 MED ORDER — TRAMADOL HCL 50 MG PO TABS
100.0000 mg | ORAL_TABLET | Freq: Three times a day (TID) | ORAL | 0 refills | Status: DC | PRN
Start: 2023-04-27 — End: 2023-12-17
  Filled 2023-04-27: qty 540, 90d supply, fill #0

## 2023-04-29 DIAGNOSIS — M170 Bilateral primary osteoarthritis of knee: Secondary | ICD-10-CM | POA: Diagnosis not present

## 2023-05-04 DIAGNOSIS — E7800 Pure hypercholesterolemia, unspecified: Secondary | ICD-10-CM | POA: Diagnosis not present

## 2023-05-04 DIAGNOSIS — E118 Type 2 diabetes mellitus with unspecified complications: Secondary | ICD-10-CM | POA: Diagnosis not present

## 2023-05-04 DIAGNOSIS — I10 Essential (primary) hypertension: Secondary | ICD-10-CM | POA: Diagnosis not present

## 2023-05-04 DIAGNOSIS — Z79899 Other long term (current) drug therapy: Secondary | ICD-10-CM | POA: Diagnosis not present

## 2023-05-04 DIAGNOSIS — E559 Vitamin D deficiency, unspecified: Secondary | ICD-10-CM | POA: Diagnosis not present

## 2023-05-18 ENCOUNTER — Other Ambulatory Visit (HOSPITAL_COMMUNITY): Payer: Self-pay

## 2023-05-18 DIAGNOSIS — G629 Polyneuropathy, unspecified: Secondary | ICD-10-CM | POA: Diagnosis not present

## 2023-05-18 DIAGNOSIS — K219 Gastro-esophageal reflux disease without esophagitis: Secondary | ICD-10-CM | POA: Diagnosis not present

## 2023-05-18 DIAGNOSIS — J309 Allergic rhinitis, unspecified: Secondary | ICD-10-CM | POA: Diagnosis not present

## 2023-05-18 DIAGNOSIS — I10 Essential (primary) hypertension: Secondary | ICD-10-CM | POA: Diagnosis not present

## 2023-05-18 DIAGNOSIS — E785 Hyperlipidemia, unspecified: Secondary | ICD-10-CM | POA: Diagnosis not present

## 2023-05-18 DIAGNOSIS — Z0000 Encounter for general adult medical examination without abnormal findings: Secondary | ICD-10-CM | POA: Diagnosis not present

## 2023-05-18 DIAGNOSIS — E118 Type 2 diabetes mellitus with unspecified complications: Secondary | ICD-10-CM | POA: Diagnosis not present

## 2023-05-18 DIAGNOSIS — I350 Nonrheumatic aortic (valve) stenosis: Secondary | ICD-10-CM | POA: Diagnosis not present

## 2023-05-18 MED ORDER — FLUTICASONE PROPIONATE 50 MCG/ACT NA SUSP
2.0000 | Freq: Every day | NASAL | 11 refills | Status: AC
  Filled 2023-05-18: qty 16, 30d supply, fill #0

## 2023-05-24 ENCOUNTER — Other Ambulatory Visit (HOSPITAL_COMMUNITY): Payer: Self-pay

## 2023-05-25 ENCOUNTER — Other Ambulatory Visit (HOSPITAL_COMMUNITY): Payer: Self-pay

## 2023-06-03 ENCOUNTER — Other Ambulatory Visit: Payer: Self-pay | Admitting: General Surgery

## 2023-06-03 DIAGNOSIS — R921 Mammographic calcification found on diagnostic imaging of breast: Secondary | ICD-10-CM

## 2023-06-10 ENCOUNTER — Inpatient Hospital Stay
Admission: RE | Admit: 2023-06-10 | Discharge: 2023-06-10 | Discharge status: Home / Self Care | Payer: PPO | Source: Ambulatory Visit | Attending: General Surgery | Admitting: General Surgery

## 2023-06-10 DIAGNOSIS — R92323 Mammographic fibroglandular density, bilateral breasts: Secondary | ICD-10-CM | POA: Diagnosis not present

## 2023-06-10 DIAGNOSIS — R928 Other abnormal and inconclusive findings on diagnostic imaging of breast: Secondary | ICD-10-CM | POA: Diagnosis not present

## 2023-06-10 DIAGNOSIS — R921 Mammographic calcification found on diagnostic imaging of breast: Secondary | ICD-10-CM

## 2023-06-18 ENCOUNTER — Other Ambulatory Visit (HOSPITAL_COMMUNITY): Payer: Self-pay

## 2023-06-22 ENCOUNTER — Other Ambulatory Visit (HOSPITAL_COMMUNITY): Payer: Self-pay

## 2023-06-22 MED ORDER — FREESTYLE LIBRE 3 SENSOR MISC
4 refills | Status: AC
  Filled 2023-06-22 – 2023-06-25 (×2): qty 6, 84d supply, fill #0
  Filled 2023-09-23: qty 6, 84d supply, fill #1
  Filled 2023-12-16: qty 6, 84d supply, fill #2
  Filled 2024-03-10: qty 6, 84d supply, fill #3

## 2023-06-25 ENCOUNTER — Other Ambulatory Visit (HOSPITAL_COMMUNITY): Payer: Self-pay

## 2023-06-28 ENCOUNTER — Other Ambulatory Visit (HOSPITAL_COMMUNITY): Payer: Self-pay

## 2023-07-19 ENCOUNTER — Other Ambulatory Visit (HOSPITAL_COMMUNITY): Payer: Self-pay

## 2023-07-22 ENCOUNTER — Other Ambulatory Visit (HOSPITAL_COMMUNITY): Payer: Self-pay

## 2023-07-22 MED ORDER — MOUNJARO 5 MG/0.5ML ~~LOC~~ SOAJ
5.0000 mg | SUBCUTANEOUS | 3 refills | Status: DC
  Filled 2023-07-22 – 2023-07-28 (×2): qty 2, 28d supply, fill #0
  Filled 2023-09-01: qty 2, 28d supply, fill #1
  Filled 2023-09-23: qty 2, 28d supply, fill #2
  Filled 2023-11-08: qty 2, 28d supply, fill #3

## 2023-07-23 ENCOUNTER — Other Ambulatory Visit (HOSPITAL_COMMUNITY): Payer: Self-pay

## 2023-07-28 ENCOUNTER — Other Ambulatory Visit (HOSPITAL_COMMUNITY): Payer: Self-pay

## 2023-08-11 DIAGNOSIS — M170 Bilateral primary osteoarthritis of knee: Secondary | ICD-10-CM | POA: Diagnosis not present

## 2023-08-13 ENCOUNTER — Encounter: Payer: PPO | Admitting: Adult Health

## 2023-08-20 ENCOUNTER — Inpatient Hospital Stay: Payer: PPO | Attending: Adult Health | Admitting: Adult Health

## 2023-08-20 VITALS — BP 130/80 | HR 97 | Temp 98.2°F | Resp 18 | Wt 164.9 lb

## 2023-08-20 DIAGNOSIS — R011 Cardiac murmur, unspecified: Secondary | ICD-10-CM | POA: Insufficient documentation

## 2023-08-20 DIAGNOSIS — Z170 Estrogen receptor positive status [ER+]: Secondary | ICD-10-CM | POA: Diagnosis not present

## 2023-08-20 DIAGNOSIS — Z923 Personal history of irradiation: Secondary | ICD-10-CM | POA: Insufficient documentation

## 2023-08-20 DIAGNOSIS — C50211 Malignant neoplasm of upper-inner quadrant of right female breast: Secondary | ICD-10-CM | POA: Diagnosis not present

## 2023-08-20 DIAGNOSIS — Z90710 Acquired absence of both cervix and uterus: Secondary | ICD-10-CM | POA: Diagnosis not present

## 2023-08-20 DIAGNOSIS — Z9221 Personal history of antineoplastic chemotherapy: Secondary | ICD-10-CM | POA: Diagnosis not present

## 2023-08-20 DIAGNOSIS — R040 Epistaxis: Secondary | ICD-10-CM | POA: Diagnosis not present

## 2023-08-20 DIAGNOSIS — R634 Abnormal weight loss: Secondary | ICD-10-CM | POA: Insufficient documentation

## 2023-08-20 DIAGNOSIS — Z853 Personal history of malignant neoplasm of breast: Secondary | ICD-10-CM | POA: Diagnosis not present

## 2023-08-20 NOTE — Progress Notes (Signed)
 [facility] Cancer Follow up:    Shelley Reichmann, DO [address] [place] [address]   DIAGNOSIS:  Cancer Staging  Breast cancer of upper-inner quadrant of right female breast [facility]) Staging form: Breast, AJCC 7th Edition - Clinical stage from 10/23/2015: Stage IA (T1c, N0, M0) - Unsigned Staged by: Pathologist and managing physician Laterality: Right Estrogen receptor status: Positive Progesterone receptor status: Negative HER2 status: Negative Stage used in treatment planning: Yes National guidelines used in treatment planning: Yes Type of national guideline used in treatment planning: NCCN - Pathologic: Stage IA (T1c, N0, cM0) - Unsigned   SUMMARY OF ONCOLOGIC HISTORY: Oncology History  Breast cancer of upper-inner quadrant of right female breast (HCC)  10/14/2015 Initial Diagnosis   Screening detected Right breast mass at 1:00:1.1 x 0.8 x 0.7 cm,grade 2 IDC plus DCIS, ER 100 present, PR 0%, HER-2 negative ratio 1.64, Ki-67 60%, T1cN0 stage IA   10/30/2015 Surgery   Right lumpectomy: IDC grade 2, 1.4 cm, IG DCIS, margins negative, 0/2 lymph nodes negative, ER 100%, PR 0%, HER-2 negative, Ki-67 60 %, T1 cN0 stage IA, Oncotype DX score 30, 20% risk of recurrence   11/25/2015 - 01/27/2016 Chemotherapy   Adjuvant chemotherapy with Taxotere and Cytoxan every 3 weeks 4 cycles   03/16/2016 - 04/29/2016 Radiation Therapy   Adj XRT   06/09/2016 - 09/2016 Anti-estrogen oral therapy   Anastrozole 1 mg by mouth daily--unable to tolerate this or any other antiestrogen therapy     CURRENT THERAPY: observation  INTERVAL HISTORY:  Discussed the use of AI scribe software for clinical note transcription with the patient, who gave verbal consent to proceed.  Shelley Lawrence 83 y.o. female with a history of breast cancer, arthritis, and a heart murmur, presents for a routine check-up. She reports significant weight loss, which has resulted in less pain in her  knees, previously affected by arthritis. However, she also notes dissatisfaction with the aesthetic changes in her body due to the weight loss. She has been maintaining regular follow-ups with her primary care provider and eye doctor. She also mentions a cardiac evaluation done about a year ago. She has been adhering to biannual mammograms for breast cancer surveillance. The patient does not report any new or concerning symptoms related to her breasts. She mentions a past experience with chemotherapy, during which she experienced significant nosebleeds. She also reports a history of menopause at the age of 52 due to a total hysterectomy.   Patient Active Problem List   Diagnosis Date Noted   Abdominal aortic atherosclerosis (HCC) 10/14/2021   Lumbar radiculopathy 10/14/2021   Hyperlipidemia 10/14/2021   Breast cancer of upper-inner quadrant of right female breast (HCC) 10/16/2015   Onychomycosis 11/22/2012   Pain in joint, ankle and foot 11/22/2012   Hammer toe 11/22/2012   DIABETES MELLITUS-TYPE II 07/04/2009   ANEMIA-IRON DEFICIENCY 07/04/2009   ESOPHAGEAL STRICTURE 07/04/2009   GERD 07/04/2009   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    is allergic to horse-derived products, linagliptin, lisinopril, and saxagliptin-metformin er.  MEDICAL HISTORY: Past Medical History:  Diagnosis Date   Allergy    Anemia    Arthritis    knees   Breast cancer (HCC) 10/14/2015   Right Breast   Broken foot    Carpal tunnel syndrome    Diabetes mellitus without complication (HCC)    GERD (gastroesophageal reflux disease)    History of radiation therapy 03/16/16-04/29/16   Right breast/ 50.4 Gy  in 28 fractions and right breast boost / 12 Gy in 6 fractions.   Hot flashes    Hypertension    Personal history of chemotherapy    Personal history of radiation therapy    Port catheter in place 01/06/2016    SURGICAL HISTORY: Past Surgical History:  Procedure Laterality Date    ABDOMINAL HYSTERECTOMY     BACK SURGERY     BREAST BIOPSY Right 10/14/2015   U/S Core- Malignant   BREAST BIOPSY Right 05/05/2022   MM RT BREAST BX W LOC DEV 1ST LESION IMAGE BX SPEC STEREO GUIDE 05/05/2022 [facility]   BREAST BIOPSY Right 05/05/2022   MM RT BREAST BX W LOC DEV EA AD LESION IMG BX SPEC STEREO GUIDE 05/05/2022 [facility]   BREAST LUMPECTOMY Right 10/30/2015   BREAST LUMPECTOMY WITH NEEDLE LOCALIZATION AND AXILLARY SENTINEL LYMPH NODE BX Right 10/30/2015   Procedure: BREAST LUMPECTOMY WITH NEEDLE LOCALIZATION AND AXILLARY SENTINEL LYMPH NODE BX;  Surgeon: Chevis Pretty III, MD;  Location: [facility];  Service: General;  Laterality: Right;   CARPAL TUNNEL RELEASE Right    KNEE SURGERY     OVARY SURGERY     removal of ovarian tumor   PORT-A-CATH REMOVAL Left 10/18/2017   Procedure: REMOVAL PORT-A-CATH;  Surgeon: Griselda Miner, MD;  Location: [facility];  Service: General;  Laterality: Left;   PORTACATH PLACEMENT Left 11/21/2015   Procedure: INSERTION PORT-A-CATH;  Surgeon: Chevis Pretty III, MD;  Location: [facility];  Service: General;  Laterality: Left;   TONSILECTOMY/ADENOIDECTOMY WITH MYRINGOTOMY     TUBAL LIGATION      SOCIAL HISTORY: Social History   Socioeconomic History   Marital status: Widowed    Spouse name: Not on file   Number of children: Not on file   Years of education: Not on file   Highest education level: Not on file  Occupational History   Occupation: office worker  Tobacco Use   Smoking status: Never   Smokeless tobacco: Never  Substance and Sexual Activity   Alcohol use: No   Drug use: No   Sexual activity: Not on file  Other Topics Concern   Not on file  Social History Narrative   Not on file   Social Drivers of Health   Financial Resource Strain: Not on file  Food Insecurity: Not on file  Transportation Needs: Not on file  Physical Activity: Not on file  Stress: Not on file   Social Connections: Not on file  Intimate Partner Violence: Not on file    FAMILY HISTORY: No family history on file.  Review of Systems  Constitutional:  Negative for appetite change, chills, fatigue, fever and unexpected weight change.  HENT:   Negative for hearing loss, lump/mass and trouble swallowing.   Eyes:  Negative for eye problems and icterus.  Respiratory:  Negative for chest tightness, cough and shortness of breath.   Cardiovascular:  Negative for chest pain, leg swelling and palpitations.  Gastrointestinal:  Negative for abdominal distention, abdominal pain, constipation, diarrhea, nausea and vomiting.  Endocrine: Negative for hot flashes.  Genitourinary:  Negative for difficulty urinating.   Musculoskeletal:  Negative for arthralgias.  Skin:  Negative for itching and rash.  Neurological:  Negative for dizziness, extremity weakness, headaches and numbness.  Hematological:  Negative for adenopathy. Does not bruise/bleed easily.  Psychiatric/Behavioral:  Negative for depression. The patient is not nervous/anxious.       PHYSICAL EXAMINATION    Vitals:  08/20/23 1041 08/20/23 1133  BP: (!) 172/62 130/80  Pulse: 97   Resp: 18   Temp: 98.2 F (36.8 C)   SpO2: 96%     Physical Exam Constitutional:      General: She is not in acute distress.    Appearance: Normal appearance. She is not toxic-appearing.  HENT:     Head: Normocephalic and atraumatic.     Mouth/Throat:     Mouth: Mucous membranes are moist.     Pharynx: Oropharynx is clear. No oropharyngeal exudate or posterior oropharyngeal erythema.  Eyes:     General: No scleral icterus. Cardiovascular:     Rate and Rhythm: Normal rate and regular rhythm.     Pulses: Normal pulses.     Heart sounds: Murmur heard.  Pulmonary:     Effort: Pulmonary effort is normal.     Breath sounds: Normal breath sounds.  Chest:     Comments: Right breast status postlumpectomy and radiation no sign of local recurrence  left breast is benign. Abdominal:     General: Abdomen is flat. Bowel sounds are normal. There is no distension.     Palpations: Abdomen is soft.     Tenderness: There is no abdominal tenderness.  Musculoskeletal:        General: No swelling.     Cervical back: Neck supple.  Lymphadenopathy:     Cervical: No cervical adenopathy.     Upper Body:     Right upper body: No supraclavicular or axillary adenopathy.     Left upper body: No supraclavicular or axillary adenopathy.  Skin:    General: Skin is warm and dry.     Findings: No rash.  Neurological:     General: No focal deficit present.     Mental Status: She is alert.  Psychiatric:        Mood and Affect: Mood normal.        Behavior: Behavior normal.    ASSESSMENT and THERAPY PLAN:   Breast cancer of upper-inner quadrant of right female breast (HCC)  Shelley Lawrence is an 83 year old woman with history of stage Ia ER positive breast cancer diagnosed in 2017 status postlumpectomy followed by adjuvant radiation and adjuvant antiestrogen therapy with anastrozole which she took for 3 months and discontinued due to difficulty tolerating.  Breast cancer No recurrence. Weight loss post-menopause beneficial for reducing estrogen production and recurrence risk. - Continue annual mammograms. - Return for evaluation if new breast changes or concerns arise.  Weight loss Weight loss alleviated joint pain and reduced estrogen production, beneficial for health and reducing breast cancer recurrence risk. - Encourage continued weight management.  Epistaxis Severe epistaxis linked to previous cancer treatment medication. Medication discontinued due to adverse effects.  Heart murmur Diagnosed in her seventies, monitored periodically. No acute concerns.  Follow-up Advised to continue regular health screenings. - Schedule next annual visit or sooner if new symptoms arise.    All questions were answered. The patient knows to call the clinic  with any problems, questions or concerns. We can certainly see the patient much sooner if necessary.  Total encounter time:20 minutes*in face-to-face visit time, chart review, lab review, care coordination, order entry, and documentation of the encounter time.    Lillard Anes, NP 08/23/23 8:21 AM Medical Oncology and Hematology [facility] [address] [place], [place] [address] Tel. [phone]    Fax. [phone]  *Total Encounter Time as defined by the Centers for Medicare and Medicaid Services includes, in addition to  the face-to-face time of a patient visit (documented in the note above) non-face-to-face time: obtaining and reviewing outside history, ordering and reviewing medications, tests or procedures, care coordination (communications with other health care professionals or caregivers) and documentation in the medical record.

## 2023-08-23 ENCOUNTER — Encounter: Payer: Self-pay | Admitting: Adult Health

## 2023-08-23 NOTE — Assessment & Plan Note (Signed)
  Shelley Lawrence is an 83 year old woman with history of stage Ia ER positive breast cancer diagnosed in 2017 status postlumpectomy followed by adjuvant radiation and adjuvant antiestrogen therapy with anastrozole which she took for 3 months and discontinued due to difficulty tolerating.  Breast cancer No recurrence. Weight loss post-menopause beneficial for reducing estrogen production and recurrence risk. - Continue annual mammograms. - Return for evaluation if new breast changes or concerns arise.  Weight loss Weight loss alleviated joint pain and reduced estrogen production, beneficial for health and reducing breast cancer recurrence risk. - Encourage continued weight management.  Epistaxis Severe epistaxis linked to previous cancer treatment medication. Medication discontinued due to adverse effects.  Heart murmur Diagnosed in her seventies, monitored periodically. No acute concerns.  Follow-up Advised to continue regular health screenings. - Schedule next annual visit or sooner if new symptoms arise.

## 2023-09-01 ENCOUNTER — Other Ambulatory Visit (HOSPITAL_COMMUNITY): Payer: Self-pay

## 2023-09-01 MED ORDER — ATORVASTATIN CALCIUM 40 MG PO TABS
40.0000 mg | ORAL_TABLET | Freq: Every day | ORAL | 3 refills | Status: AC
  Filled 2023-09-01: qty 90, 90d supply, fill #0
  Filled 2023-12-16: qty 90, 90d supply, fill #1
  Filled 2024-03-10: qty 90, 90d supply, fill #2
  Filled 2024-07-10: qty 90, 90d supply, fill #3

## 2023-09-03 ENCOUNTER — Other Ambulatory Visit (HOSPITAL_COMMUNITY): Payer: Self-pay

## 2023-09-21 DIAGNOSIS — H401132 Primary open-angle glaucoma, bilateral, moderate stage: Secondary | ICD-10-CM | POA: Diagnosis not present

## 2023-09-21 DIAGNOSIS — H35033 Hypertensive retinopathy, bilateral: Secondary | ICD-10-CM | POA: Diagnosis not present

## 2023-09-21 DIAGNOSIS — H35342 Macular cyst, hole, or pseudohole, left eye: Secondary | ICD-10-CM | POA: Diagnosis not present

## 2023-09-21 DIAGNOSIS — H47233 Glaucomatous optic atrophy, bilateral: Secondary | ICD-10-CM | POA: Diagnosis not present

## 2023-09-22 DIAGNOSIS — R351 Nocturia: Secondary | ICD-10-CM | POA: Diagnosis not present

## 2023-09-22 DIAGNOSIS — N3941 Urge incontinence: Secondary | ICD-10-CM | POA: Diagnosis not present

## 2023-09-23 ENCOUNTER — Other Ambulatory Visit (HOSPITAL_COMMUNITY): Payer: Self-pay

## 2023-09-24 ENCOUNTER — Other Ambulatory Visit (HOSPITAL_COMMUNITY): Payer: Self-pay

## 2023-10-04 ENCOUNTER — Other Ambulatory Visit (HOSPITAL_COMMUNITY): Payer: Self-pay

## 2023-10-04 MED ORDER — LATANOPROST 0.005 % OP SOLN
1.0000 [drp] | Freq: Every day | OPHTHALMIC | 3 refills | Status: AC
Start: 2023-10-04 — End: ?

## 2023-10-04 MED ORDER — TIMOLOL MALEATE 0.5 % OP SOLN
1.0000 [drp] | Freq: Two times a day (BID) | OPHTHALMIC | 3 refills | Status: AC
Start: 2023-10-04 — End: ?

## 2023-10-04 MED ORDER — BRIMONIDINE TARTRATE 0.1 % OP SOLN
1.0000 [drp] | Freq: Two times a day (BID) | OPHTHALMIC | 3 refills | Status: AC
Start: 2023-10-04 — End: ?
  Filled 2023-10-04: qty 5, 50d supply, fill #0

## 2023-10-05 ENCOUNTER — Other Ambulatory Visit (HOSPITAL_COMMUNITY): Payer: Self-pay

## 2023-11-08 ENCOUNTER — Other Ambulatory Visit (HOSPITAL_COMMUNITY): Payer: Self-pay

## 2023-11-10 DIAGNOSIS — E785 Hyperlipidemia, unspecified: Secondary | ICD-10-CM | POA: Diagnosis not present

## 2023-11-10 DIAGNOSIS — I10 Essential (primary) hypertension: Secondary | ICD-10-CM | POA: Diagnosis not present

## 2023-11-10 DIAGNOSIS — E118 Type 2 diabetes mellitus with unspecified complications: Secondary | ICD-10-CM | POA: Diagnosis not present

## 2023-11-15 ENCOUNTER — Other Ambulatory Visit (HOSPITAL_COMMUNITY): Payer: Self-pay

## 2023-11-15 DIAGNOSIS — M79644 Pain in right finger(s): Secondary | ICD-10-CM | POA: Diagnosis not present

## 2023-11-15 DIAGNOSIS — M25561 Pain in right knee: Secondary | ICD-10-CM | POA: Diagnosis not present

## 2023-11-15 DIAGNOSIS — M25562 Pain in left knee: Secondary | ICD-10-CM | POA: Diagnosis not present

## 2023-11-15 DIAGNOSIS — G8929 Other chronic pain: Secondary | ICD-10-CM | POA: Diagnosis not present

## 2023-11-15 MED ORDER — DOXYCYCLINE HYCLATE 100 MG PO CAPS
100.0000 mg | ORAL_CAPSULE | Freq: Two times a day (BID) | ORAL | 2 refills | Status: AC
  Filled 2023-11-15: qty 10, 5d supply, fill #0

## 2023-11-16 ENCOUNTER — Other Ambulatory Visit (HOSPITAL_COMMUNITY): Payer: Self-pay

## 2023-11-17 ENCOUNTER — Other Ambulatory Visit (HOSPITAL_COMMUNITY): Payer: Self-pay

## 2023-11-17 DIAGNOSIS — E118 Type 2 diabetes mellitus with unspecified complications: Secondary | ICD-10-CM | POA: Diagnosis not present

## 2023-11-17 DIAGNOSIS — I350 Nonrheumatic aortic (valve) stenosis: Secondary | ICD-10-CM | POA: Diagnosis not present

## 2023-11-17 DIAGNOSIS — R011 Cardiac murmur, unspecified: Secondary | ICD-10-CM | POA: Diagnosis not present

## 2023-11-17 DIAGNOSIS — E559 Vitamin D deficiency, unspecified: Secondary | ICD-10-CM | POA: Diagnosis not present

## 2023-11-17 DIAGNOSIS — I10 Essential (primary) hypertension: Secondary | ICD-10-CM | POA: Diagnosis not present

## 2023-11-17 DIAGNOSIS — E785 Hyperlipidemia, unspecified: Secondary | ICD-10-CM | POA: Diagnosis not present

## 2023-11-17 DIAGNOSIS — Z9109 Other allergy status, other than to drugs and biological substances: Secondary | ICD-10-CM | POA: Diagnosis not present

## 2023-11-17 DIAGNOSIS — J3489 Other specified disorders of nose and nasal sinuses: Secondary | ICD-10-CM | POA: Diagnosis not present

## 2023-11-17 DIAGNOSIS — R946 Abnormal results of thyroid function studies: Secondary | ICD-10-CM | POA: Diagnosis not present

## 2023-11-17 MED ORDER — AZELASTINE HCL 137 MCG/SPRAY NA SOLN
1.0000 | Freq: Two times a day (BID) | NASAL | 5 refills | Status: AC | PRN
  Filled 2023-11-17: qty 30, 30d supply, fill #0
  Filled 2024-03-10: qty 30, 30d supply, fill #1

## 2023-11-17 MED ORDER — MUPIROCIN CALCIUM 2 % EX CREA
1.0000 | TOPICAL_CREAM | Freq: Two times a day (BID) | CUTANEOUS | 5 refills | Status: AC
  Filled 2023-11-17: qty 15, 8d supply, fill #0

## 2023-11-17 MED ORDER — CETIRIZINE HCL 10 MG PO TABS
10.0000 mg | ORAL_TABLET | Freq: Every evening | ORAL | 5 refills | Status: AC | PRN
  Filled 2023-11-17: qty 30, 30d supply, fill #0
  Filled 2023-12-16: qty 30, 30d supply, fill #1
  Filled 2024-02-04: qty 30, 30d supply, fill #2
  Filled 2024-03-10: qty 30, 30d supply, fill #3

## 2023-12-06 ENCOUNTER — Other Ambulatory Visit (HOSPITAL_COMMUNITY): Payer: Self-pay

## 2023-12-06 MED ORDER — MOUNJARO 5 MG/0.5ML ~~LOC~~ SOAJ
5.0000 mg | SUBCUTANEOUS | 3 refills | Status: DC
  Filled 2023-12-06: qty 2, 28d supply, fill #0
  Filled 2024-01-06: qty 2, 28d supply, fill #1
  Filled 2024-02-03: qty 2, 28d supply, fill #2
  Filled 2024-03-02: qty 2, 28d supply, fill #3

## 2023-12-16 ENCOUNTER — Other Ambulatory Visit (HOSPITAL_COMMUNITY): Payer: Self-pay

## 2023-12-17 ENCOUNTER — Other Ambulatory Visit (HOSPITAL_COMMUNITY): Payer: Self-pay

## 2023-12-17 ENCOUNTER — Other Ambulatory Visit: Payer: Self-pay

## 2023-12-17 MED ORDER — TRAMADOL HCL 50 MG PO TABS
100.0000 mg | ORAL_TABLET | Freq: Three times a day (TID) | ORAL | 1 refills | Status: AC | PRN
  Filled 2023-12-17: qty 540, 90d supply, fill #0

## 2024-01-06 ENCOUNTER — Other Ambulatory Visit (HOSPITAL_COMMUNITY): Payer: Self-pay

## 2024-01-13 ENCOUNTER — Other Ambulatory Visit (HOSPITAL_COMMUNITY): Payer: Self-pay

## 2024-01-19 DIAGNOSIS — H47233 Glaucomatous optic atrophy, bilateral: Secondary | ICD-10-CM | POA: Diagnosis not present

## 2024-01-19 DIAGNOSIS — H401132 Primary open-angle glaucoma, bilateral, moderate stage: Secondary | ICD-10-CM | POA: Diagnosis not present

## 2024-01-19 DIAGNOSIS — H35033 Hypertensive retinopathy, bilateral: Secondary | ICD-10-CM | POA: Diagnosis not present

## 2024-01-19 DIAGNOSIS — H35342 Macular cyst, hole, or pseudohole, left eye: Secondary | ICD-10-CM | POA: Diagnosis not present

## 2024-01-26 ENCOUNTER — Other Ambulatory Visit: Payer: Self-pay

## 2024-02-04 ENCOUNTER — Other Ambulatory Visit: Payer: Self-pay

## 2024-02-04 ENCOUNTER — Other Ambulatory Visit (HOSPITAL_COMMUNITY): Payer: Self-pay

## 2024-02-09 ENCOUNTER — Encounter: Payer: Self-pay | Admitting: Podiatry

## 2024-02-09 ENCOUNTER — Ambulatory Visit (INDEPENDENT_AMBULATORY_CARE_PROVIDER_SITE_OTHER): Admitting: Podiatry

## 2024-02-09 DIAGNOSIS — M79675 Pain in left toe(s): Secondary | ICD-10-CM

## 2024-02-09 DIAGNOSIS — B351 Tinea unguium: Secondary | ICD-10-CM | POA: Diagnosis not present

## 2024-02-09 DIAGNOSIS — E1142 Type 2 diabetes mellitus with diabetic polyneuropathy: Secondary | ICD-10-CM | POA: Diagnosis not present

## 2024-02-09 DIAGNOSIS — M79674 Pain in right toe(s): Secondary | ICD-10-CM | POA: Diagnosis not present

## 2024-02-09 NOTE — Progress Notes (Signed)
  Subjective:  Patient ID: Shelley Lawrence, female    DOB: [birth year],   MRN: [id number]  Chief Complaint  Patient presents with   Diabetes    I have the worst toenails.  We need to cut them off.  One is definitely dead.  My index toe digs into the big toe.  I need to correct that.  Saw Dr. Lonell Collet - 11/17/2023; A1c - 5.3    83 y.o. female presents for concern of thickened elongated and painful nails that are difficult to trim. Requesting to have them trimmed today Denies burning and tingling in their feet. Patient is diabetic and last A1c was No results found for: HGBA1C .   PCP:  Collet Lonell, DO    . Denies any other pedal complaints. Denies n/v/f/c.   Past Medical History:  Diagnosis Date   Allergy    Anemia    Arthritis    knees   Breast cancer (HCC) 10/14/2015   Right Breast   Broken foot    Carpal tunnel syndrome    Diabetes mellitus without complication (HCC)    GERD (gastroesophageal reflux disease)    History of radiation therapy 03/16/16-04/29/16   Right breast/ 50.4 Gy in 28 fractions and right breast boost / 12 Gy in 6 fractions.   Hot flashes    Hypertension    Personal history of chemotherapy    Personal history of radiation therapy    Port catheter in place 01/06/2016    Objective:  Physical Exam: Vascular: DP/PT pulses 2/4 bilateral. CFT <3 seconds. Absent hair growth on digits. Edema noted to bilateral lower extremities. Xerosis noted bilaterally.  Skin. No lacerations or abrasions bilateral feet. Nails 1-5 bilateral  are thickened discolored and elongated with subungual debris.  Musculoskeletal: MMT 5/5 bilateral lower extremities in DF, PF, Inversion and Eversion. Deceased ROM in DF of ankle joint. HAV and hammertoe deformity noted bilateral worse on right.  Neurological: Sensation intact to light touch. Protective sensation diminished bilateral.    Assessment:   1. Pain due to onychomycosis of toenails of both feet   2. Type 2  diabetes mellitus with peripheral neuropathy (HCC)      Plan:  Patient was evaluated and treated and all questions answered. -Discussed and educated patient on diabetic foot care, especially with  regards to the vascular, neurological and musculoskeletal systems.  -Stressed the importance of good glycemic control and the detriment of not  controlling glucose levels in relation to the foot. -Discussed supportive shoes at all times and checking feet regularly.  -Mechanically debrided all nails 1-5 bilateral using sterile nail nipper and filed with dremel without incident  -Answered all patient questions -Patient to return  in 3 months for at risk foot care -Patient advised to call the office if any problems or questions arise in the meantime.   Asberry Failing, DPM

## 2024-02-17 DIAGNOSIS — M170 Bilateral primary osteoarthritis of knee: Secondary | ICD-10-CM | POA: Diagnosis not present

## 2024-03-03 ENCOUNTER — Other Ambulatory Visit (HOSPITAL_COMMUNITY): Payer: Self-pay

## 2024-03-10 ENCOUNTER — Other Ambulatory Visit (HOSPITAL_COMMUNITY): Payer: Self-pay

## 2024-03-11 ENCOUNTER — Other Ambulatory Visit (HOSPITAL_COMMUNITY): Payer: Self-pay

## 2024-03-13 ENCOUNTER — Other Ambulatory Visit: Payer: Self-pay

## 2024-03-13 ENCOUNTER — Other Ambulatory Visit (HOSPITAL_COMMUNITY): Payer: Self-pay

## 2024-03-17 ENCOUNTER — Ambulatory Visit (INDEPENDENT_AMBULATORY_CARE_PROVIDER_SITE_OTHER)

## 2024-03-17 ENCOUNTER — Ambulatory Visit
Admission: EM | Admit: 2024-03-17 | Discharge: 2024-03-17 | Disposition: A | Discharge status: Home / Self Care | Attending: Physician Assistant | Admitting: Physician Assistant

## 2024-03-17 ENCOUNTER — Other Ambulatory Visit (HOSPITAL_COMMUNITY): Payer: Self-pay

## 2024-03-17 ENCOUNTER — Encounter: Payer: Self-pay | Admitting: Physician Assistant

## 2024-03-17 DIAGNOSIS — M25511 Pain in right shoulder: Secondary | ICD-10-CM | POA: Diagnosis not present

## 2024-03-17 DIAGNOSIS — M7581 Other shoulder lesions, right shoulder: Secondary | ICD-10-CM | POA: Diagnosis not present

## 2024-03-17 MED ORDER — LIDOCAINE 5 % EX PTCH
1.0000 | MEDICATED_PATCH | CUTANEOUS | 0 refills | Status: AC
  Filled 2024-03-17: qty 10, 10d supply, fill #0

## 2024-03-17 NOTE — ED Triage Notes (Signed)
 Pt c/o RUE pain from shoulder down-states pain started pulling self up stairs at church on 9/5-pain has cont'd to increase over the week-taking ibuprofen /last dose ~10am-NAD-slow gait with own cane

## 2024-03-17 NOTE — Discharge Instructions (Signed)
 Your x-ray showed some calcium  around your shoulder joint but nothing was broken or out of place.  I am concerned about your rotator cuff and like you to follow-up with orthopedics.  Please call them to schedule an appointment soon as possible.  Continue Tylenol  over-the-counter and you can also use lidocaine  patch.  Apply 1 patch during morning and wear all day (12 hours) and then remove it at night.  Use only 1 patch for 24 hours.  If you have any worsening or changing symptoms please return for reevaluation.

## 2024-03-17 NOTE — ED Provider Notes (Signed)
 [facility]    CSN: [id number] Arrival date & time: 03/17/24  1125      History   Chief Complaint Chief Complaint  Patient presents with   Arm Pain    HPI Shelley Lawrence is a 83 y.o. female.   Patient presents today with a month-long history of right shoulder and upper arm pain.  She denies any recent fall or trauma.  Does report that just before her symptoms began she had an episode where she was at church and went downstairs and then had to use her arms to help her go back up the stairs.  She did not have an immediate pain but the pain began soon after this episode.  She reports that the pain has been worsening and is currently rated 8/9 on a 0-10 pain scale, localized to anterior right shoulder with radiation into upper arm, described as sharp, worse with abduction/overhead flexion/internal/external rotation, no alleviating factors notified.  She has tried heating pad as well as tramadol , ibuprofen  without improvement.  Denies any previous injury or surgery involving her shoulder.  She is having difficulty with her daily activities as even moving her arm to get dressed is uncomfortable.  She denies any numbness or paresthesias in her hand.  She is right-handed.    Past Medical History:  Diagnosis Date   Allergy    Anemia    Arthritis    knees   Breast cancer (HCC) 10/14/2015   Right Breast   Broken foot    Carpal tunnel syndrome    Diabetes mellitus without complication (HCC)    GERD (gastroesophageal reflux disease)    History of radiation therapy 03/16/16-04/29/16   Right breast/ 50.4 Gy in 28 fractions and right breast boost / 12 Gy in 6 fractions.   Hot flashes    Hypertension    Personal history of chemotherapy    Personal history of radiation therapy    Port catheter in place 01/06/2016    Patient Active Problem List   Diagnosis Date Noted   Abdominal aortic atherosclerosis 10/14/2021   Lumbar radiculopathy 10/14/2021   Hyperlipidemia  10/14/2021   Breast cancer of upper-inner quadrant of right female breast (HCC) 10/16/2015   Onychomycosis 11/22/2012   Pain in joint, ankle and foot 11/22/2012   Hammer toe 11/22/2012   DIABETES MELLITUS-TYPE II 07/04/2009   ANEMIA-IRON DEFICIENCY 07/04/2009   ESOPHAGEAL STRICTURE 07/04/2009   GERD 07/04/2009   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    Past Surgical History:  Procedure Laterality Date   ABDOMINAL HYSTERECTOMY     BACK SURGERY     BREAST BIOPSY Right 10/14/2015   U/S Core- Malignant   BREAST BIOPSY Right 05/05/2022   MM RT BREAST BX W LOC DEV 1ST LESION IMAGE BX SPEC STEREO GUIDE 05/05/2022 [facility]   BREAST BIOPSY Right 05/05/2022   MM RT BREAST BX W LOC DEV EA AD LESION IMG BX SPEC STEREO GUIDE 05/05/2022 [facility]   BREAST LUMPECTOMY Right 10/30/2015   BREAST LUMPECTOMY WITH NEEDLE LOCALIZATION AND AXILLARY SENTINEL LYMPH NODE BX Right 10/30/2015   Procedure: BREAST LUMPECTOMY WITH NEEDLE LOCALIZATION AND AXILLARY SENTINEL LYMPH NODE BX;  Surgeon: Deward Null III, MD;  Location: [facility];  Service: General;  Laterality: Right;   CARPAL TUNNEL RELEASE Right    KNEE SURGERY     OVARY SURGERY     removal of ovarian tumor   PORT-A-CATH REMOVAL Left 10/18/2017   Procedure: REMOVAL PORT-A-CATH;  Surgeon:  Curvin Deward MOULD, MD;  Location: [facility];  Service: General;  Laterality: Left;   PORTACATH PLACEMENT Left 11/21/2015   Procedure: INSERTION PORT-A-CATH;  Surgeon: Deward Curvin III, MD;  Location: [facility];  Service: General;  Laterality: Left;   TONSILECTOMY/ADENOIDECTOMY WITH MYRINGOTOMY     TUBAL LIGATION      OB History   No obstetric history on file.      Home Medications    Prior to Admission medications   Medication Sig Start Date End Date Taking? Authorizing Provider  lidocaine  (LIDODERM ) 5 % Place 1 patch onto the skin daily. Remove & Discard patch within 12 hours or as  directed by MD 03/17/24  Yes Udell Mazzocco K, PA-C  aspirin  81 MG tablet Take 81 mg by mouth daily.    [provider]  atorvastatin  (LIPITOR) 40 MG tablet Take 40 mg by mouth daily.    [provider]  atorvastatin  (LIPITOR) 40 MG tablet Take 1 tablet (40 mg total) by mouth daily. 09/01/23     Azelastine  HCl 137 MCG/SPRAY SOLN Place 1 spray into both nostrils 2 (two) times daily as needed for nasal congestion. 11/17/23     b complex vitamins capsule Take 1 capsule by mouth daily.    [provider]  brimonidine  (ALPHAGAN  P) 0.1 % SOLN Place 1 drop into the left eye 2 (two) times daily. 10/27/22     brimonidine  (ALPHAGAN  P) 0.1 % SOLN Place 1 drop into the left eye 2 (two) times daily. 10/04/23     brimonidine -timolol  (COMBIGAN) 0.2-0.5 % ophthalmic solution Place 1 drop into the left eye every 12 (twelve) hours.    [provider]  cetirizine  (ZYRTEC ) 10 MG tablet Take 1 tablet (10 mg total) by mouth at bedtime as needed for allergies/congestion. 11/17/23     Continuous Blood Gluc Receiver (FREESTYLE LIBRE 3 READER) DEVI Use as directed 06/18/22     Continuous Glucose Sensor (FREESTYLE LIBRE 3 SENSOR) MISC Apply 1 sensor every 14 days. 06/22/23     doxycycline  (VIBRAMYCIN ) 100 MG capsule Take 1 capsule (100 mg total) by mouth 2 (two) times daily for 5 days. Patient not taking: Reported on 02/09/2024 11/15/23     esomeprazole  (NEXIUM ) 40 MG capsule Take 1 capsule (40 mg total) by mouth daily before breakfast. 10/28/12   Abran Norleen SAILOR, MD  fexofenadine (ALLEGRA) 180 MG tablet Take 180 mg by mouth daily.    [provider]  fluticasone  (FLONASE ) 50 MCG/ACT nasal spray Place 2 sprays into both nostrils once daily. Patient not taking: Reported on 02/09/2024 05/18/23     fluticasone  (VERAMYST) 27.5 MCG/SPRAY nasal spray Place 2 sprays into the nose daily.    [provider]  latanoprost  (XALATAN ) 0.005 % ophthalmic solution Place 1 drop into both eyes every night  10/27/22     latanoprost  (XALATAN ) 0.005 % ophthalmic solution Place 1 drop into both eyes at bedtime. 10/04/23     losartan  (COZAAR ) 25 MG tablet Take 25 mg by mouth daily.    [provider]  losartan  (COZAAR ) 25 MG tablet Take 1 tablet (25 mg total) by mouth daily. 03/25/23     losartan  (COZAAR ) 25 MG tablet Take 1 tablet (25 mg total) by mouth daily. 03/25/23     Multiple Vitamins-Minerals (MULTIVITAMIN WITH MINERALS) tablet Take 1 tablet by mouth daily.    [provider]  mupirocin  cream (BACTROBAN ) 2 % Apply in nose twice a day for 7 days Patient not  taking: Reported on 02/09/2024 11/17/23     ONETOUCH VERIO test strip  10/04/15   [provider]  Specialty Vitamins Products (MAGNESIUM , AMINO ACID CHELATE,) 133 MG tablet Take 1 tablet by mouth. 250mg     [provider]  timolol  (TIMOPTIC ) 0.5 % ophthalmic solution Place 1 drop into the left eye 2 (two) times daily. 10/27/22     timolol  (TIMOPTIC ) 0.5 % ophthalmic solution Place 1 drop into the left eye 2 (two) times daily. 10/04/23     tirzepatide  (MOUNJARO ) 5 MG/0.5ML Pen Inject 5 mg into the skin once a week. 12/06/23     traMADol  (ULTRAM ) 50 MG tablet Take 2 tablets (100 mg total) by mouth 3 (three) times daily as needed. 12/17/23     Vibegron  (GEMTESA ) 75 MG TABS Take 1 tablet by mouth daily. 08/12/21   Odean Potts, MD    Family History History reviewed. No pertinent family history.  Social History Social History   Tobacco Use   Smoking status: Never   Smokeless tobacco: Never  Substance Use Topics   Alcohol use: No   Drug use: No     Allergies   Horse-derived products, Linagliptin, Lisinopril , and Saxagliptin-metformin  er   Review of Systems Review of Systems  Constitutional:  Positive for activity change. Negative for appetite change, fatigue and fever.  Gastrointestinal:  Negative for abdominal pain, diarrhea, nausea and vomiting.  Musculoskeletal:  Positive for arthralgias. Negative for  myalgias.  Skin:  Negative for rash and wound.  Neurological:  Negative for weakness and numbness.     Physical Exam Triage Vital Signs ED Triage Vitals  Encounter Vitals Group     BP 03/17/24 1153 114/65     Girls Systolic BP Percentile --      Girls Diastolic BP Percentile --      Boys Systolic BP Percentile --      Boys Diastolic BP Percentile --      Pulse Rate 03/17/24 1153 94     Resp 03/17/24 1153 20     Temp 03/17/24 1153 98.5 F (36.9 C)     Temp Source 03/17/24 1153 Oral     SpO2 03/17/24 1153 96 %     Weight --      Height --      Head Circumference --      Peak Flow --      Pain Score 03/17/24 1150 8     Pain Loc --      Pain Education --      Exclude from Growth Chart --    No data found.  Updated Vital Signs BP 114/65 (BP Location: Left Arm)   Pulse 94   Temp 98.5 F (36.9 C) (Oral)   Resp 20   SpO2 96%   Visual Acuity Right Eye Distance:   Left Eye Distance:   Bilateral Distance:    Right Eye Near:   Left Eye Near:    Bilateral Near:     Physical Exam Vitals reviewed.  Constitutional:      General: She is awake. She is not in acute distress.    Appearance: Normal appearance. She is well-developed. She is not ill-appearing.     Comments: Very pleasant female appears stated age in no acute distress sitting comfortably in exam room  HENT:     Head: Normocephalic and atraumatic.  Cardiovascular:     Rate and Rhythm: Normal rate and regular rhythm.     Heart sounds: S1 normal and S2 normal.  Murmur heard.     Comments: Capillary refill within 2 seconds right fingers Pulmonary:     Effort: Pulmonary effort is normal.     Breath sounds: Normal breath sounds. No wheezing, rhonchi or rales.     Comments: Clear to auscultation bilaterally Musculoskeletal:     Right shoulder: Tenderness present. No swelling or bony tenderness. Decreased range of motion. Decreased strength.     Cervical back: No tenderness or bony tenderness.     Thoracic back: No  tenderness or bony tenderness.     Lumbar back: No tenderness or bony tenderness.     Comments: Right shoulder/arm: Tenderness palpation over AC joint without deformity.  Decreased range of motion with flexion, abduction, internal and external rotation.  Patient is unable to perform special test due to severity of pain.  Hand is neurovascularly intact.  Psychiatric:        Behavior: Behavior is cooperative.      UC Treatments / Results  Labs (all labs ordered are listed, but only abnormal results are displayed) Labs Reviewed - No data to display  EKG   Radiology DG Shoulder Right Result Date: 03/17/2024 EXAM: 1 VIEW XRAY OF THE RIGHT SHOULDER 03/17/2024 12:16:35 PM COMPARISON: None available. CLINICAL HISTORY: Pain and decreased range of motion. FINDINGS: BONES AND JOINTS: Glenohumeral joint is normally aligned. No acute fracture or dislocation. The [facility] joint is unremarkable in appearance. Degenerative changes in lower cervical and thoracic spine. 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 Atherosclerotic calcification in aortic arch. Surgical clips right lateral chest wall. Visualized lung is unremarkable. IMPRESSION: 1. 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 Electronically signed by: Dayne Hassell MD 03/17/2024 12:37 PM EDT RP Workstation: [id number]    Procedures Procedures (including critical care time)  Medications Ordered in UC Medications - No data to display  Initial Impression / Assessment and Plan / UC Course  I have reviewed the triage vital signs and the nursing notes.  Pertinent labs & imaging results that were available during my care of the patient were reviewed by me and considered in my medical decision making (see chart for details).     Patient is well-appearing, afebrile, nontoxic, nontachycardic.  X-ray was obtained given bony tenderness that showed calcific tendinosis without acute abnormality.  She has already been taking tramadol  as  well as ibuprofen  without improvement of symptoms.  We discussed that she can continue this medication but because of her age and comorbidities we are limited in the other medications that can be used.  Will apply lidocaine  patch in hopes this provides additional symptom relief and recommend close follow-up with orthopedics.  She is established with [facility] and was encouraged to call them to schedule appointment ASAP for additional evaluation and management.  We discussed that if she has any worsening or changing symptoms she needs to be seen immediately.  Strict return precautions given.  All questions answered patient satisfaction.  Final Clinical Impressions(s) / UC Diagnoses   Final diagnoses:  Acute pain of right shoulder  Right rotator cuff tendonitis     Discharge Instructions      Your x-ray showed some calcium  around your shoulder joint but nothing was broken or out of place.  I am concerned about your rotator cuff and like you to follow-up with orthopedics.  Please call them to schedule an appointment soon as possible.  Continue Tylenol  over-the-counter and you can also use lidocaine  patch.  Apply 1 patch during morning and wear all day (12 hours) and  then remove it at night.  Use only 1 patch for 24 hours.  If you have any worsening or changing symptoms please return for reevaluation.     ED Prescriptions     Medication Sig Dispense Auth. Provider   lidocaine  (LIDODERM ) 5 % Place 1 patch onto the skin daily. Remove & Discard patch within 12 hours or as directed by MD 10 patch Lukus Binion K, PA-C      PDMP not reviewed this encounter.   Sherrell Rocky POUR, PA-C 03/17/24 1313

## 2024-03-20 ENCOUNTER — Other Ambulatory Visit: Payer: Self-pay | Admitting: Urology

## 2024-03-20 ENCOUNTER — Other Ambulatory Visit (HOSPITAL_COMMUNITY): Payer: Self-pay

## 2024-03-20 DIAGNOSIS — M7581 Other shoulder lesions, right shoulder: Secondary | ICD-10-CM | POA: Diagnosis not present

## 2024-03-20 DIAGNOSIS — M7521 Bicipital tendinitis, right shoulder: Secondary | ICD-10-CM | POA: Diagnosis not present

## 2024-03-21 ENCOUNTER — Other Ambulatory Visit (HOSPITAL_COMMUNITY): Payer: Self-pay

## 2024-03-21 ENCOUNTER — Encounter (HOSPITAL_COMMUNITY): Payer: Self-pay

## 2024-03-21 MED ORDER — GEMTESA 75 MG PO TABS
75.0000 mg | ORAL_TABLET | Freq: Every day | ORAL | 5 refills | Status: AC
  Filled 2024-03-21: qty 30, 30d supply, fill #0
  Filled 2024-04-17: qty 30, 30d supply, fill #1
  Filled 2024-05-22: qty 30, 30d supply, fill #2
  Filled 2024-06-19: qty 30, 30d supply, fill #3

## 2024-03-22 DIAGNOSIS — H35342 Macular cyst, hole, or pseudohole, left eye: Secondary | ICD-10-CM | POA: Diagnosis not present

## 2024-03-22 DIAGNOSIS — H401132 Primary open-angle glaucoma, bilateral, moderate stage: Secondary | ICD-10-CM | POA: Diagnosis not present

## 2024-03-22 DIAGNOSIS — E113293 Type 2 diabetes mellitus with mild nonproliferative diabetic retinopathy without macular edema, bilateral: Secondary | ICD-10-CM | POA: Diagnosis not present

## 2024-03-22 DIAGNOSIS — H35033 Hypertensive retinopathy, bilateral: Secondary | ICD-10-CM | POA: Diagnosis not present

## 2024-03-23 ENCOUNTER — Other Ambulatory Visit (HOSPITAL_COMMUNITY): Payer: Self-pay

## 2024-03-30 ENCOUNTER — Other Ambulatory Visit (HOSPITAL_COMMUNITY): Payer: Self-pay

## 2024-03-31 ENCOUNTER — Other Ambulatory Visit (HOSPITAL_COMMUNITY): Payer: Self-pay

## 2024-03-31 MED ORDER — MOUNJARO 5 MG/0.5ML ~~LOC~~ SOAJ
5.0000 mg | SUBCUTANEOUS | 0 refills | Status: AC
  Filled 2024-03-31: qty 6, 84d supply, fill #0

## 2024-04-03 ENCOUNTER — Other Ambulatory Visit (HOSPITAL_COMMUNITY): Payer: Self-pay

## 2024-04-03 MED ORDER — MOUNJARO 5 MG/0.5ML ~~LOC~~ SOAJ
5.0000 mg | SUBCUTANEOUS | 2 refills | Status: AC
  Filled 2024-04-03 – 2024-07-07 (×3): qty 6, 84d supply, fill #0

## 2024-04-10 ENCOUNTER — Other Ambulatory Visit: Payer: Self-pay

## 2024-04-17 ENCOUNTER — Other Ambulatory Visit (HOSPITAL_COMMUNITY): Payer: Self-pay

## 2024-04-27 ENCOUNTER — Other Ambulatory Visit (HOSPITAL_COMMUNITY): Payer: Self-pay

## 2024-05-02 ENCOUNTER — Other Ambulatory Visit: Payer: Self-pay | Admitting: General Surgery

## 2024-05-02 DIAGNOSIS — Z9889 Other specified postprocedural states: Secondary | ICD-10-CM

## 2024-05-10 ENCOUNTER — Encounter: Payer: Self-pay | Admitting: Podiatry

## 2024-05-10 ENCOUNTER — Ambulatory Visit: Admitting: Podiatry

## 2024-05-10 DIAGNOSIS — M79675 Pain in left toe(s): Secondary | ICD-10-CM | POA: Diagnosis not present

## 2024-05-10 DIAGNOSIS — B351 Tinea unguium: Secondary | ICD-10-CM | POA: Diagnosis not present

## 2024-05-10 DIAGNOSIS — M79674 Pain in right toe(s): Secondary | ICD-10-CM | POA: Diagnosis not present

## 2024-05-10 DIAGNOSIS — E1142 Type 2 diabetes mellitus with diabetic polyneuropathy: Secondary | ICD-10-CM

## 2024-05-10 NOTE — Progress Notes (Signed)
 This patient returns to my office for at risk foot care.  This patient requires this care by a professional since this patient will be at risk due to having diabetes.  This patient is unable to cut nails herself since the patient cannot reach her nails.These nails are painful walking and wearing shoes.  This patient presents for at risk foot care today.  General Appearance  Alert, conversant and in no acute stress.  Vascular  Dorsalis pedis and posterior tibial  pulses are palpable  bilaterally.  Capillary return is within normal limits  bilaterally. Temperature is within normal limits  bilaterally.  Neurologic  Senn-Weinstein monofilament wire test within normal limits  bilaterally. Muscle power within normal limits bilaterally.  Nails Thick disfigured discolored nails with subungual debris  from hallux to fifth toes bilaterally. No evidence of bacterial infection or drainage bilaterally.  Orthopedic  No limitations of motion  feet .  No crepitus or effusions noted.  Severe HAV  B/L with hammer toes.  Skin  normotropic skin with no porokeratosis noted bilaterally.  No signs of infections or ulcers noted.     Onychomycosis  Pain in right toes  Pain in left toes  Consent was obtained for treatment procedures.   Mechanical debridement of nails 1-5  bilaterally performed with a nail nipper.  Filed with dremel without incident.    Return office visit    3 months                  Told patient to return for periodic foot care and evaluation due to potential at risk complications.   Cordella Bold DPM

## 2024-05-11 DIAGNOSIS — E785 Hyperlipidemia, unspecified: Secondary | ICD-10-CM | POA: Diagnosis not present

## 2024-05-11 DIAGNOSIS — E559 Vitamin D deficiency, unspecified: Secondary | ICD-10-CM | POA: Diagnosis not present

## 2024-05-11 DIAGNOSIS — R946 Abnormal results of thyroid function studies: Secondary | ICD-10-CM | POA: Diagnosis not present

## 2024-05-11 DIAGNOSIS — I10 Essential (primary) hypertension: Secondary | ICD-10-CM | POA: Diagnosis not present

## 2024-05-18 ENCOUNTER — Other Ambulatory Visit (HOSPITAL_COMMUNITY): Payer: Self-pay

## 2024-05-18 MED ORDER — FREESTYLE LIBRE 3 SENSOR MISC
1.0000 | 98 refills | Status: AC
  Filled 2024-05-18: qty 6, 84d supply, fill #0

## 2024-06-12 ENCOUNTER — Ambulatory Visit
Admission: RE | Admit: 2024-06-12 | Discharge: 2024-06-12 | Disposition: A | Discharge status: Home / Self Care | Source: Ambulatory Visit | Attending: General Surgery | Admitting: General Surgery

## 2024-06-12 DIAGNOSIS — Z9889 Other specified postprocedural states: Secondary | ICD-10-CM

## 2024-06-13 ENCOUNTER — Other Ambulatory Visit (HOSPITAL_COMMUNITY): Payer: Self-pay

## 2024-06-14 ENCOUNTER — Other Ambulatory Visit (HOSPITAL_COMMUNITY): Payer: Self-pay

## 2024-06-14 MED ORDER — LOSARTAN POTASSIUM 25 MG PO TABS
25.0000 mg | ORAL_TABLET | Freq: Every day | ORAL | 3 refills | Status: AC
  Filled 2024-06-14: qty 90, 90d supply, fill #0

## 2024-06-19 ENCOUNTER — Other Ambulatory Visit (HOSPITAL_COMMUNITY): Payer: Self-pay

## 2024-06-27 ENCOUNTER — Other Ambulatory Visit (HOSPITAL_COMMUNITY): Payer: Self-pay

## 2024-07-07 ENCOUNTER — Other Ambulatory Visit (HOSPITAL_COMMUNITY): Payer: Self-pay

## 2024-07-12 ENCOUNTER — Other Ambulatory Visit (HOSPITAL_COMMUNITY): Payer: Self-pay

## 2024-07-13 ENCOUNTER — Other Ambulatory Visit (HOSPITAL_COMMUNITY): Payer: Self-pay

## 2024-08-08 ENCOUNTER — Ambulatory Visit: Admitting: Podiatry

## 2024-08-22 ENCOUNTER — Inpatient Hospital Stay

## 2024-08-22 ENCOUNTER — Encounter: Admitting: Adult Health
